# Patient Record
Sex: Female | Born: 1955 | Race: White | Hispanic: No | Marital: Married | Smoking: Current every day smoker
Health system: Southern US, Community
[De-identification: ages and names within clinical notes are randomized; demographics above are authoritative.]

## PROBLEM LIST (undated history)

## (undated) DIAGNOSIS — F319 Bipolar disorder, unspecified: Secondary | ICD-10-CM

## (undated) DIAGNOSIS — I509 Heart failure, unspecified: Secondary | ICD-10-CM

## (undated) DIAGNOSIS — I739 Peripheral vascular disease, unspecified: Secondary | ICD-10-CM

## (undated) DIAGNOSIS — I779 Disorder of arteries and arterioles, unspecified: Secondary | ICD-10-CM

## (undated) DIAGNOSIS — M1990 Unspecified osteoarthritis, unspecified site: Secondary | ICD-10-CM

## (undated) DIAGNOSIS — R001 Bradycardia, unspecified: Secondary | ICD-10-CM

## (undated) DIAGNOSIS — T148XXA Other injury of unspecified body region, initial encounter: Secondary | ICD-10-CM

## (undated) DIAGNOSIS — I10 Essential (primary) hypertension: Secondary | ICD-10-CM

## (undated) DIAGNOSIS — I639 Cerebral infarction, unspecified: Secondary | ICD-10-CM

## (undated) DIAGNOSIS — I517 Cardiomegaly: Secondary | ICD-10-CM

## (undated) DIAGNOSIS — R011 Cardiac murmur, unspecified: Secondary | ICD-10-CM

## (undated) DIAGNOSIS — Z950 Presence of cardiac pacemaker: Secondary | ICD-10-CM

## (undated) DIAGNOSIS — R928 Other abnormal and inconclusive findings on diagnostic imaging of breast: Secondary | ICD-10-CM

## (undated) DIAGNOSIS — Z1231 Encounter for screening mammogram for malignant neoplasm of breast: Secondary | ICD-10-CM

## (undated) DIAGNOSIS — Z780 Asymptomatic menopausal state: Secondary | ICD-10-CM

## (undated) DIAGNOSIS — Z13820 Encounter for screening for osteoporosis: Secondary | ICD-10-CM

## (undated) DIAGNOSIS — M810 Age-related osteoporosis without current pathological fracture: Secondary | ICD-10-CM

## (undated) DIAGNOSIS — F17210 Nicotine dependence, cigarettes, uncomplicated: Secondary | ICD-10-CM

## (undated) DIAGNOSIS — M25512 Pain in left shoulder: Principal | ICD-10-CM

## (undated) HISTORY — DX: Bradycardia, unspecified: R00.1

## (undated) HISTORY — DX: Cardiac murmur, unspecified: R01.1

## (undated) HISTORY — DX: Essential (primary) hypertension: I10

## (undated) HISTORY — DX: Disorder of arteries and arterioles, unspecified: I77.9

## (undated) HISTORY — DX: Bipolar disorder, unspecified: F31.9

## (undated) HISTORY — DX: Peripheral vascular disease, unspecified: I73.9

## (undated) HISTORY — DX: Unspecified osteoarthritis, unspecified site: M19.90

## (undated) HISTORY — DX: Other injury of unspecified body region, initial encounter: T14.8XXA

## (undated) HISTORY — DX: Presence of cardiac pacemaker: Z95.0

## (undated) HISTORY — DX: Cerebral infarction, unspecified: I63.9

## (undated) HISTORY — DX: Cardiomegaly: I51.7

---

## 1978-08-15 HISTORY — PX: CHOLECYSTECTOMY: SHX55

## 1981-08-15 HISTORY — PX: APPENDECTOMY: SHX54

## 1998-03-02 ENCOUNTER — Other Ambulatory Visit: Admission: RE | Admit: 1998-03-02 | Discharge: 1998-03-02 | Payer: Self-pay | Admitting: Family Medicine

## 1998-03-02 ENCOUNTER — Other Ambulatory Visit: Admission: RE | Admit: 1998-03-02 | Discharge: 1998-03-02 | Payer: Self-pay | Admitting: *Deleted

## 1998-05-17 ENCOUNTER — Emergency Department (HOSPITAL_COMMUNITY): Admission: EM | Admit: 1998-05-17 | Discharge: 1998-05-17 | Payer: Self-pay | Admitting: Emergency Medicine

## 1998-11-07 ENCOUNTER — Emergency Department (HOSPITAL_COMMUNITY): Admission: EM | Admit: 1998-11-07 | Discharge: 1998-11-07 | Payer: Self-pay | Admitting: Emergency Medicine

## 1998-11-07 ENCOUNTER — Encounter: Payer: Self-pay | Admitting: Emergency Medicine

## 1999-04-01 ENCOUNTER — Other Ambulatory Visit: Admission: RE | Admit: 1999-04-01 | Discharge: 1999-04-01 | Payer: Self-pay | Admitting: *Deleted

## 2000-03-17 ENCOUNTER — Emergency Department (HOSPITAL_COMMUNITY): Admission: EM | Admit: 2000-03-17 | Discharge: 2000-03-17 | Payer: Self-pay | Admitting: Emergency Medicine

## 2000-04-24 ENCOUNTER — Other Ambulatory Visit: Admission: RE | Admit: 2000-04-24 | Discharge: 2000-04-24 | Payer: Self-pay | Admitting: *Deleted

## 2000-07-10 ENCOUNTER — Ambulatory Visit (HOSPITAL_COMMUNITY): Admission: RE | Admit: 2000-07-10 | Discharge: 2000-07-10 | Payer: Self-pay | Admitting: Orthopedic Surgery

## 2000-07-10 ENCOUNTER — Encounter: Payer: Self-pay | Admitting: Orthopedic Surgery

## 2000-07-20 ENCOUNTER — Ambulatory Visit (HOSPITAL_COMMUNITY): Admission: RE | Admit: 2000-07-20 | Discharge: 2000-07-20 | Payer: Self-pay | Admitting: Orthopedic Surgery

## 2000-07-20 ENCOUNTER — Encounter: Payer: Self-pay | Admitting: Orthopedic Surgery

## 2001-03-07 ENCOUNTER — Ambulatory Visit (HOSPITAL_COMMUNITY): Admission: RE | Admit: 2001-03-07 | Discharge: 2001-03-07 | Payer: Self-pay | Admitting: Gastroenterology

## 2001-03-07 ENCOUNTER — Encounter (INDEPENDENT_AMBULATORY_CARE_PROVIDER_SITE_OTHER): Payer: Self-pay | Admitting: *Deleted

## 2001-06-11 ENCOUNTER — Other Ambulatory Visit: Admission: RE | Admit: 2001-06-11 | Discharge: 2001-06-11 | Payer: Self-pay | Admitting: Obstetrics and Gynecology

## 2001-08-15 HISTORY — PX: TOTAL KNEE ARTHROPLASTY: SHX125

## 2002-02-20 ENCOUNTER — Encounter: Payer: Self-pay | Admitting: Orthopedic Surgery

## 2002-02-25 ENCOUNTER — Inpatient Hospital Stay (HOSPITAL_COMMUNITY): Admission: RE | Admit: 2002-02-25 | Discharge: 2002-02-28 | Payer: Self-pay | Admitting: Orthopedic Surgery

## 2002-03-07 ENCOUNTER — Encounter: Admission: RE | Admit: 2002-03-07 | Discharge: 2002-03-07 | Payer: Self-pay | Admitting: Orthopedic Surgery

## 2002-03-07 ENCOUNTER — Encounter: Payer: Self-pay | Admitting: Orthopedic Surgery

## 2002-04-11 ENCOUNTER — Encounter: Admission: RE | Admit: 2002-04-11 | Discharge: 2002-05-06 | Payer: Self-pay | Admitting: Orthopedic Surgery

## 2003-03-20 ENCOUNTER — Other Ambulatory Visit: Admission: RE | Admit: 2003-03-20 | Discharge: 2003-03-20 | Payer: Self-pay | Admitting: Obstetrics & Gynecology

## 2004-01-03 ENCOUNTER — Ambulatory Visit (HOSPITAL_COMMUNITY): Admission: RE | Admit: 2004-01-03 | Discharge: 2004-01-03 | Payer: Self-pay | Admitting: Orthopedic Surgery

## 2004-03-24 ENCOUNTER — Ambulatory Visit (HOSPITAL_COMMUNITY): Admission: RE | Admit: 2004-03-24 | Discharge: 2004-03-24 | Payer: Self-pay | Admitting: Neurosurgery

## 2004-05-17 ENCOUNTER — Encounter: Admission: RE | Admit: 2004-05-17 | Discharge: 2004-07-05 | Payer: Self-pay | Admitting: Orthopedic Surgery

## 2004-09-10 ENCOUNTER — Encounter (INDEPENDENT_AMBULATORY_CARE_PROVIDER_SITE_OTHER): Payer: Self-pay | Admitting: *Deleted

## 2004-09-10 ENCOUNTER — Ambulatory Visit (HOSPITAL_COMMUNITY): Admission: RE | Admit: 2004-09-10 | Discharge: 2004-09-10 | Payer: Self-pay | Admitting: Gastroenterology

## 2004-10-28 ENCOUNTER — Inpatient Hospital Stay (HOSPITAL_COMMUNITY): Admission: EM | Admit: 2004-10-28 | Discharge: 2004-10-30 | Payer: Self-pay | Admitting: Emergency Medicine

## 2004-10-29 ENCOUNTER — Ambulatory Visit: Payer: Self-pay | Admitting: Internal Medicine

## 2004-10-29 ENCOUNTER — Encounter: Payer: Self-pay | Admitting: Internal Medicine

## 2005-01-01 HISTORY — PX: PERMANENT PACEMAKER INSERTION: SHX6023

## 2005-01-05 ENCOUNTER — Inpatient Hospital Stay (HOSPITAL_COMMUNITY): Admission: EM | Admit: 2005-01-05 | Discharge: 2005-01-08 | Payer: Self-pay | Admitting: Emergency Medicine

## 2005-01-06 HISTORY — PX: TEE WITHOUT CARDIOVERSION: SHX5443

## 2005-01-26 ENCOUNTER — Encounter: Admission: RE | Admit: 2005-01-26 | Discharge: 2005-01-26 | Payer: Self-pay | Admitting: *Deleted

## 2005-02-01 ENCOUNTER — Inpatient Hospital Stay (HOSPITAL_COMMUNITY): Admission: RE | Admit: 2005-02-01 | Discharge: 2005-02-02 | Payer: Self-pay | Admitting: *Deleted

## 2005-11-08 ENCOUNTER — Ambulatory Visit (HOSPITAL_COMMUNITY): Admission: RE | Admit: 2005-11-08 | Discharge: 2005-11-08 | Payer: Self-pay | Admitting: Gastroenterology

## 2007-11-07 ENCOUNTER — Emergency Department (HOSPITAL_COMMUNITY): Admission: EM | Admit: 2007-11-07 | Discharge: 2007-11-07 | Payer: Self-pay | Admitting: Emergency Medicine

## 2007-12-07 ENCOUNTER — Encounter: Admission: RE | Admit: 2007-12-07 | Discharge: 2007-12-07 | Payer: Self-pay | Admitting: Neurology

## 2008-09-15 DEATH — deceased

## 2010-12-31 NOTE — Discharge Summary (Signed)
NAMEMarland Kitchen  TEANNA, ELEM                ACCOUNT NO.:  [id number]   MEDICAL RECORD NO.:  [id number]          PATIENT TYPE:  INP   LOCATION:  3005                         FACILITY:  [facility]   PHYSICIAN:  Casimiro Needle L. Reynolds, M.D.DATE OF BIRTH:  [birth year]   DATE OF ADMISSION:  10/28/2004  DATE OF DISCHARGE:  10/30/2004                                 DISCHARGE SUMMARY   ADMISSION DIAGNOSES:  1.  Left hemi-numbness, dizziness, headache, possible stroke versus      migraine.  2.  Bipolar disorder.   DISCHARGE DIAGNOSES:  1.  Left-sided numbness and headache with negative magnetic resonance      imaging, most likely migraine versus possible tiny lacunar infarct      improved.  2.  Hypertension.  3.  Hyperhomocysteinemia.  4.  Bipolar disorder.   CONDITION ON DISCHARGE:  Improved.   DIET:  Low salt, low fat, low cholesterol.   ACTIVITY:  Ad lib.   MEDICATIONS ON DISCHARGE:  1.  Aspirin 325 mg daily (new).  2.  Norvasc 5 mg p.o. daily (new).  3.  Foltx one tablet a day (new).  4.  Lithium carbonate 300 mg p.o. b.i.d.  5.  Valium 2 mg q.8h. p.r.n.   STUDIES:  1.  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  2.  MRA of the intracranial circulation demonstrating mild to moderate      atherosclerotic changes, but no critical stenoses.  3.  MRA of the extracranial circulation demonstrating no significant lesions      in the carotid and vertebral arteries.  4.  A 2-D echocardiogram unremarkable.  5.  Laboratory review:  CBC remarkable for a mildly low hemoglobin and      hematocrit of 11.8 and 34.7, respectively.  Otherwise normal.  CMET      remarkable for a mildly low total protein and albumin, otherwise normal.      Lithium level 0.32.  Hemoglobin A1C 5.4.  Homocysteine elevated at      50.29.  Lipid panel with low HDL 32, elevated triglycerides, calculated      LDL in the normal range  at 76.  Telemetry demonstrates sinus      bradycardia.  EKG:  Sinus bradycardia, otherwise unremarkable.   HOSPITAL COURSE:  Please see admission H&P for full admission details.  Briefly, this is a 55 year old woman, who presented to the emergency room on  March 16 with hemi-numbness and also with a hemi-sensory deficit and was  admitted by Dr. Anne Hahn for concern of a possible stroke.  She was admitted  for a routine stroke workup which was done with the results as identified  above.  The patient's symptoms were improved while she was in the hospital  and by the morning of October 30, 2004 she had no significant complaints.  She  had a normal neurological examination.  She was placed on aspirin and  tolerated this well.  Blood pressures were elevated throughout her stay in  the  160-180 systolic range and she was initially placed on Altace which was  changed to Norvasc at discharge.  Heart rates were low throughout the  hospital stay, but she was not symptomatic with this.  She had no difficulty  with her bipolar disorder.  Her primary physician is Dr. Tiburcio Pea, but she  wished to change this at the time of discharge, so arrangements will be made  to set her up in the internal medicine residents' clinic.  On the basis of  the results of her stroke workup, she was also started on Foltx and  continued on aspirin and antihypertensive medications.  She was felt stable  for discharge home and was discharged on October 30, 2004.   FOLLOWUP:  She was advised to follow up with the internal medicine  residents' clinic in approximately one month and have asked her to call  [facility] at [phone] for an appointment with Dr. Pearlean Brownie  in two months.      MLR/MEDQ  D:  10/30/2004  T:  10/31/2004  Job:  [id number]

## 2010-12-31 NOTE — Op Note (Signed)
[place]  Patient:    Kellie Velez, Kellie Velez Visit Number: [id number] MRN: [id number]          Service Type: SUR Location: [id number] Attending Physician:  Georgena Spurling Dictated by:   Georgena Spurling, M.D. Proc. Date: 02/25/02 Admit Date:  02/25/2002                             Operative Report  SURGEON:  Georgena Spurling, M.D.  ASSISTANT:  Jamelle Rushing, P.A.  PREOPERATIVE DIAGNOSIS:  Left knee osteoarthritis.  POSTOPERATIVE DIAGNOSIS:  Left knee osteoarthritis.  PROCEDURE:  Left total knee arthroplasty.  ANESTHESIA:  General.  INDICATIONS FOR PROCEDURE: The patient is a 55 year old white female, status post failure of conservative measures and other treatment for osteoarthritis of the knee.  Informed consent was obtained.  DESCRIPTION OF PROCEDURE:  The patient was lain supine and administered general endotracheal anesthesia, and Foley catheter placement in the supine position. The left knee was then prepped and draped in the usual sterile fashion and the extremities exsanguinated and tourniquet elevated to 350 mmHg. A #10 blade was used to make a midline incision, and then a fresh blade was used to make a straight medium parapatellar arthrotomy.  I then dissected off the deep MCL of the proximal medial tibia and inverted the patella.  I performed a synovectomy in the suprapatellar pouch.  I measured the patella to be 22 mm.  I used a 29 mm reamer to ream down to 13.  I then used our 29 template and drilled three lug holes and with prosthetic trial in place, it also measured 22 mm.  I then went into flexion and cut the ACL and PCL so I could deliver the tibia anteriorly, and used an extramedullary alignment guide to pin the cutting block into place.  I then made a cut perpendicular to the anatomic axis of the tibia, removing 2 mm of bone off the medial side.  At this point, I turned attention to the femur where I made an intramedullary hole  and placed our intramedullary alignment guide and tamped it down to the distal aspect of the femur and placed the distal femoral cutting block and pinned it into place and removed our intramedullary alignment rod.  I then used the sagittal saw to make the distal femoral cut.  I then removed the distal femoral cutting guide.  I then measured the posterior columnar angle to be 3 degrees and used the sizing block which sized to a size D and pinned it through the 3-degree holes.  I then removed the sizing block and placed the cutter on and screwed it into place, removed the headless pins and made our anterior and posterior and chamfer cuts.  I then placed a laminar spreader in the lateral compartment and removed the medial meniscus, the posteromedial osteophytes, and ACL and PCL.  I then placed the lamina spreader in the medical compartment and removed the lateral meniscus, posterolateral columnar osteophytes, and further stripped the capsule. At this point, I freshened up the tibial cut posteriorly where there were some osteophytes and removed those osteophytes and used a 10 mm spacer block, which offered excellent flexion and extension gap balance.  I then finished the femur with a size D finishing block.  I finished the tibia with a size 4 tibial tray and then trial with a size 4 tibial tray and sized the femur, 10 mm polyethylene and  29 mm patella. We had excellent flexion and extension gap balance.  There was a slight lift off of the patella, and I did a very small lateral release, which corrected that.  At this point, I removed the trials and irrigated copiously and then submitted the tibia first, femur second, patella third, and then put the real polyethylene in place once we had removed all excess cement and let the cement harden in extension.  I then let the tourniquet down and cauterized all bleeding vessels.  I placed the Hemovac deep to the arthrotomy and closed the arthrotomy with  interrupted #1 Vicryl sutures.  I closed the deep soft tissues with interrupted 0 Vicryls and then a running subcuticular 2-0 Vicryl and skin staples.  The patient tolerated the procedure well.  COMPLICATIONS: None.  DRAINS:  None. Dictated by:   Georgena Spurling, M.D. Attending Physician:  Georgena Spurling DD:  02/25/02 TD:  02/26/02 Job: [id number]

## 2010-12-31 NOTE — H&P (Signed)
NAME:  Kellie Velez, Kellie Velez NO.:  [id number]   MEDICAL RECORD NO.:  [id number]          PATIENT TYPE:  INP   LOCATION:  1824                         FACILITY:  [facility]   PHYSICIAN:  Marlan Palau, M.D.  DATE OF BIRTH:  08/10/1956   DATE OF ADMISSION:  10/28/2004  DATE OF DISCHARGE:                                HISTORY & PHYSICAL   HISTORY OF PRESENT ILLNESS:  Kellie Velez is a 55 year old, right handed,  white female, born 1956-01-29, with a history of obesity, bipolar  disorder, migraine headache.  This patient comes to [facility] at  this point after noting onset of some problems with swelling in the feet  beginning about 4-5 days ago.  Within the last two days, the patient has  developed an occipital headache associated with dizziness that she describes  as a blend between vertigo and lightheaded floating feeling.  The patient  has had nausea but no vomiting with this.  Along with this, the patient has  been staggering, feels at times as if the left leg is going to give way.  The patient today noted onset of severe dizziness and onset of left sided  sensory changes involving the face, arm, and leg.  The patient became  concerned and sought medical attention.  The patient denied any visual  changes such as double vision, loss of vision, denied any changes in speech  or swallowing, although chronically she notes that if she tilts her head  back to swallow something she may choke with this maneuver.  The patient  again has not had any slurred speech or aphasia symptoms.  The patient does  not really believe there has been any true weakness of the extremities.  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  No acute changes are seen.  Neurology is asked to see this patient for  further evaluation.   PAST MEDICAL HISTORY:  1.  New onset  dizziness, gait instability, left hemi sensory deficit as      above.  2.  History of migraine.  3.  Bipolar disorder.  4.  History of obesity.  5.  Irritable bowel syndrome.  6.  Hiatal hernia.  7.  Ulcerative colitis.  8.  Gout.  9.  Gallbladder resection.  10. Appendectomy.  11. Status post left total knee replacement.  12. History of left knee arthroscopic surgery in the past.  13. History of left shoulder surgery.  14. History of right carpal tunnel syndrome surgery.  15. History of cervical spondylosis, followed by Dr. Franky Macho.   CURRENT MEDICATIONS:  1.  Diazepam 10 mg three times a day if needed.  2.  Lithium 300 mg b.i.d.   The patient smokes a half pack of cigarettes daily.  Does not drink alcohol.  The patient reports no history of hypertension but comes into the ER today  with a blood pressure of 204/78.   No known allergies.  SOCIAL HISTORY:  The patient is married, lives in the [place] area, has no children, is on disability.   FAMILY MEDICAL HISTORY:  Notable that mother died with an abdominal aortic  aneurysm.  Father died with pulmonary disease.  The patient has three  brothers that are living and one with heart disease, one with a problem with  hernia, obesity, heart disease.  One brother who is alive and well.  The  patient has one brother that died with pneumonia.   REVIEW OF SYSTEMS:  Noted for occasional choking as above, headache, slight  shortness of breath, denies chest pains, occasional heartburn is noted.  The  patient has some stress incontinence of the bladder, has episodes of near  syncope but no black out episodes.  No history of seizures.   PHYSICAL EXAMINATION:  VITAL SIGNS:  Blood pressure is initially 204/78,  repeat is 193/78, heart rate 53, respiratory rate 16, temperature afebrile.  GENERAL:  This patient is a markedly obese white female who is alert and  cooperative at the time of examination.  HEENT:  Head  is atraumatic.  Eyes, pupils are equal, round, reactive to  light.  Disks are flat bilaterally.  NECK:  Supple.  No carotid bruits noted.  RESPIRATORY:  Clear.  CARDIOVASCULAR:  Reveals a regular rate and rhythm.  No obvious murmurs rubs  noted.  EXTREMITIES:  Notable for 2 to 3+ edema.  ABDOMEN:  Reveals an obese abdomen.  No organomegaly or tenderness noted.  NEUROLOGIC:  Cranial nerves as above.  Facial symmetry is present.  The  patient notes decreased pinprick sensation on the left face compared to the  right.  Does not split the midline with vibratory sensation_  on the face.  Extraocular movements again are full.  Visual fields are full.  Speech is  well enunciated, not aphasic.  Motor testing reveals good strength on all  four extremities.  Good symmetric motor tone is noted throughout.  Sensory  testing reveals a decreased in pinprick sensation on the left arm left leg  as compared to the right.  Vibratory sensation is more symmetric.  The  patient has good finger-to-nose-finger and toe-to-finger bilaterally.  The  patient was not ambulated.  Deep tendon reflexes were relatively symmetric  throughout.  Toes were neutral bilaterally.  No drift is seen.   LABORATORY VALUES:  Notable for a white count of 7.6, hemoglobin of 11.8,  hematocrit of 34.7, MCV of 79.4, platelets of 202.  Sodium 138, potassium  4.1, chloride 109, CO2 of 25, glucose of 93, BUN of 7, creatinine 1.1,  calcium 8.9.  Total protein 5.9, albumin of 3.3, AST 18, ALT 17, alkaline  phosphatase 64, total bili 0.5.  Urinalysis reveals a specific gravity of  1.007, pH of 6.0, otherwise unremarkable.  Lithium level was 0.32.  A CT of  the head is as above.  Chest x-ray, EKG are pending at this time.   IMPRESSION:  1.  New onset of left hemi sensory deficit, dizziness, headache, rule out      cerebrovascular infarction or a complicated migraine.  2.  Bipolar disorder. 3.  Obesity.   This patient does have an  elevated blood pressure at this point but claims  to have no history of hypertension.  We will need to follow blood pressures,  may add Altace at low dose at this time.  The patient will be placed on  aspirin and followed during this hospitalization.  We  will admit for further  evaluation.   PLAN:  1.  MRI scan of the brain.  2.  MRA angiogram.  3.  A 2D echocardiogram.  4.  Aspirin therapy.  5.  Physical therapy for gout.  6.  Altace treatment.      CKW/MEDQ  D:  10/28/2004  T:  10/28/2004  Job:  [id number]   cc:   Juluis Mire, M.D.  [address]  [place]  [place] [address]  Fax: [phone]

## 2010-12-31 NOTE — Discharge Summary (Signed)
NAME:  Kellie Velez, Kellie Velez                            ACCOUNT NO.:  [id number]   MEDICAL RECORD NO.:  [id number]                   PATIENT TYPE:  REC   LOCATION:  [facility]H                                 FACILITY:  [facility]   PHYSICIAN:  Mila Homer. Sherlean Foot, M.D.              DATE OF BIRTH:  1956-02-06   DATE OF ADMISSION:  04/11/2002  DATE OF DISCHARGE:                                 DISCHARGE SUMMARY   DATE OF ADMISSION:  02/25/2002   DATE OF DISCHARGE:  02/28/2002   ADMISSION DIAGNOSES:  1. Left knee osteoarthritis.  2. Bipolar manic depression.  3. Ulcerative colitis.  4. Irritable bowel syndrome.  5. Hiatal hernia.   DISCHARGE DIAGNOSES:  1. Left total knee arthroplasty.  2. Postoperative blood loss anemia, asymptomatic.  3. Bipolar manic depression.  4. Ulcerative colitis.  5. Irritable bowel syndrome.  6. Hiatal hernia.   HISTORY OF PRESENT ILLNESS:  The patient is a 55 year old white female with  about a 15 year history of left knee problems starting with a torn  cartilage.  The patient has had four arthroscopic procedures with good short  term improvement but over the last few years the pain has been increasing  with activity.  The pain is currently severe with any type of range of  motion and weight bearing activity.  She does have night pain.  She  describes the pain as a sharp stabbing pain with radiation up and down the  leg.  She does have swelling, grinding, popping and giving out.  X-rays  revealed severe tricompartmental osteoarthritis.   ALLERGIES:  PENICILLIN AND DEMEROL.   CURRENT MEDICATIONS:  1. Lithium 300 mg p.o. t.i.d.  2. Darvocet p.r.n.  3. Alprazolam 0.25 mg p.o. q. 6 hours p.r.n.  4. Protonix 40 mg p.o. q.a.m.   SURGICAL PROCEDURE:  On 02/25/2002 the patient was taken to the operating  room by Dr. Georgena Spurling, assisted by Arlyn Leak, P.A.C.  Under general  anesthesia the patient underwent a left total knee arthroplasty.  The  patient tolerated the  procedure well.  One Hemovac drain was left in place.  There were no complications.  The patient received a postoperative femoral  nerve block for assistance in pain control.   HOSPITAL COURSE:  On 02/25/2002 the patient was admitted to [facility] under the care of Dr. Georgena Spurling.  The patient was taken to the  operating room where a left total knee arthroplasty was performed.  The  patient tolerated the procedure well, had a postoperative femoral nerve  block given and was placed on Lovenox for routine deep venous thrombosis  prophylaxis.   The patient then incurred a total of three days of postoperative care in  which he did develop some asymptomatic postoperative blood loss anemia and  did not require a transfusion.  The patient worked well with physical  therapy.  Her vital signs  remained stable.  She remained afebrile.  Her leg  remained neuromotor vascularly intact.  Her wound remained benign.  On  postoperative day number three the patient was bending her knee 0-85 degrees  and she was felt to be orthopaedically stable and ready for discharge to  home.  Arrangements were made and she was discharged in good condition.   LABORATORY DATA:  Electrocardiogram on admission was sinus bradycardia at 50  beats per minute.   02/28/2002.  WBC 9.6, hemoglobin 8.3, hematocrit 25.0, platelets 184,000.   02/27/2002.  Routine chemistries.  Sodium 137, potassium 3.8, glucose 145,  BUN 6, creatinine 0.8.   Routine urinalysis on admission was normal.   DISCHARGE MEDICATIONS FROM ORTHOPAEDIC FLOOR:  1. Colace 100 mg p.o. b.i.d.  2. Trinsicon one tablet p.o. t.i.d.  3. Lithium carbonate 300 mg p.o. t.i.d.  4. Protonix 40 mg p.o. q.d.  5. Lovenox 30 mg subcutaneously q. 12 hours.  6. Nicotine patch 21 mg per 24 hours.  7. Laxative or enema of choice p.r.n.  8. Percocet one to two tablets q. 4-6 hours p.r.n.  9. Tylenol 650 mg p.o. q. 4 hours p.r.n.  10.      Robaxin 500 mg p.o.  q. 6 hours p.r.n.  11.      Alprazolam 0.25 mg p.o. q. 6 hours p.r.n.   DISCHARGE INSTRUCTIONS:  MEDICATIONS:  The patient is to resume routine home  medications.  1. Vicodin 5 mg one to two tablets q. 4-6 hours for pain if needed.  2. Trinsicon one tablet with meals until gone.  3. Aspirin 325 mg daily for one month.  4. Vicodin for pain control.  ACTIVITY:  As tolerated with the use of a walker.  DIET:  No restrictions.  WOUND CARE:  Keep wound clean.  Check daily for any signs of infection.  If  there are any questions call Dr. Tobin Chad office.  FOLLOW UP:  The patient is to have a follow up appointment with Dr. Sherlean Foot in  ten days.   CONDITION ON DISCHARGE:  Improved and good.      Jamelle Rushing, P.A.                      Mila Homer. Sherlean Foot, M.D.   RWK/MEDQ  D:  04/15/2002  T:  04/16/2002  Job:  [id number]

## 2010-12-31 NOTE — Discharge Summary (Signed)
NAMEMarland Kitchen  Kellie, Velez                ACCOUNT NO.:  [id number]   MEDICAL RECORD NO.:  [id number]          PATIENT TYPE:  INP   LOCATION:  3003                         FACILITY:  [facility]   PHYSICIAN:  Genene Churn. Love, M.D.    DATE OF BIRTH:  Feb 24, 1956   DATE OF ADMISSION:  01/04/2005  DATE OF DISCHARGE:  01/08/2005                                 DISCHARGE SUMMARY   This is one of several [facility] admissions for this 55 year old,  right-handed, white, married female from [place], [place] admitted  on Jan 04, 2005, by Dr. Orlin Hilding to evaluate headache, blurred vision, right  arm and leg shaking without loss of consciousness.   HISTORY OF PRESENT ILLNESS:  Kellie Velez has a known history of obesity,  bipolar disorder, migraine headaches and hypertension.  She was admitted to  [facility] on October 28, 2004 because of the acute onset of problems  with swelling in her feet, vague dizziness with headache, vertigo and a  lightheaded sensation.  She had some left-sided sensory changes involving  her face, arm and leg.  Her neurologic examination at the time of admission  was unremarkable and laboratory studies in that hospitalization revealed  normal blood studies, chest x-ray and EKG.  She underwent an MRI study of  the brain which showed no acute abnormality and an old infarct in the left  subcortical white matter with an MRA of the intracranial circulation and  mild to moderate atherosclerotic changes, but no critical stenoses.  MRA of  the extracranial circulation demonstrated no significant lesions.  A 2-D  echocardiogram was unremarkable.  Her hemoglobin A1C was 5.4.  Homocysteine  level was high at 50.29.  A lipid panel revealed a low HDL of 32, elevated  triglycerides and LDL in the normal range at 76.  Her telemetry showed sinus  bradycardia, but otherwise was unremarkable.  Her heart rates were low  during that hospitalization.  Her primary care physician was  Dr. Tiburcio Pea who  did not come to the hospital.  She was discharged on Foltx, aspirin and  placed on antihypertensive medications at the time.  She was doing well  until 01/04/05 when she had a 30 minute episode of right-sided headache,  blurred vision, right arm and leg shaking without loss of consciousness.  She came to the emergency room for further evaluation.   PAST MEDICAL HISTORY:  1.  Documented cerebrovascular disease by MRI with previous left brain      subcortical white matter lesions.  2.  Hypertension.  3.  Obesity.  4.  Hyperhomocysteinemia.  5.  Bipolar disorder.   ADMISSION MEDICATIONS:  1.  Aspirin 325 mg daily.  2.  Foltx 1 daily.  3.  Lithium 300 mg b.i.d.  4.  Norvasc, dosage unknown.  5.  Xanax 0.25 mg q.h.s.  6.  Intermittent Valium.   ALLERGIES:  She has a questionable allergy to [facility].   PHYSICAL EXAMINATION:  GENERAL:  Unremarkable.  She had a previous right  knee surgical procedure.   CT scan showed an old cerebellar infarct.  Old  left frontal subcortical  white matter lesion.   LABORATORY DATA:  Lithium level of 0.51 with therapeutic values of 0.88 to  1.40, TSH 2.515.  White blood cell count 8,900, hemoglobin 13.3, hematocrit  39.2, platelet count 235,000.  The differential was 57% polys, 32%  lymphocytes, 4% monocytes, 7% eosinophils, 0% basophils.  Sodium 137,  potassium 4.0, chloride 111.  CO2 content 24, glucose 93, BUN 11, creatinine  1.1, calcium 9.3, total protein 6.2.  Albumin 3.7, AST 28, ALT 27, ALP 58,  total bilirubin 0.5.   Her telemetry in the hospital showed sinus bradycardia with first degree  heart-block.  A transesophageal echocardiogram Jan 06, 2005 showed mild  atherosclerosis of the descending aorta, no evidence of patent foramen  ovale, no evidence of cardiac source present.  Her MRI study of the brain  showed positive lesion in the left centrum semiovale for an acute stroke  next to the old area.  There was no shaking of the  right side of her body in  the hospital.  She had an EEG however, which showed some mild diffuse  slowing.   HOSPITAL COURSE:  The patient was seen in consultation because of sinus  bradycardia by cardiology, Dr. Zeb Comfort who felt that there was no obvious  cause of dizziness present from her heart.  There were no rate slowing  agents on board and felt that the Lithium that she had been on since the  1980s may be causing the bradycardia, but at this time felt that she was  quite functional and that she would not need pacemaker.  She was continued  on aspirin in the hospital.  She complained of some generalized shaking in  the hospital.   ASSESSMENT:  1.  Left brain stroke, 434.01.  2.  Old left brain stroke by previous magnetic resonance imaging and      magnetic resonance imaging on this admission, 434.01.  3.  Hypertension, 796.2.  4.  Obesity, 278.01.  5.  Bipolar disorder, 296.7.  6.  Hyperhomocysteinemia.   DISCHARGE MEDICATIONS:  1.  Norvasc 5 mg daily.  2.  Lithium carbonate 300 mg b.i.d.  3.  Foltx 1 tablet daily.  4.  Aspirin 325 mg daily.  5.  Xanax 0.25 mg daily p.r.n.   ACTIVITY:  She is not to drive a car for 3 weeks.   FOLLOWUP:  She will return to see Dr. Pearlean Brownie in 2 months.  She is to return  to Dr. Aida Puffer[place].  She  will return to the cardiologist p.r.n. difficulties.      JML/MEDQ  D:  01/08/2005  T:  01/08/2005  Job:  [id number]   cc:   Aida Puffer  [address]  [place]  [place] [address]  Fax: [phone]   [facility]  [place], [place]

## 2010-12-31 NOTE — Cardiovascular Report (Signed)
NAMEMarland Kitchen  Kellie Velez, Kellie Velez                ACCOUNT NO.:  [id number]   MEDICAL RECORD NO.:  [id number]          PATIENT TYPE:  INP   LOCATION:  2866                         FACILITY:  [facility]   PHYSICIAN:  Darlin Priestly, MD  DATE OF BIRTH:  09/26/1955   DATE OF PROCEDURE:  02/01/2005  DATE OF DISCHARGE:                              CARDIAC CATHETERIZATION   PROCEDURES:  Insertion of a Medtronic Enrhythm generator, P1501DR, Serial  Number [id number] with passive atrial and ventricular leads.   ATTENDING PHYSICIAN:  Darlin Priestly, MD.   COMPLICATIONS:  None.   INDICATIONS:  Kellie Velez is a 55 year old female patient of Dr. Lavonne Chick  with a history of CVA, hypertension, history of tobacco use, history of  bipolar disorder on Lithium for approximately 19 years.  She has had chronic  bradycardia which has not become symptomatic.  She has had several  presyncopal episodes with documented heart rate into the 30s.  She is now  here for dual-chamber pacer implant.   DESCRIPTION OF PROCEDURE:  After informed consent, the patient was brought  to the cardiac catheterization lab in the fasting state.  Left anterior  chest was then prepped and draped in sterile fashion.  ECG monitor  established; 1% lidocaine was then used to anesthetize just beneath the left  clavicle in the mid clavicular line.  Approximately 2.5 to 3 cm horizontal  incision was then carried out beneath the left clavicle, and hemostasis was  obtained with electrocautery.  Blunt dissection was carried down to the left  pectoral fascia.  Approximately 3 x 4 cm pocket was then created over the  left subpectoral fascia. The left subclavian vein was then easily entered  under fluoroscopic guidance, and guidewire was easily passed into the SVC  and right atrium.  Next, a 9-French dilator and sheath were then tracked  over the retained guidewire, and the guidewire and dilator were then  removed.  Following this, a 58 cm Medtronic  passive lead, Model Number [id number],  Serial Number [id number] was then easily passed into the right atrium.  Guidewire was retained, peelaway sheath removed.  A second 9-French dilator  and sheath were then tracked over the retained guidewire, and the dilator  and guidewire were removed.  A 53 cm passive atrial lead, Model Number 5594,  Serial Number [id number] were then easily passed into the right atrium.  Guidewire was retained and peelaway sheath removed.  The guidewire was then  passed through the sheath with a hemostat.  A J curve was then placed  ventricular lead stylette, and the ventricular lead was then allowed to  prolapse the tricuspid valve.  The ventricular lead was then positioned in  the RV apex. Threshold was determined.  R-wave was measure at 15 mV.  Threshold in the ventricle was 0.4 at 0.5 msec.  Current was 0.6 mA.  Impedance was 846 ohms.  The J curve was then allowed to form on the atrial  lead, and the atrial lead was positioned in the right atrial appendage.  Threshold was determined.  P-wave was measured at 6.1 mV.  Impedance was 548  ohms.  Pressure on the ventricle was 1 volt at 0.5 msec.  Current 1.9 mA; 10  volts were negative in the left atrium and ventricular lead.  Two silk  sutures were then placed on each lead.  The pocket was copiously irrigated  with 1% kanamycin solution.  The leads were then connected in serial fashion  to a Medtronic Enrhythm P1501DR generator, Serial Number [id number].  Head  screws were tightened, and patient was confirmed.  A single silk suture was  then placed at the apex of the pocket.  The generator and leads were  delivered into the pocket, and the generator was then secured via the header  to the silk suture.  The pocket subcutaneous layer was then closed using  running 2-0 Vicryl.  Skin layers were then closed with running 4-0 Vicryl.  Steri-Strips were applied.  The patient was transferred to the recovery room  in stable  condition.   CONCLUSION:  Successful implant of a Medtronic Enrhythm [id number] generator,  Serial Number [id number] with passive atrial and ventricular leads.       RHM/MEDQ  D:  02/01/2005  T:  02/01/2005  Job:  [id number]   cc:   Madaline Savage, M.D.  [address]  [place]  [place] [address]  Fax: [phone]   Aida Puffer, M.D.

## 2010-12-31 NOTE — H&P (Signed)
NAMEMarland Kitchen  Velez, Kellie Velez                ACCOUNT NO.:  [id number]   MEDICAL RECORD NO.:  [id number]          PATIENT TYPE:  INP   LOCATION:  3003                         FACILITY:  [facility]   PHYSICIAN:  Kellie Velez, M.D.DATE OF BIRTH:  29-Jan-1956   DATE OF ADMISSION:  01/04/2005  DATE OF DISCHARGE:                                HISTORY & PHYSICAL   CHIEF COMPLAINT:  Shaking right side.   HISTORY OF PRESENT ILLNESS:  Kellie Velez is a 55 year old, right handed,  white woman, discharged from stroke service two months ago, on October 30, 2004, with a diagnosis of possible stroke versus migraine, after she was  admitted with left sided numbness and headache and vision blurring.  Her MRI  was negative during that hospital stay however.  She is on Lithium, aspirin,  Norvasc.  Today she had a 30-minute episode of right sided headache, blurred  vision, and right arm and leg shaking without loss of consciousness.  She  called Kellie Velez' office whom she considers her neurologist, although she  has also been seen by Dr. Pearlean Velez and Dr. Thad Velez over there and she was told  to come to the emergency room.  She has been here about seven hours before I  was called.  She just saw Dr. Pearlean Velez last week and was essentially released  from his care but told to follow up with Kellie Velez regarding the headaches.   REVIEW OF SYSTEMS:  As above.  No weakness.   PAST MEDICAL HISTORY:  1.  Cerebrovascular disease by MR without any ever clear cut stroke      clinically.  2.  She does have a known left subcortical white matter lesion which is not      acute on October 29, 2004.  3.  She is hypertensive.  4.  She has obesity.  5.  Homocystinemia.  6.  Bipolar disorder.   MEDICATIONS:  Aspirin, Norvasc, Foltx, Lithium, and sometimes Valium.   ALLERGIES:  Questionably to FLOXIN.   SOCIAL HISTORY:  She does smoke.  No alcohol.   FAMILY HISTORY:  Noncontributory.   PHYSICAL EXAMINATION:  VITAL SIGNS:   Temperature is 97.7, blood pressure  180/100 which came down to 149/72.  Pulse has been somewhat bradycardic in  the 40s to 50s.  Respiratory rate is 18.  HEAD:  Normocephalic, atraumatic.  NECK:  Without bruits.  HEART:  Regular rate and rhythm.  ABDOMEN:  Obese.  MENTAL STATUS:  She is awake, alert, with normal language and cognition.  CRANIAL NERVES:  Pupils equal and reactive.  Visual fields are full.  Extraocular movements are intact.  Facial sensation is normal.  Facial motor  activity is normal.  Hearing is intact.  Palate symmetric and tongue is  midline.  MOTOR:  She has no drift in the upper or lower extremities.  She has normal  bulk, tone, and strength throughout.  No fasciculations, atrophy, tremor.  Deep tendon reflexes are 2+ with downgoing toes.  Plantar stimulation,  coordination, finger-to-nose, heel-to-shin are normal.  I did not have  ambulate.  Did not check  tandem gait.  SENSORY:  Negative.   CT of the brain shows an old left cerebellar infarct and an old left frontal  subcortical white matter lesion, nothing acute.   LABS:  Unremarkable.   IMPRESSION:  Right body shaking in a patient with a previous history of left  brain stroke, a recent admission with left body symptoms but negative workup  at that time with headache and it was questioned if she had complex  migraine.  She also has a history of bipolar.  We need to consider a  seizure, such as an anniversary seizure.  It is very atypical for a migraine  at this time.  The unilateral shaking would be unusual for Lithium toxicity.   PLAN:  1.  Admit for observation.  2.  Recheck an MRI scan of the brain.  3.  Check an EEG.  4.  Check a Lithium level.       CAW/MEDQ  D:  01/05/2005  T:  01/05/2005  Job:  [id number]

## 2010-12-31 NOTE — Procedures (Signed)
REQUESTING PHYSICIAN:  Gustavus Messing. Orlin Hilding, M.D.   CLINICAL HISTORY:  A 55 year old woman with history of reported seizure  yesterday. EEG performed for evaluation.   DESCRIPTION:  The dominant rhythm of this tracing is a moderate to high  amplitude theta rhythm of 6 to 7 hertz which appears posteriorly with spread  into temporal areas. Low amplitude fast activity is seen frontally and  centrally and appears without abnormal asymmetry. No focal slowing is noted  and no epileptiform discharges are seen. The patient appeared to remain in  the awake state throughout the recording. Photic stimulation produced weak  driving responses. Hyperventilation was not performed. Single channel  devoted to EKG revealed sinus bradycardia throughout with rate of  approximately 42 to 48 beats per minute.   CONCLUSION:  Abnormal study to the presence of:  1.  Moderate diffuse slowing of the background rhythms. Findings suggestive      of diffuse widespread cerebral function.  2.  Marked sinus bradycardia.      EAV:WUJW  D:  01/06/2005 18:43:52  T:  01/07/2005 07:12:39  Job #:  [id number]

## 2010-12-31 NOTE — Procedures (Signed)
[place]  Patient:    Kellie Velez, Kellie Velez                         MRN: [id number] Proc. Date: 03/07/01 Attending:  Anselmo Rod, M.D. CC:         Juluis Mire, M.D.   Procedure Report  DATE OF BIRTH:  [birth year]  REFERRING PHYSICIAN:  Juluis Mire, M.D.  PROCEDURE PERFORMED:  Esophagogastroduodenoscopy with biopsies.  ENDOSCOPIST:  Anselmo Rod, M.D.  INSTRUMENT USED:  Olympus video panendoscope.  INDICATIONS FOR PROCEDURE:  Severe epigastric pain in a 55 year old white female, rule out peptic ulcer disease, esophagitis, gastritis, etc.  PREPROCEDURE PREPARATION:  Informed consent was procured from the patient. The patient was fasted for eight hours prior to the procedure.  PREPROCEDURE PHYSICAL:  The patient had stable vital signs.  Neck supple. Chest clear to auscultation.  S1, S2 regular.  Abdomen soft with epigastric tenderness on palpation with guarding, no rebound, no rigidity.  DESCRIPTION OF PROCEDURE:  The patient was placed in left lateral decubitus position and sedated with 75 mg of Demerol and 10 mg of Versed intravenously. Once the patient was adequately sedated and maintained on low-flow oxygen and continuous cardiac monitoring, the Olympus video panendoscope was advanced through the mouthpiece, over the tongue, into the esophagus under direct vision.  The entire esophagus appeared normal without evidence of ring, stricture, masses, lesions, esophagitis or Barretts mucosa.  The scope was then advanced to the stomach.  There was severe diffuse gastritis throughout the gastric mucosa.  Biopsies were done to rule out presence of Helicobacter pylori by pathology.  The proximal small bowel appeared normal as well.  There was no outlet obstruction.  IMPRESSION: 1. Severe diffuse gastritis. 2. Normal-appearing esophagus and proximal small bowel. 3. Gastric biopsies done, results pending.  RECOMMENDATION: 1.  The patient has been given samples of Protonix from the office 40 mg    1 p.o. q.d. 2. Treat with antibiotics if Helicobacter pylori present. 3. Proceed with colonoscopy at this time. 4. Avoid all nonsteroidals including aspirin. DD:  03/07/01 TD:  03/08/01 Job: [id number]

## 2010-12-31 NOTE — Discharge Summary (Signed)
NAMEJILLANA, SELPH                ACCOUNT NO.:  [id number]   MEDICAL RECORD NO.:  [id number]          PATIENT TYPE:  INP   LOCATION:  3711                         FACILITY:  [facility]   PHYSICIAN:  Darlin Priestly, MD  DATE OF BIRTH:  Jan 18, 1956   DATE OF ADMISSION:  02/01/2005  DATE OF DISCHARGE:  02/02/2005                                 DISCHARGE SUMMARY   ADMITTING DIAGNOSES:  1.  Bradycardia.  2.  Bipolar disease on Lithium.  3.  Hypertension.  4.  Tobacco use.  5.  History of cerebrovascular accident.  6.  Hypohomocysteinemia now being treated.  7.  Multiple surgeries in the past.  8.  Ulcerative colitis.  9.  Questionable seizure disorder.   DISCHARGE DIAGNOSES:  1.  Bradycardia.  2.  Bipolar disease on Lithium.  3.  Hypertension.  4.  Tobacco use.  5.  History of cerebrovascular accident.  6.  Hypohomocysteinemia now being treated.  7.  Multiple surgeries in the past.  8.  Ulcerative colitis.  9.  Questionable seizure disorder.   PROCEDURES:  Insertion of Medtronic EnRhythm generator [id number] serial  [id number] with passive atrial and ventricular leads.   BRIEF HISTORY:  Patient is a 55 year old white female who was referred to [place]  by Dr. Melbourne Abts with a history of left brain CVA.  We were asked to see her  secondary to bradycardia which drops down into the upper 30s.  She would  walk around and come back up into the 40s.  She has a history of bipolar  disease.  Has been on Lithium for 19 years.  She underwent a 2-D  transesophageal echocardiogram which showed no evidence of a patent foramen  ovale.  EF was 60%.  Otherwise, she had normal valves.  __________.  She is  subsequently scheduled at this time for elective permanent transvenous  pacemaker.   PAST MEDICAL HISTORY:  1.  CVA.  2.  Hypertension.  3.  Ongoing tobacco use.  4.  Bipolar disease.  5.  History of ulcerative colitis and GERD.  6.  Degenerative disk disease in the neck and the spine.  7.   Status post left knee replacement.  8.  History of cholecystectomy, appendectomy.  9.  Obesity.   MEDICATIONS ON ADMISSION:  1.  Lithium 300 mg b.i.d.  2.  Norvasc 5 mg daily.  3.  Aspirin 325 mg daily.  4.  Folic acid one every other day.   ALLERGIES:  FLOXIN.   For further history and physical please see the dictated note.   HOSPITAL COURSE:  Patient was admitted, taken to catheterization laboratory.  Underwent placement of permanent transvenous pacemaker.  She tolerated  procedure well.  She was seen the following day by Dr. Jenne Campus.  She was  pacing and sensing appropriately.  It was his opinion she was doing well and  she was discharged home.   DISCHARGE MEDICATIONS:  1.  Lithium 300 mg b.i.d.  2.  Norvasc 5 mg daily.  3.  Aspirin 325 mg daily.  4.  Folic acid __________.  5.  She  was also given Tylenol 325 mg two or three as needed for pain every      four hours.   DISCHARGE ACTIVITIES:  Light to moderate.  Will have her come back next week  to check her wound and she will go back on routine schedule for pacer follow-  up and routine follow-up with Dr. Elsie Lincoln.   Please note, no laboratories were done during her hospitalization except for  a UA which was normal.       WDJ/MEDQ  D:  02/02/2005  T:  02/02/2005  Job:  [id number]   cc:   Madaline Savage, M.D.  [address]  [place]  [place] [address]  Fax: [phone]   Aida Puffer  [address]  [place]  [place] [address]  Fax: [phone]   Genene Churn. Love, M.D.  [address]  [place]  [place] [address]  Fax: [phone]

## 2010-12-31 NOTE — Procedures (Signed)
[place]  Patient:    Kellie Velez, Kellie Velez                         MRN: [id number] Proc. Date: 03/07/01 Attending:  Anselmo Rod, M.D. CC:         Juluis Mire, M.D.   Procedure Report  DATE OF BIRTH:  28-Jan-1956  REFERRING PHYSICIAN:  Juluis Mire, M.D.  PROCEDURE PERFORMED:  Colonoscopy with biopsies.  ENDOSCOPIST:  Anselmo Rod, M.D.  INSTRUMENT USED:  Olympus pediatric video colonoscope.  INDICATIONS FOR PROCEDURE:  Rectal bleeding with history of severe diarrhea and a previous diagnosis of ulcerative colitis in a 55 year old white female who has been having diffuse abdominal pain with diarrhea.  Rule out ulcerative colitis flare.  PREPROCEDURE PREPARATION:  Informed consent was procured from the patient. The patient was fasted for eight hours prior to the procedure and prepped with a bottle of magnesium citrate and a gallon of NuLytely the night prior to the procedure.  PREPROCEDURE PHYSICAL:  The patient had stable vital signs.  Neck supple. Chest clear to auscultation.  S1, S2 regular.  Abdomen soft with normal abdominal bowel sounds.  DESCRIPTION OF PROCEDURE:  The patient was placed in the left lateral decubitus position and sedated with an additional 25 mg of Demerol.  Once the patient was adequately sedated and maintained on low-flow oxygen and continuous cardiac monitoring, the Olympus video colonoscope was advanced from the rectum to the cecum with considerable difficulty secondary to a large amount of residual stool in the colon.  There was patchy erythema over the ileocecal valve and at 20 cm this was biopsied for pathology.  The findings on visual inspection of the colonic mucosa did not seem consistent with ulcerative colitis.  However, biopsies are pending.  No masses, polyps, diverticulosis or erosions were seen.  The patient had otherwise healthy-appearing colon except for the two areas mentioned  above.  IMPRESSION: 1. Patchy erythema on ileocecal valve and at 20 cm biopsied for pathology.    Results pending. 2. Large amount of residual stool in the colon.  Small lesions could have been    missed.  RECOMMENDATIONS: 1. Await pathology results. 2. Avoid all nonsteroidals. 3. Outpatient follow-up in the next two weeks.DD:  03/07/01 TD:  03/08/01 Job: [id number]

## 2010-12-31 NOTE — Cardiovascular Report (Signed)
NAME:  Kellie Velez, Kellie Velez                ACCOUNT NO.:  [id number]   MEDICAL RECORD NO.:  [id number]          PATIENT TYPE:  INP   LOCATION:  3003                         FACILITY:  [facility]   PHYSICIAN:  Darlin Priestly, MD  DATE OF BIRTH:  04-26-1956   DATE OF PROCEDURE:  01/06/2005  DATE OF DISCHARGE:                              CARDIAC CATHETERIZATION   PROCEDURE:  Transesophageal echocardiogram.   ATTENDING PHYSICIAN:  Darlin Priestly, MD.   COMPLICATIONS:  None.   INDICATION:  Ms.  Niesen is a 55 year old female patient of Dr. Aida Puffer with a history of bipolar disorder, history of recent right brain  cerebrovascular accident, migraine headaches who was readmitted on Jan 05, 2005 with bradycardia and left body weakness.  She was subsequently found to  have a new subacute left CSO infarct.  The patient now presents for  transesophageal echocardiogram to rule out possible cardiac source of  embolus.   DESCRIPTION OF OPERATION:  After getting informed written consent, the  patient was brought to the endoscopy suite in a fasting state.  She  underwent successful uncomplicated transesophageal echocardiogram.   Left ventricle was normal size and normal function estimated at 60%.   Structurally normal aortic valve with mild aortic regurgitation.   Structurally normal mitral valve with mild mitral regurgitation.   Structurally normal tricuspid valve with trivial tricuspid regurgitation.   Structurally normal pulmonic valve with trivial pulmonic regurgitation.   No intracardiac mass or thrombus noted.   No evidence of patent foramen ovale by color and contrast echo.   Mild atherosclerosis of the descending thoracic aorta.   CONCLUSION:  Successful transesophageal echocardiogram with findings as  noted above.  There is no intracardiac source of embolus noted.      RHM/MEDQ  D:  01/06/2005  T:  01/06/2005  Job:  [id number]   cc:   Aida Puffer  [address]  [place]  [place] [address]  Fax: [phone]

## 2010-12-31 NOTE — Discharge Summary (Signed)
NAMEMarland Kitchen  Kellie Velez, Kellie Velez                ACCOUNT NO.:  [id number]   MEDICAL RECORD NO.:  [id number]          PATIENT TYPE:  INP   LOCATION:  3003                         FACILITY:  [facility]   PHYSICIAN:  Genene Churn. Love, M.D.    DATE OF BIRTH:  20-Dec-1955   DATE OF ADMISSION:  01/04/2005  DATE OF DISCHARGE:  01/08/2005                                 DISCHARGE SUMMARY   ADDENDUM:  After talking with the physician's associate for [facility], it  has been arranged for the patient to be seen in follow up by Dr. Caprice Kluver.  The patient will be called at home this week regarding follow up to  reevaluate bradycardia.      JML/MEDQ  D:  01/08/2005  T:  01/08/2005  Job:  [id number]

## 2011-05-09 LAB — BASIC METABOLIC PANEL
BUN: 17
Calcium: 9.5
GFR calc non Af Amer: 50 — ABNORMAL LOW
Glucose, Bld: 101 — ABNORMAL HIGH
Sodium: 138

## 2011-05-20 ENCOUNTER — Encounter (INDEPENDENT_AMBULATORY_CARE_PROVIDER_SITE_OTHER): Payer: Self-pay | Admitting: General Surgery

## 2011-05-20 ENCOUNTER — Ambulatory Visit (INDEPENDENT_AMBULATORY_CARE_PROVIDER_SITE_OTHER): Payer: Medicare Other | Admitting: General Surgery

## 2011-05-20 VITALS — BP 128/76 | HR 88 | Temp 97.4°F | Ht 66.0 in | Wt 238.8 lb

## 2011-05-20 DIAGNOSIS — R29898 Other symptoms and signs involving the musculoskeletal system: Secondary | ICD-10-CM

## 2011-05-20 DIAGNOSIS — R2240 Localized swelling, mass and lump, unspecified lower limb: Secondary | ICD-10-CM

## 2011-05-20 MED ORDER — HYDROCODONE-ACETAMINOPHEN 5-500 MG PO TABS: 1.0000 | ORAL_TABLET | Freq: Four times a day (QID) | ORAL | Status: AC | PRN

## 2011-05-20 NOTE — Progress Notes (Signed)
Chief Complaint  Patient presents with  . Other    eval cyst on Rt hip    HPI Kellie Velez is a 55 y.o. female.  This patient is referred by Dr. Clarene Duke for evaluation of a right buttock mass which the patient states has been present for over 7 years. She states this has been fluctuating in size over this time period and is actually recently gotten a little smaller however over last while she states that she has had some pain in the area and has some pain shooting down her leg which she attributes to the mass. She denies any drainage from the area or redness or fevers or chills. She has had prior incision and drainage in the buttock and perirectal region in the past. She denies any prior procedures in this particular spot. HPI  Past Medical History  Diagnosis Date  . Arthritis   . Hypertension   . Nerve damage   . Osteoporosis   . Stroke   . Hearing loss   . Abdominal pain     Past Surgical History  Procedure Date  . Cholecystectomy 1980  . Appendectomy 1983  . Total knee arthroplasty 2003    left  . Pace maker 2006    History reviewed. No pertinent family history.  Social History History  Substance Use Topics  . Smoking status: Current Everyday Smoker -- 0.5 packs/day    Types: Cigarettes  . Smokeless tobacco: Not on file  . Alcohol Use: No    No Known Allergies  Current Outpatient Prescriptions  Medication Sig Dispense Refill  . amLODipine (NORVASC) 5 MG tablet       . aspirin 325 MG tablet Take 325 mg by mouth daily.        . diazepam (VALIUM) 5 MG tablet Take 5 mg by mouth Nightly.        . ergocalciferol (VITAMIN D2) 50000 UNITS capsule Take 50,000 Units by mouth once a week.        . folic acid (FOLVITE) 800 MCG tablet Take 400 mcg by mouth daily.        Marland Kitchen lithium carbonate 300 MG capsule       . methylPREDNISolone (MEDROL) 4 MG tablet       . oxaprozin (DAYPRO) 600 MG tablet       . vitamin B-12 (CYANOCOBALAMIN) 100 MCG tablet Take 50 mcg by mouth daily.         Marland Kitchen HYDROcodone-acetaminophen (NORCO) 5-325 MG per tablet       . HYDROcodone-acetaminophen (VICODIN) 5-500 MG per tablet Take 1 tablet by mouth every 6 (six) hours as needed for pain.  30 tablet  0    Review of Systems Review of Systems  HENT: Positive for hearing loss.   Gastrointestinal: Positive for abdominal pain.  Musculoskeletal: Positive for arthralgias.    Blood pressure 128/76, pulse 88, temperature 97.4 F (36.3 C), height 5\' 6"  (1.676 m), weight 238 lb 12.8 oz (108.319 kg).  Physical Exam Physical Exam  Constitutional: She is oriented to person, place, and time. She appears well-developed and well-nourished. No distress.  HENT:  Head: Normocephalic and atraumatic.  Eyes: Conjunctivae and EOM are normal. Pupils are equal, round, and reactive to light. Right eye exhibits no discharge. Left eye exhibits no discharge. No scleral icterus.  Neck: Normal range of motion. Neck supple. No tracheal deviation present.  Cardiovascular: Normal rate, regular rhythm and normal heart sounds.   Pulmonary/Chest: Effort normal and breath sounds normal. No  stridor. No respiratory distress. She has no wheezes.  Abdominal: Soft. Bowel sounds are normal. She exhibits no distension and no mass. There is no tenderness. There is no rebound and no guarding.  Musculoskeletal: Normal range of motion. She exhibits no edema.  Neurological: She is alert and oriented to person, place, and time. She has normal reflexes.  Skin: Skin is warm and dry. No rash noted. She is not diaphoretic. No erythema. No pallor.       She has a 4 cm x 2 cm ovoid mass in the subcutaneous tissue of her right buttock region this does not appear to be fixed the skin and is mobile within the subcutaneous tissue. This does not appear to be deep into the muscle and did not appear to be causing any neurovascular compromise.  Psychiatric: She has a normal mood and affect. Her behavior is normal. Judgment and thought content normal.      Data Reviewed   Assessment    Right buttock mass. Disappears independent of the skin and has no evidence of infection. I'm not certain as to what this is. I cannot completely rule out malignancy although from her history this does not sound like a malignancy. I doubt that this is causing her symptoms and I did explain this to the patient.    Plan    I recommend MRI of the lesion however she has a pacemaker in place and she states that she cannot obtain MRI evaluation. I think a CT would not be that helpful and this is a small lesion in appears mobile in the subcutaneous tissue and the superficial the skin. We'll plan for excisional biopsy. I did explain that her biggest risk is that this does not improve her symptoms but this does not appear to be deep enough to be causing nerve symptoms. She also at understands that if this is malignancy that she will require repeat excision for definitive treatment. I also recommended that she visit with her cardiologist for preoperative clearance and she is very scheduled to visit with him next week. We will get anesthesia consult and plan for surgical excision as soon as available.       Lodema Pilot DAVID 05/20/2011, 10:35 AM

## 2011-05-23 ENCOUNTER — Encounter (INDEPENDENT_AMBULATORY_CARE_PROVIDER_SITE_OTHER): Payer: Self-pay | Admitting: General Surgery

## 2011-05-23 ENCOUNTER — Telehealth (INDEPENDENT_AMBULATORY_CARE_PROVIDER_SITE_OTHER): Payer: Self-pay

## 2011-05-23 NOTE — Telephone Encounter (Signed)
Patient called to correct medication list given on office visit.  Also to make aware she is allergic to  Epinephrine. RMP10/03/2011

## 2011-05-24 ENCOUNTER — Encounter (HOSPITAL_BASED_OUTPATIENT_CLINIC_OR_DEPARTMENT_OTHER)
Admission: RE | Admit: 2011-05-24 | Discharge: 2011-05-24 | Disposition: A | Discharge status: Home / Self Care | Payer: Medicare Other | Source: Ambulatory Visit | Attending: General Surgery | Admitting: General Surgery

## 2011-05-24 LAB — BASIC METABOLIC PANEL
CO2: 25 mEq/L (ref 19–32)
Calcium: 10.3 mg/dL (ref 8.4–10.5)
Chloride: 105 mEq/L (ref 96–112)
Creatinine, Ser: 0.93 mg/dL (ref 0.50–1.10)
Glucose, Bld: 96 mg/dL (ref 70–99)

## 2011-05-25 ENCOUNTER — Ambulatory Visit (HOSPITAL_BASED_OUTPATIENT_CLINIC_OR_DEPARTMENT_OTHER)
Admission: RE | Admit: 2011-05-25 | Discharge: 2011-05-25 | Disposition: A | Discharge status: Home / Self Care | Payer: Medicare Other | Source: Ambulatory Visit | Attending: General Surgery | Admitting: General Surgery

## 2011-05-25 ENCOUNTER — Other Ambulatory Visit (INDEPENDENT_AMBULATORY_CARE_PROVIDER_SITE_OTHER): Payer: Self-pay | Admitting: General Surgery

## 2011-05-25 DIAGNOSIS — L723 Sebaceous cyst: Secondary | ICD-10-CM | POA: Insufficient documentation

## 2011-05-25 DIAGNOSIS — R5381 Other malaise: Secondary | ICD-10-CM | POA: Insufficient documentation

## 2011-05-25 DIAGNOSIS — R5383 Other fatigue: Secondary | ICD-10-CM | POA: Insufficient documentation

## 2011-05-25 DIAGNOSIS — I69998 Other sequelae following unspecified cerebrovascular disease: Secondary | ICD-10-CM | POA: Insufficient documentation

## 2011-05-25 DIAGNOSIS — F17200 Nicotine dependence, unspecified, uncomplicated: Secondary | ICD-10-CM | POA: Insufficient documentation

## 2011-05-25 DIAGNOSIS — Z01812 Encounter for preprocedural laboratory examination: Secondary | ICD-10-CM | POA: Insufficient documentation

## 2011-05-25 LAB — POCT HEMOGLOBIN-HEMACUE: Hemoglobin: 12.1 g/dL (ref 12.0–15.0)

## 2011-05-27 NOTE — Op Note (Signed)
NAME:  Kellie Velez, Kellie Velez                ACCOUNT NO.:  [id number]  MEDICAL RECORD NO.:  [id number]  LOCATION:                                 FACILITY:  PHYSICIAN:  Lodema Pilot, MD       DATE OF BIRTH:  07-24-1956  DATE OF PROCEDURE: DATE OF DISCHARGE:                              OPERATIVE REPORT   PROCEDURE:  Excision of right buttock mass.  PREOPERATIVE DIAGNOSIS:  Right buttock mass.  POSTOPERATIVE DIAGNOSIS:  Right buttock mass.  SURGEON:  Lodema Pilot, MD.  ASSISTANT:  None.  ANESTHESIA:  10 mL of 1% lidocaine with monitored anesthesia care.  FLUIDS:  700 mL of crystalloid.  ESTIMATED BLOOD LOSS:  Minimal.  DRAINS:  None.  SPECIMENS:  3 cm x 2 cm right buttock mass sent to pathology for permanent sectioning.  COMPLICATIONS:  None apparent.  FINDINGS:  3 cm x 2 cm lobulated buttock mass, lesion was bisected on the back table and contained a cheesy material likely a skin cyst.  It was in the fatty tissues and there was no evidence of neurovascular involvement.  INDICATION OF PROCEDURE:  [name] is a 55 year old female, who complains of pain in the area of her right buttock and she is found to have a palpable mass in the area, which she says is fluctuated in size. After the risks of the procedure were discussed with her in lay terms including the risk of persistent symptoms, after procedure and recurrence, and injury to nerves, and infection, and she expressed understanding and desired to proceed with excision.  OPERATIVE DETAILS:  [name] was seen and evaluated in the preoperative area and risks and benefits of procedure were again discussed in lay terms.  Informed consent was obtained and prophylactic antibiotics were given.  The site was marked prior to anesthetic administration with the patient, and she was then taken to the operating room, placed on table in a lateral position, and her right buttock area was prepped and draped in a standard surgical  fashion.  An elliptical incision was made over the lesion and dissection carried down to the subcutaneous tissue using Bovie electrocautery.  The lesion was identified and dissected circumferentially with the Bovie and the lesion was undermined and measuring approximately 3 cm x 2 cm and was bisected on the back table and contained a cheesy material likely a skin cyst, although, there was no evidence of skin involvement and appeared to move independently on the skin on exam.  The specimen was passed off the table and sent to pathology.  The wound was injected with a total of 10 mL of 1% lidocaine and hemostasis obtained with Bovie electrocautery and the dermis was approximated with interrupted 2-0 Vicryl sutures, and the skin edges were approximated with a 4-0 Monocryl subcuticular suture. The skin was washed and dried and Dermabond was applied.  All sponge, needle, and instrument counts correct at end of the case, and the patient tolerated the procedure well without apparent complication.          ______________________________ Lodema Pilot, MD     BL/MEDQ  D:  05/25/2011  T:  05/25/2011  Job:  [id number]  Electronically Signed by Lodema Pilot DO on 05/27/2011 12:16:57 AM

## 2011-05-30 ENCOUNTER — Telehealth (INDEPENDENT_AMBULATORY_CARE_PROVIDER_SITE_OTHER): Payer: Self-pay | Admitting: General Surgery

## 2011-05-31 ENCOUNTER — Telehealth (INDEPENDENT_AMBULATORY_CARE_PROVIDER_SITE_OTHER): Payer: Self-pay

## 2011-05-31 NOTE — Telephone Encounter (Signed)
Returned patient call with her pathology results.

## 2011-06-03 ENCOUNTER — Other Ambulatory Visit: Payer: Self-pay | Admitting: Cardiovascular Disease

## 2011-06-03 ENCOUNTER — Ambulatory Visit
Admission: RE | Admit: 2011-06-03 | Discharge: 2011-06-03 | Disposition: A | Discharge status: Home / Self Care | Payer: Medicare Other | Source: Ambulatory Visit | Attending: Cardiovascular Disease | Admitting: Cardiovascular Disease

## 2011-06-03 DIAGNOSIS — Z01811 Encounter for preprocedural respiratory examination: Secondary | ICD-10-CM

## 2011-06-08 ENCOUNTER — Ambulatory Visit (HOSPITAL_COMMUNITY)
Admission: RE | Admit: 2011-06-08 | Discharge: 2011-06-08 | Disposition: A | Discharge status: Home / Self Care | Payer: Medicare Other | Source: Ambulatory Visit | Attending: Cardiovascular Disease | Admitting: Cardiovascular Disease

## 2011-06-08 DIAGNOSIS — Z950 Presence of cardiac pacemaker: Secondary | ICD-10-CM

## 2011-06-08 DIAGNOSIS — Z45018 Encounter for adjustment and management of other part of cardiac pacemaker: Secondary | ICD-10-CM | POA: Insufficient documentation

## 2011-06-08 HISTORY — PX: PERMANENT PACEMAKER GENERATOR CHANGE: SHX6022

## 2011-06-08 HISTORY — DX: Presence of cardiac pacemaker: Z95.0

## 2011-06-08 LAB — CBC
HCT: 38.7 % (ref 36.0–46.0)
Hemoglobin: 12.7 g/dL (ref 12.0–15.0)
MCHC: 32.8 g/dL (ref 30.0–36.0)
MCV: 87 fL (ref 78.0–100.0)
RDW: 15.4 % (ref 11.5–15.5)

## 2011-06-08 LAB — SURGICAL PCR SCREEN: Staphylococcus aureus: NEGATIVE

## 2011-06-09 ENCOUNTER — Encounter (INDEPENDENT_AMBULATORY_CARE_PROVIDER_SITE_OTHER): Payer: Medicare Other | Admitting: General Surgery

## 2011-06-16 NOTE — Op Note (Signed)
  NAME:  Kellie Velez, Kellie Velez                ACCOUNT NO.:  [id number]  MEDICAL RECORD NO.:  [id number]  LOCATION:  [facility]                         FACILITY:  [facility]  PHYSICIAN:  Thurmon Fair, MD     DATE OF BIRTH:  [birth year]  DATE OF PROCEDURE:  06/08/2011 DATE OF DISCHARGE:                              OPERATIVE REPORT   PROCEDURE PERFORMED:  Replacement of dual-chamber permanent pacemaker generator.  REASON FOR PROCEDURE:  Pacemaker generator at elective replacement interval; sinus node dysfunction.  PROCEDURE PERFORMED:  Thurmon Fair, MD.  ESTIMATED BLOOD LOSS:  Less than 5 mL.  COMPLICATIONS:  None.  MEDICATIONS ADMINISTERED: 1. Ancef 1 g intravenously. 2. Lidocaine 1% 30 mL locally. 3. Versed 4 mg intravenously. 4. Fentanyl 75 mcg intravenously.  DEVICE DETAILS:  The explanted generator is a Medtronic Remington, model [id number], serial [id number], implanted in February 01, 2005.  The existing leads were left in place and they are right atrial Medtronic 5594 - 53 cm, serial #[id number] implanted in 2006.  The right ventricular lead is a Medtronic 4092 - 58 cm, serial #[id number], implanted in 2006.  The new pacemaker generator is a Medtronic Adapta L, model #ADDRL1, serial [id number].  After the risks and benefits of the procedure described, the patient provided informed consent, was brought to the cardiac cath lab in a fasting state.  She was prepped and draped in usual sterile fashion. Local anesthesia 1% lidocaine was administered at the pacemaker site.  A 6-cm horizontal incision was made parallel to the lower border of the left clavicle at the level of the previous scar.  Limited electrocautery and combination of blunt and sharp dissection were used to open the pocket and expose the generator.  Great care was taken not to injure the chronic leads and also to ensure excellent hemostasis.  The pocket was carefully inspected and found to have good hemostasis. The  chronic leads were detached from the old generator and attached to a new generator with appropriate pacemaker function noted.  The leads were tested via the programmer and found to have excellent electronic parameters (see below).  The pocket was flushed with copious amounts of antibiotic solution and reinspected for hemostasis.  The generator was placed in the pocket with care being taken that the leads be located deep to the generator.  The pocket was then closed in layers using 2 layers of 2-0 Vicryl and one layer of cutaneous staples after which a sterile dressing was applied.  No complications occurred.  ELECTRONIC PARAMETERS:  Atrial lead sensed P-waves 6.8 mV, impedance 446 ohms, threshold 0.3 V at 0.5 msec pulse width.  Right ventricular lead sensed R-waves 16.8 mV, impedance 550 ohms, threshold 0.6 V at 0.5 msec pulse width.     Thurmon Fair, MD     MC/MEDQ  D:  06/08/2011  T:  06/08/2011  Job:  [id number]  Electronically Signed by Thurmon Fair M.D. on 06/16/2011 11:07:17 AM

## 2011-10-27 ENCOUNTER — Telehealth (INDEPENDENT_AMBULATORY_CARE_PROVIDER_SITE_OTHER): Payer: Self-pay | Admitting: General Surgery

## 2011-11-01 ENCOUNTER — Telehealth (INDEPENDENT_AMBULATORY_CARE_PROVIDER_SITE_OTHER): Payer: Self-pay

## 2011-11-01 NOTE — Telephone Encounter (Signed)
Pathology results given to patient from cyst removal on 05/25/11, patient wasn't able to come to post op due to illness after surgery.

## 2012-07-20 ENCOUNTER — Encounter (HOSPITAL_COMMUNITY): Payer: Self-pay | Admitting: *Deleted

## 2012-07-20 ENCOUNTER — Emergency Department (HOSPITAL_COMMUNITY): Payer: Medicare Other

## 2012-07-20 ENCOUNTER — Emergency Department (HOSPITAL_COMMUNITY)
Admission: EM | Admit: 2012-07-20 | Discharge: 2012-07-20 | Disposition: A | Discharge status: Home / Self Care | Payer: Medicare Other | Attending: Emergency Medicine | Admitting: Emergency Medicine

## 2012-07-20 DIAGNOSIS — Z7982 Long term (current) use of aspirin: Secondary | ICD-10-CM | POA: Insufficient documentation

## 2012-07-20 DIAGNOSIS — F17200 Nicotine dependence, unspecified, uncomplicated: Secondary | ICD-10-CM | POA: Insufficient documentation

## 2012-07-20 DIAGNOSIS — I10 Essential (primary) hypertension: Secondary | ICD-10-CM | POA: Insufficient documentation

## 2012-07-20 DIAGNOSIS — Z950 Presence of cardiac pacemaker: Secondary | ICD-10-CM | POA: Insufficient documentation

## 2012-07-20 DIAGNOSIS — H8110 Benign paroxysmal vertigo, unspecified ear: Secondary | ICD-10-CM

## 2012-07-20 DIAGNOSIS — Z8673 Personal history of transient ischemic attack (TIA), and cerebral infarction without residual deficits: Secondary | ICD-10-CM | POA: Insufficient documentation

## 2012-07-20 DIAGNOSIS — Z8739 Personal history of other diseases of the musculoskeletal system and connective tissue: Secondary | ICD-10-CM | POA: Insufficient documentation

## 2012-07-20 DIAGNOSIS — Z79899 Other long term (current) drug therapy: Secondary | ICD-10-CM | POA: Insufficient documentation

## 2012-07-20 LAB — CBC WITH DIFFERENTIAL/PLATELET
HCT: 38.4 % (ref 36.0–46.0)
Hemoglobin: 12.6 g/dL (ref 12.0–15.0)
Lymphs Abs: 1.9 10*3/uL (ref 0.7–4.0)
Monocytes Absolute: 0.4 10*3/uL (ref 0.1–1.0)
Monocytes Relative: 6 % (ref 3–12)
Neutro Abs: 4.7 10*3/uL (ref 1.7–7.7)
Neutrophils Relative %: 62 % (ref 43–77)
RBC: 4.47 MIL/uL (ref 3.87–5.11)

## 2012-07-20 LAB — LITHIUM LEVEL: Lithium Lvl: 0.75 mEq/L — ABNORMAL LOW (ref 0.80–1.40)

## 2012-07-20 LAB — BASIC METABOLIC PANEL
BUN: 16 mg/dL (ref 6–23)
CO2: 24 mEq/L (ref 19–32)
Chloride: 107 mEq/L (ref 96–112)
Creatinine, Ser: 1.11 mg/dL — ABNORMAL HIGH (ref 0.50–1.10)
Glucose, Bld: 99 mg/dL (ref 70–99)
Potassium: 4.1 mEq/L (ref 3.5–5.1)

## 2012-07-20 MED ORDER — MECLIZINE HCL 25 MG PO TABS
25.0000 mg | ORAL_TABLET | Freq: Once | ORAL | Status: AC
  Administered 2012-07-20: 25 mg via ORAL
  Filled 2012-07-20: qty 1

## 2012-07-20 MED ORDER — MECLIZINE HCL 50 MG PO TABS: 25.0000 mg | ORAL_TABLET | Freq: Three times a day (TID) | ORAL | Status: DC | PRN

## 2012-07-20 NOTE — ED Provider Notes (Addendum)
History     CSN: [id number]  Arrival date & time 07/20/12  0202   First MD Initiated Contact with Patient 07/20/12 0230      Chief Complaint  Patient presents with  . Dizziness    (Consider location/radiation/quality/duration/timing/severity/associated sxs/prior treatment) HPI 56 yo female presents to the ER via EMS with complaint of dizziness.  Pt reports she woke around 1230 to go to the bathroom and when she rolled over she had acute onset of dizziness described as vertigo.  Nausea, no vomiting.  Dizziness resolves when she stays still.  Pt has felt off balance due to vertigo/spinning feeling.  Pt has h/o TIA/stroke in 2006.  Pt has had recent URI sxs with sinus congestion, ear pressure.  She denies any HA, no focal weakness, facial droop, difficulties speaking/swallowing.  Pt reports with her last stroke she had severe headache and right sided weakness.  Past Medical History  Diagnosis Date  . Arthritis   . Hypertension   . Nerve damage   . Osteoporosis   . Hearing loss   . Abdominal pain   . Stroke     TIA's    Past Surgical History  Procedure Date  . Cholecystectomy 1980  . Appendectomy 1983  . Total knee arthroplasty 2003    left  . Pace maker 2006    History reviewed. No pertinent family history.  History  Substance Use Topics  . Smoking status: Current Every Day Smoker -- 0.5 packs/day    Types: Cigarettes  . Smokeless tobacco: Not on file  . Alcohol Use: No    OB History    Grav Para Term Preterm Abortions TAB SAB Ect Mult Living                  Review of Systems  All other systems reviewed and are negative.    Allergies  Epinephrine  Home Medications   Current Outpatient Rx  Name  Route  Sig  Dispense  Refill  . AMLODIPINE BESYLATE 5 MG PO TABS   Oral   Take 5 mg by mouth daily.          . ASPIRIN 81 MG PO CHEW   Oral   Chew 81 mg by mouth daily.         Marland Kitchen DIAZEPAM 5 MG PO TABS   Oral   Take 5 mg by mouth at bedtime as  needed. For anxiety         . ERGOCALCIFEROL 50000 UNITS PO CAPS   Oral   Take 50,000 Units by mouth once a week. On fridays         . FOLIC ACID 800 MCG PO TABS   Oral   Take 800 mcg by mouth daily.          Marland Kitchen HYDROCODONE-ACETAMINOPHEN 5-325 MG PO TABS   Oral   Take 1 tablet by mouth every 6 (six) hours as needed. For pain         . LITHIUM CARBONATE 300 MG PO CAPS   Oral   Take 300 mg by mouth 2 (two) times daily with a meal.          . NAPROXEN 500 MG PO TABS   Oral   Take 500 mg by mouth 2 (two) times daily with a meal.         . VITAMIN B-12 500 MCG PO TABS   Oral   Take 500 mcg by mouth daily.         Marland Kitchen  MECLIZINE HCL 50 MG PO TABS   Oral   Take 0.5 tablets (25 mg total) by mouth 3 (three) times daily as needed for dizziness.   30 tablet   0     BP 143/76  Pulse 81  Temp 98.4 F (36.9 C) (Oral)  Resp 16  SpO2 99%  Physical Exam  Nursing note and vitals reviewed. Constitutional: She is oriented to person, place, and time. She appears well-developed and well-nourished.  HENT:  Head: Normocephalic and atraumatic.  Right Ear: External ear normal.  Left Ear: External ear normal.  Nose: Nose normal.  Mouth/Throat: Oropharynx is clear and moist.  Eyes: Conjunctivae normal and EOM are normal. Pupils are equal, round, and reactive to light.  Neck: Normal range of motion. Neck supple. No JVD present. No tracheal deviation present. No thyromegaly present.  Cardiovascular: Normal rate, regular rhythm, normal heart sounds and intact distal pulses.  Exam reveals no gallop and no friction rub.   No murmur heard. Pulmonary/Chest: Effort normal and breath sounds normal. No stridor. No respiratory distress. She has no wheezes. She has no rales. She exhibits no tenderness.  Abdominal: Soft. Bowel sounds are normal. She exhibits no distension and no mass. There is no tenderness. There is no rebound and no guarding.  Musculoskeletal: Normal range of motion. She  exhibits no edema and no tenderness.  Lymphadenopathy:    She has no cervical adenopathy.  Neurological: She is alert and oriented to person, place, and time. She has normal reflexes. No cranial nerve deficit. She exhibits normal muscle tone. Coordination normal.       No nystagmus with rotation of the head, but pt reports worsening vertigo with rotation to left  Skin: Skin is dry. No rash noted. No erythema. No pallor.  Psychiatric: She has a normal mood and affect. Her behavior is normal. Judgment and thought content normal.    ED Course  Procedures (including critical care time)  Labs Reviewed  LITHIUM LEVEL - Abnormal; Notable for the following:    Lithium Lvl 0.75 (*)     All other components within normal limits  CBC WITH DIFFERENTIAL - Abnormal; Notable for the following:    Eosinophils Relative 6 (*)     All other components within normal limits  BASIC METABOLIC PANEL - Abnormal; Notable for the following:    Creatinine, Ser 1.11 (*)     GFR calc non Af Amer 54 (*)     GFR calc Af Amer 62 (*)     All other components within normal limits  PRO B NATRIURETIC PEPTIDE  TROPONIN I   Dg Chest 2 View  07/20/2012  *RADIOLOGY REPORT*  Clinical Data: Cough, shortness of breath and dizziness.  CHEST - 2 VIEW  Comparison: Chest radiograph performed 06/03/2011  Findings: The lungs are well-aerated and clear.  There is no evidence of focal opacification, pleural effusion or pneumothorax.  The cardiomediastinal silhouette is normal in size; a pacemaker is noted at the left chest wall, with leads ending at the right atrium and right ventricle.  No acute osseous abnormalities are seen.  IMPRESSION: No acute cardiopulmonary process seen.   Original Report Authenticated By: Tonia Ghent, M.D.    Ct Head Wo Contrast  07/20/2012  *RADIOLOGY REPORT*  Clinical Data: Dizziness and frontal headache.  Vertigo.  CT HEAD WITHOUT CONTRAST  Technique:  Contiguous axial images were obtained from the base of  the skull through the vertex without contrast.  Comparison: CT of the head performed 11/07/2007  Findings: There is no evidence of acute infarction, mass lesion, or intra- or extra-axial hemorrhage on CT.  Mild chronic infarcts are noted at the left frontoparietal region and left cerebellar hemisphere.  The brainstem and fourth ventricle are within normal limits.  The third and lateral ventricles, and basal ganglia are unremarkable in appearance.  No mass effect or midline shift is seen.  There is no evidence of fracture; visualized osseous structures are unremarkable in appearance.  Mild bilateral proptosis is noted.  A small mucus retention cyst or polyp is noted within the left maxillary sinus.  The remaining paranasal sinuses and mastoid air cells are well-aerated.  No significant soft tissue abnormalities are seen.  IMPRESSION:  1.  No acute intracranial pathology seen on CT. 2.  Mild chronic infarcts at the left frontoparietal region and left cerebellar hemisphere. 3.  Mild bilateral proptosis noted. 4.  Small mucus retention cyst or polyp within the left maxillary sinus.   Original Report Authenticated By: Tonia Ghent, M.D.     Date: 07/20/2012  Rate: 86  Rhythm: paced rhythm with pvcs  QRS Axis: normal  Intervals: paced  ST/T Wave abnormalities: normal  Conduction Disutrbances:paced  Narrative Interpretation:   Old EKG Reviewed: none available    1. Benign positional vertigo       MDM  56 yo female with vertigo.  Sxs seem c/w peripheral vertigo, but pt with h/o tia/stroke.  Will get labs, ekg, ct scan of head, try meclizine.  Pt with complete resolution of symptoms with meclizine, feeling better, no further dizziness.  Workup unremarkable.  She will f/u with her pcm.     Of note, pt with reported bp by first responders of sbp 200s while in paced rhythm.  No signs of severe htn for EMS or ED, suspect spurious value.   Olivia Mackie, MD 07/20/12 1950  Olivia Mackie,  MD 07/20/12 [phone]

## 2012-07-20 NOTE — ED Notes (Signed)
Pt calling family for ride home. Pt able to find sister in law to get her from E.D.

## 2012-07-20 NOTE — ED Notes (Addendum)
Per EMS: pt woke up this morning at 02:30 with dizziness. Pt states that the dizziness started with movement of her head off the pillow. Pt was very anxious about the dizziness due to previous TIA's in 2006. Pt denies N/V. Pt denies dizziness currently. Pt able to move from EMS stretcher to our bed by standing. Pt alert and oriented. Pt does have pacemaker that when it if working increases pt BP. Pt regular HR BP is 140's systolic and with pacemaker. Pt BP is 200's systolic.

## 2012-07-20 NOTE — ED Notes (Signed)
Pt states feels better after meclizine

## 2012-07-26 LAB — REMOTE PACEMAKER DEVICE

## 2012-07-27 ENCOUNTER — Other Ambulatory Visit (HOSPITAL_COMMUNITY): Payer: Self-pay | Admitting: Cardiovascular Disease

## 2012-07-27 DIAGNOSIS — R0602 Shortness of breath: Secondary | ICD-10-CM

## 2012-07-27 DIAGNOSIS — G459 Transient cerebral ischemic attack, unspecified: Secondary | ICD-10-CM

## 2012-07-27 DIAGNOSIS — R0989 Other specified symptoms and signs involving the circulatory and respiratory systems: Secondary | ICD-10-CM

## 2012-07-27 DIAGNOSIS — I10 Essential (primary) hypertension: Secondary | ICD-10-CM

## 2012-08-13 ENCOUNTER — Ambulatory Visit (HOSPITAL_COMMUNITY)
Admission: RE | Admit: 2012-08-13 | Discharge: 2012-08-13 | Disposition: A | Discharge status: Home / Self Care | Payer: Medicare Other | Source: Ambulatory Visit | Attending: Cardiovascular Disease | Admitting: Cardiovascular Disease

## 2012-08-13 DIAGNOSIS — I10 Essential (primary) hypertension: Secondary | ICD-10-CM | POA: Insufficient documentation

## 2012-08-13 DIAGNOSIS — R0602 Shortness of breath: Secondary | ICD-10-CM | POA: Insufficient documentation

## 2012-08-13 DIAGNOSIS — E669 Obesity, unspecified: Secondary | ICD-10-CM | POA: Insufficient documentation

## 2012-08-13 DIAGNOSIS — R0989 Other specified symptoms and signs involving the circulatory and respiratory systems: Secondary | ICD-10-CM | POA: Insufficient documentation

## 2012-08-13 DIAGNOSIS — G459 Transient cerebral ischemic attack, unspecified: Secondary | ICD-10-CM | POA: Insufficient documentation

## 2012-08-13 DIAGNOSIS — R011 Cardiac murmur, unspecified: Secondary | ICD-10-CM

## 2012-08-13 DIAGNOSIS — I079 Rheumatic tricuspid valve disease, unspecified: Secondary | ICD-10-CM | POA: Insufficient documentation

## 2012-08-13 DIAGNOSIS — I080 Rheumatic disorders of both mitral and aortic valves: Secondary | ICD-10-CM | POA: Insufficient documentation

## 2012-08-13 DIAGNOSIS — F17200 Nicotine dependence, unspecified, uncomplicated: Secondary | ICD-10-CM | POA: Insufficient documentation

## 2012-08-13 HISTORY — DX: Cardiac murmur, unspecified: R01.1

## 2012-08-13 NOTE — Progress Notes (Signed)
[facility]   2D echo completed 08/13/2012.   Cindy Loyd Salvador, RDCS   

## 2012-08-14 NOTE — Progress Notes (Signed)
Carotid duplex completed. Matt-

## 2012-09-28 ENCOUNTER — Encounter: Payer: Self-pay | Admitting: *Deleted

## 2012-09-28 DIAGNOSIS — I779 Disorder of arteries and arterioles, unspecified: Secondary | ICD-10-CM

## 2012-09-28 DIAGNOSIS — R001 Bradycardia, unspecified: Secondary | ICD-10-CM

## 2012-09-28 DIAGNOSIS — G459 Transient cerebral ischemic attack, unspecified: Secondary | ICD-10-CM

## 2012-09-28 DIAGNOSIS — M1990 Unspecified osteoarthritis, unspecified site: Secondary | ICD-10-CM

## 2012-09-28 DIAGNOSIS — H9190 Unspecified hearing loss, unspecified ear: Secondary | ICD-10-CM | POA: Insufficient documentation

## 2012-09-28 DIAGNOSIS — R011 Cardiac murmur, unspecified: Secondary | ICD-10-CM

## 2012-09-28 DIAGNOSIS — F319 Bipolar disorder, unspecified: Secondary | ICD-10-CM

## 2012-09-28 DIAGNOSIS — M810 Age-related osteoporosis without current pathological fracture: Secondary | ICD-10-CM

## 2012-09-28 DIAGNOSIS — I10 Essential (primary) hypertension: Secondary | ICD-10-CM

## 2012-09-28 DIAGNOSIS — I517 Cardiomegaly: Secondary | ICD-10-CM

## 2012-12-03 ENCOUNTER — Encounter: Payer: Self-pay | Admitting: Cardiovascular Disease

## 2012-12-06 LAB — PACEMAKER DEVICE OBSERVATION

## 2012-12-15 ENCOUNTER — Emergency Department (HOSPITAL_COMMUNITY)
Admission: EM | Admit: 2012-12-15 | Discharge: 2012-12-15 | Disposition: A | Discharge status: Home / Self Care | Payer: Medicare Other | Attending: Emergency Medicine | Admitting: Emergency Medicine

## 2012-12-15 ENCOUNTER — Encounter (HOSPITAL_COMMUNITY): Payer: Self-pay | Admitting: *Deleted

## 2012-12-15 DIAGNOSIS — M1990 Unspecified osteoarthritis, unspecified site: Secondary | ICD-10-CM | POA: Insufficient documentation

## 2012-12-15 DIAGNOSIS — Z8679 Personal history of other diseases of the circulatory system: Secondary | ICD-10-CM | POA: Insufficient documentation

## 2012-12-15 DIAGNOSIS — Z7982 Long term (current) use of aspirin: Secondary | ICD-10-CM | POA: Insufficient documentation

## 2012-12-15 DIAGNOSIS — Z8673 Personal history of transient ischemic attack (TIA), and cerebral infarction without residual deficits: Secondary | ICD-10-CM | POA: Insufficient documentation

## 2012-12-15 DIAGNOSIS — Z950 Presence of cardiac pacemaker: Secondary | ICD-10-CM | POA: Insufficient documentation

## 2012-12-15 DIAGNOSIS — L03115 Cellulitis of right lower limb: Secondary | ICD-10-CM

## 2012-12-15 DIAGNOSIS — H9190 Unspecified hearing loss, unspecified ear: Secondary | ICD-10-CM | POA: Insufficient documentation

## 2012-12-15 DIAGNOSIS — I2510 Atherosclerotic heart disease of native coronary artery without angina pectoris: Secondary | ICD-10-CM | POA: Insufficient documentation

## 2012-12-15 DIAGNOSIS — I10 Essential (primary) hypertension: Secondary | ICD-10-CM | POA: Insufficient documentation

## 2012-12-15 DIAGNOSIS — M810 Age-related osteoporosis without current pathological fracture: Secondary | ICD-10-CM | POA: Insufficient documentation

## 2012-12-15 DIAGNOSIS — Z8719 Personal history of other diseases of the digestive system: Secondary | ICD-10-CM | POA: Insufficient documentation

## 2012-12-15 DIAGNOSIS — Z8659 Personal history of other mental and behavioral disorders: Secondary | ICD-10-CM | POA: Insufficient documentation

## 2012-12-15 DIAGNOSIS — L02419 Cutaneous abscess of limb, unspecified: Secondary | ICD-10-CM | POA: Insufficient documentation

## 2012-12-15 DIAGNOSIS — Z8669 Personal history of other diseases of the nervous system and sense organs: Secondary | ICD-10-CM | POA: Insufficient documentation

## 2012-12-15 DIAGNOSIS — Z79899 Other long term (current) drug therapy: Secondary | ICD-10-CM | POA: Insufficient documentation

## 2012-12-15 DIAGNOSIS — F17200 Nicotine dependence, unspecified, uncomplicated: Secondary | ICD-10-CM | POA: Insufficient documentation

## 2012-12-15 DIAGNOSIS — M79609 Pain in unspecified limb: Secondary | ICD-10-CM

## 2012-12-15 DIAGNOSIS — M7989 Other specified soft tissue disorders: Secondary | ICD-10-CM

## 2012-12-15 LAB — CBC WITH DIFFERENTIAL/PLATELET
Basophils Absolute: 0 10*3/uL (ref 0.0–0.1)
Lymphocytes Relative: 21 % (ref 12–46)
Lymphs Abs: 1.8 10*3/uL (ref 0.7–4.0)
Neutro Abs: 5.9 10*3/uL (ref 1.7–7.7)
Platelets: 219 10*3/uL (ref 150–400)
RBC: 4.42 MIL/uL (ref 3.87–5.11)
RDW: 14.2 % (ref 11.5–15.5)
WBC: 8.9 10*3/uL (ref 4.0–10.5)

## 2012-12-15 LAB — COMPREHENSIVE METABOLIC PANEL
ALT: 26 U/L (ref 0–35)
AST: 27 U/L (ref 0–37)
Alkaline Phosphatase: 68 U/L (ref 39–117)
CO2: 27 mEq/L (ref 19–32)
Chloride: 103 mEq/L (ref 96–112)
GFR calc non Af Amer: 50 mL/min — ABNORMAL LOW (ref >90)
Potassium: 4 mEq/L (ref 3.5–5.1)
Sodium: 138 mEq/L (ref 135–145)
Total Bilirubin: 0.2 mg/dL — ABNORMAL LOW (ref 0.3–1.2)

## 2012-12-15 LAB — APTT: aPTT: 33 seconds (ref 24–37)

## 2012-12-15 MED ORDER — ONDANSETRON HCL 4 MG/2ML IJ SOLN
4.0000 mg | Freq: Once | INTRAMUSCULAR | Status: AC
  Administered 2012-12-15: 4 mg via INTRAVENOUS
  Filled 2012-12-15: qty 2

## 2012-12-15 MED ORDER — TRAMADOL HCL 50 MG PO TABS: 50.0000 mg | ORAL_TABLET | Freq: Four times a day (QID) | ORAL | Status: DC | PRN

## 2012-12-15 MED ORDER — CEPHALEXIN 500 MG PO CAPS: 500.0000 mg | ORAL_CAPSULE | Freq: Four times a day (QID) | ORAL | Status: DC

## 2012-12-15 MED ORDER — CLINDAMYCIN HCL 300 MG PO CAPS: 300.0000 mg | ORAL_CAPSULE | Freq: Once | ORAL | Status: DC

## 2012-12-15 MED ORDER — MORPHINE SULFATE 4 MG/ML IJ SOLN
4.0000 mg | Freq: Once | INTRAMUSCULAR | Status: AC
  Administered 2012-12-15: 4 mg via INTRAVENOUS
  Filled 2012-12-15: qty 1

## 2012-12-15 MED ORDER — CEPHALEXIN 250 MG PO CAPS
500.0000 mg | ORAL_CAPSULE | Freq: Once | ORAL | Status: AC
  Administered 2012-12-15: 500 mg via ORAL
  Filled 2012-12-15: qty 2

## 2012-12-15 NOTE — ED Notes (Signed)
Pt reports swelling to bilateral legs for extended amount of time, now right foot/lower leg has become red and warm to touch.

## 2012-12-15 NOTE — ED Provider Notes (Signed)
History     CSN: [id number]  Arrival date & time 12/15/12  1004   First MD Initiated Contact with Patient 12/15/12 1008      Chief Complaint  Patient presents with  . Leg Swelling    (Consider location/radiation/quality/duration/timing/severity/associated sxs/prior treatment) HPI  Kellie Velez is a 57 y.o. female complaining of bilateral leg swelling worsening over the course of the last few weeks with erythema, redness, pain on the right side onset in the last 24 hours. Patient has no prior history of DVT. Patient denies fever, chest pain, shortness of breath, hemoptysis. No prior history of PE. She has not been immobilized recently, in fact she has been more active than normal because she is caring for her husband who has terminal pancreatic Ca. Not taking any anticoagulants except daily ASA.   Past Medical History  Diagnosis Date  . Arthritis   . Hypertension   . Nerve damage   . Osteoporosis   . Hearing loss   . Abdominal pain   . Stroke     TIA's  . Bradycardia, sinus, persistent, severe   . 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 Bipolar 1 disorder   . Heart murmur 08/13/2012    EF 60-65%,severe LVH,Mild AOV regurg, MR,trivial TR  . Mild carotid artery disease 05/05/2005,08/13/2012    left ICA 0-49% diameter reduction 2006; bilateral fibrous plaque mild 2013  . LVH (left ventricular hypertrophy)     Past Surgical History  Procedure Laterality Date  . Cholecystectomy  1980  . Appendectomy  1983  . Total knee arthroplasty  2003    left  . Permanent pacemaker insertion  01/01/2005    Medtronic EnRhythm  . Permanent pacemaker generator change  06/08/2011    Medtronic Adapta  . Tee without cardioversion  01/06/2005    no cardiac source of embolus    No family history on file.  History  Substance Use Topics  . Smoking status: Current Every Day Smoker -- 0.50 packs/day    Types: Cigarettes  .  Smokeless tobacco: Not on file  . Alcohol Use: No    OB History   Grav Para Term Preterm Abortions TAB SAB Ect Mult Living                  Review of Systems  Constitutional: Negative for fever.  Respiratory: Negative for cough and shortness of breath.   Cardiovascular: Positive for leg swelling. Negative for chest pain.  Gastrointestinal: Negative for nausea, vomiting, abdominal pain and diarrhea.  All other systems reviewed and are negative.    Allergies  Floxin; Plavix; and Epinephrine  Home Medications   Current Outpatient Rx  Name  Route  Sig  Dispense  Refill  . amLODipine (NORVASC) 5 MG tablet   Oral   Take 5 mg by mouth daily.          Marland Kitchen aspirin 81 MG chewable tablet   Oral   Chew 81 mg by mouth daily.         . diazepam (VALIUM) 5 MG tablet   Oral   Take 5 mg by mouth at bedtime as needed. For anxiety         . ergocalciferol (VITAMIN D2) 50000 UNITS capsule   Oral   Take 50,000 Units by mouth once a week. On fridays         . folic acid (FOLVITE) 800 MCG tablet   Oral   Take  800 mcg by mouth daily.          Marland Kitchen HYDROcodone-acetaminophen (NORCO) 5-325 MG per tablet   Oral   Take 1 tablet by mouth every 6 (six) hours as needed. For pain         . ipratropium (ATROVENT HFA) 17 MCG/ACT inhaler   Inhalation   Inhale 2 puffs into the lungs every 6 (six) hours. prn         . lithium carbonate 300 MG capsule   Oral   Take 300 mg by mouth 2 (two) times daily with a meal.          . meclizine (ANTIVERT) 50 MG tablet   Oral   Take 0.5 tablets (25 mg total) by mouth 3 (three) times daily as needed for dizziness.   30 tablet   0   . naproxen (NAPROSYN) 500 MG tablet   Oral   Take 500 mg by mouth 2 (two) times daily with a meal.         . vitamin B-12 (CYANOCOBALAMIN) 500 MCG tablet   Oral   Take 500 mcg by mouth daily.           There were no vitals taken for this visit.  Physical Exam  Nursing note and vitals  reviewed. Constitutional: She is oriented to person, place, and time. She appears well-developed and well-nourished. No distress.  Obese   HENT:  Head: Normocephalic.  Eyes: Conjunctivae and EOM are normal.  Cardiovascular: Normal rate, regular rhythm and normal heart sounds.   Pulmonary/Chest: Effort normal and breath sounds normal. No stridor. No respiratory distress. She has no wheezes. She has no rales. She exhibits no tenderness.  Abdominal: Soft. Bowel sounds are normal. She exhibits no distension and no mass. There is no tenderness. There is no rebound and no guarding.  Musculoskeletal: Normal range of motion. She exhibits edema and tenderness.       Legs: Right lower extremity shows an 2+ pitting edema to upper shin, there is erythema, warmth, tenderness to palpation, Homans sign is positive. No superficial collaterals  or palpable cords. Left lower extremity shows some nonpitting edema.   2 x 6 cm ecchymoses to posterior calf  Neurological: She is alert and oriented to person, place, and time.  Psychiatric: She has a normal mood and affect.       ED Course  Procedures (including critical care time)  Labs Reviewed  COMPREHENSIVE METABOLIC PANEL - Abnormal; Notable for the following:    Glucose, Bld 107 (*)    Creatinine, Ser 1.16 (*)    Total Bilirubin 0.2 (*)    GFR calc non Af Amer 50 (*)    GFR calc Af Amer 59 (*)    All other components within normal limits  CBC WITH DIFFERENTIAL  PROTIME-INR  APTT   No results found.   1. Cellulitis of right leg       MDM   [name] is a 57 y.o. female with pitting edema, erythema and warmth to right lower extremity onset within the last 24 hours. No shortness of breath or chest pain, doubt PE.  Plan is lower extremity  duplex, basic blood work and pain medication.   Verbal report that duplex is negative for clot. It is an extensive cellulitis but patient has no systemic complaints and no leukocytosis. And she is  appropriate for outpatient by mouth medications at this time. I outlined the area of cellulitis with a marker and will give  her her first dose of Keflex in the ED. Return cautions were discussed extensively.  Discussed case with attending who agrees with plan and stability to d/c to home.    Filed Vitals:   12/15/12 1014  BP: 142/84  Pulse: 87  Temp: 98.7 F (37.1 C)  Resp: 18  SpO2: 99%     Pt verbalized understanding and agrees with care plan. Outpatient follow-up and return precautions given.    New Prescriptions   CEPHALEXIN (KEFLEX) 500 MG CAPSULE    Take 1 capsule (500 mg total) by mouth 4 (four) times daily.   TRAMADOL (ULTRAM) 50 MG TABLET    Take 1 tablet (50 mg total) by mouth every 6 (six) hours as needed for pain.      Wynetta Emery, PA-C 12/15/12 1232

## 2012-12-15 NOTE — ED Provider Notes (Signed)
Medical screening examination/treatment/procedure(s) were performed by non-physician practitioner and as supervising physician I was immediately available for consultation/collaboration.    Celene Kras, MD 12/15/12 [phone]

## 2012-12-15 NOTE — Progress Notes (Signed)
VASCULAR LAB PRELIMINARY  PRELIMINARY  PRELIMINARY  PRELIMINARY  Right lower extremity venous Doppler completed.    Preliminary report:  There is no DVT or SVT noted in the right lower extremity.  Keylon Labelle, RVT 12/15/2012, 11:45 AM

## 2013-01-04 ENCOUNTER — Encounter (HOSPITAL_COMMUNITY): Payer: Self-pay | Admitting: *Deleted

## 2013-01-04 ENCOUNTER — Emergency Department (HOSPITAL_COMMUNITY): Payer: Medicare Other

## 2013-01-04 ENCOUNTER — Emergency Department (HOSPITAL_COMMUNITY)
Admission: EM | Admit: 2013-01-04 | Discharge: 2013-01-04 | Disposition: A | Discharge status: Home / Self Care | Payer: Medicare Other | Attending: Emergency Medicine | Admitting: Emergency Medicine

## 2013-01-04 DIAGNOSIS — Z8739 Personal history of other diseases of the musculoskeletal system and connective tissue: Secondary | ICD-10-CM | POA: Insufficient documentation

## 2013-01-04 DIAGNOSIS — Z8719 Personal history of other diseases of the digestive system: Secondary | ICD-10-CM | POA: Insufficient documentation

## 2013-01-04 DIAGNOSIS — F319 Bipolar disorder, unspecified: Secondary | ICD-10-CM | POA: Insufficient documentation

## 2013-01-04 DIAGNOSIS — Z950 Presence of cardiac pacemaker: Secondary | ICD-10-CM | POA: Insufficient documentation

## 2013-01-04 DIAGNOSIS — Z7982 Long term (current) use of aspirin: Secondary | ICD-10-CM | POA: Insufficient documentation

## 2013-01-04 DIAGNOSIS — Z8669 Personal history of other diseases of the nervous system and sense organs: Secondary | ICD-10-CM | POA: Insufficient documentation

## 2013-01-04 DIAGNOSIS — L309 Dermatitis, unspecified: Secondary | ICD-10-CM

## 2013-01-04 DIAGNOSIS — Z79899 Other long term (current) drug therapy: Secondary | ICD-10-CM | POA: Insufficient documentation

## 2013-01-04 DIAGNOSIS — H9190 Unspecified hearing loss, unspecified ear: Secondary | ICD-10-CM | POA: Insufficient documentation

## 2013-01-04 DIAGNOSIS — Z791 Long term (current) use of non-steroidal anti-inflammatories (NSAID): Secondary | ICD-10-CM | POA: Insufficient documentation

## 2013-01-04 DIAGNOSIS — F17200 Nicotine dependence, unspecified, uncomplicated: Secondary | ICD-10-CM | POA: Insufficient documentation

## 2013-01-04 DIAGNOSIS — IMO0001 Reserved for inherently not codable concepts without codable children: Secondary | ICD-10-CM | POA: Insufficient documentation

## 2013-01-04 DIAGNOSIS — L259 Unspecified contact dermatitis, unspecified cause: Secondary | ICD-10-CM | POA: Insufficient documentation

## 2013-01-04 DIAGNOSIS — R011 Cardiac murmur, unspecified: Secondary | ICD-10-CM | POA: Insufficient documentation

## 2013-01-04 DIAGNOSIS — Z8679 Personal history of other diseases of the circulatory system: Secondary | ICD-10-CM | POA: Insufficient documentation

## 2013-01-04 DIAGNOSIS — I10 Essential (primary) hypertension: Secondary | ICD-10-CM | POA: Insufficient documentation

## 2013-01-04 DIAGNOSIS — Z8673 Personal history of transient ischemic attack (TIA), and cerebral infarction without residual deficits: Secondary | ICD-10-CM | POA: Insufficient documentation

## 2013-01-04 DIAGNOSIS — M129 Arthropathy, unspecified: Secondary | ICD-10-CM | POA: Insufficient documentation

## 2013-01-04 LAB — COMPREHENSIVE METABOLIC PANEL
Albumin: 3.2 g/dL — ABNORMAL LOW (ref 3.5–5.2)
BUN: 22 mg/dL (ref 6–23)
CO2: 24 mEq/L (ref 19–32)
Chloride: 102 mEq/L (ref 96–112)
Creatinine, Ser: 1.1 mg/dL (ref 0.50–1.10)
GFR calc non Af Amer: 54 mL/min — ABNORMAL LOW (ref >90)
Total Bilirubin: 0.4 mg/dL (ref 0.3–1.2)

## 2013-01-04 LAB — CBC WITH DIFFERENTIAL/PLATELET
Basophils Relative: 0 % (ref 0–1)
HCT: 32.4 % — ABNORMAL LOW (ref 36.0–46.0)
Hemoglobin: 10.4 g/dL — ABNORMAL LOW (ref 12.0–15.0)
MCHC: 32.1 g/dL (ref 30.0–36.0)
MCV: 85.7 fL (ref 78.0–100.0)
Monocytes Absolute: 0.5 10*3/uL (ref 0.1–1.0)
Monocytes Relative: 6 % (ref 3–12)
Neutro Abs: 5.6 10*3/uL (ref 1.7–7.7)

## 2013-01-04 LAB — PRO B NATRIURETIC PEPTIDE: Pro B Natriuretic peptide (BNP): 268.5 pg/mL — ABNORMAL HIGH (ref 0–125)

## 2013-01-04 MED ORDER — SULFAMETHOXAZOLE-TRIMETHOPRIM 800-160 MG PO TABS: 1.0000 | ORAL_TABLET | Freq: Two times a day (BID) | ORAL | Status: DC

## 2013-01-04 NOTE — ED Notes (Signed)
Pt presents with bilateral leg swelling. Pt has 3+ pitting edema. Pt's bilateral extremities are pink and hot to touch. Pt has good peripheral pulses and perfusion. Pt lungs sounds clear bilaterally. Pt is no obvious distress.

## 2013-01-04 NOTE — ED Notes (Signed)
Bilateral leg red, warmth; pcp prescribed keflex and lasix with some help.  The legs are more red [place]. Also c/o of neck pain - thought pulled muscle.

## 2013-01-04 NOTE — ED Provider Notes (Signed)
History     CSN: [id number]  Arrival date & time 01/04/13  0018   First MD Initiated Contact with Patient 01/04/13 0128      Chief Complaint  Patient presents with  . Muscle Pain  . Leg Swelling    (Consider location/radiation/quality/duration/timing/severity/associated sxs/prior treatment) HPI Comments: Patient presents to the ER for evaluation of swelling and redness of her legs. Patient reports that she was seen in the ER initially with redness and swelling of the right leg recently. She was started on Keflex. She then followed up with her doctor in the office because both legs were swollen. Her Dr. told her that he did not think it was cellulitis, started her on Lasix. Patient reports that the legs are less swollen now because she had to prop up all day, but they're more red than they had been. Patient has not had any fever. There is no chest pain or shortness of breath.  Patient is a 57 y.o. female presenting with musculoskeletal pain.  Muscle Pain Pertinent negatives include no chest pain and no shortness of breath.    Past Medical History  Diagnosis Date  . Arthritis   . Hypertension   . Nerve damage   . Osteoporosis   . Hearing loss   . Abdominal pain   . Stroke     TIA's  . Bradycardia, sinus, persistent, severe   . 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 Bipolar 1 disorder   . Heart murmur 08/13/2012    EF 60-65%,severe LVH,Mild AOV regurg, MR,trivial TR  . Mild carotid artery disease 05/05/2005,08/13/2012    left ICA 0-49% diameter reduction 2006; bilateral fibrous plaque mild 2013  . LVH (left ventricular hypertrophy)     Past Surgical History  Procedure Laterality Date  . Cholecystectomy  1980  . Appendectomy  1983  . Total knee arthroplasty  2003    left  . Permanent pacemaker insertion  01/01/2005    Medtronic EnRhythm  . Permanent pacemaker generator change  06/08/2011    Medtronic Adapta   . Tee without cardioversion  01/06/2005    no cardiac source of embolus    No family history on file.  History  Substance Use Topics  . Smoking status: Current Every Day Smoker -- 0.50 packs/day    Types: Cigarettes  . Smokeless tobacco: Not on file  . Alcohol Use: No    OB History   Grav Para Term Preterm Abortions TAB SAB Ect Mult Living                  Review of Systems  Respiratory: Negative for shortness of breath.   Cardiovascular: Positive for leg swelling. Negative for chest pain.  Skin: Positive for color change.  All other systems reviewed and are negative.    Allergies  Floxin; Plavix; and Epinephrine  Home Medications   Current Outpatient Rx  Name  Route  Sig  Dispense  Refill  . amLODipine (NORVASC) 5 MG tablet   Oral   Take 5 mg by mouth daily.          Marland Kitchen aspirin 81 MG chewable tablet   Oral   Chew 81 mg by mouth daily.         . diazepam (VALIUM) 5 MG tablet   Oral   Take 5 mg by mouth at bedtime as needed. For anxiety         . ergocalciferol (VITAMIN D2) 50000 UNITS  capsule   Oral   Take 50,000 Units by mouth once a week. On fridays         . folic acid (FOLVITE) 800 MCG tablet   Oral   Take 800 mcg by mouth daily.          . furosemide (LASIX) 40 MG tablet   Oral   Take 40 mg by mouth 2 (two) times daily.         Marland Kitchen HYDROcodone-acetaminophen (NORCO) 5-325 MG per tablet   Oral   Take 1 tablet by mouth every 6 (six) hours as needed. For pain         . lithium carbonate 300 MG capsule   Oral   Take 300 mg by mouth 2 (two) times daily with a meal.          . meclizine (ANTIVERT) 50 MG tablet   Oral   Take 0.5 tablets (25 mg total) by mouth 3 (three) times daily as needed for dizziness.   30 tablet   0   . naproxen (NAPROSYN) 500 MG tablet   Oral   Take 500 mg by mouth 2 (two) times daily with a meal.         . traMADol (ULTRAM) 50 MG tablet   Oral   Take 1 tablet (50 mg total) by mouth every 6 (six) hours  as needed for pain.   15 tablet   0   . vitamin B-12 (CYANOCOBALAMIN) 500 MCG tablet   Oral   Take 500 mcg by mouth every other day.          . cephALEXin (KEFLEX) 500 MG capsule   Oral   Take 1 capsule (500 mg total) by mouth 4 (four) times daily.   20 capsule   0     BP 102/62  Pulse 84  Temp(Src) 97.5 F (36.4 C) (Oral)  Resp 16  SpO2 97%  Physical Exam  Constitutional: She is oriented to person, place, and time. She appears well-developed and well-nourished. No distress.  HENT:  Head: Normocephalic and atraumatic.  Right Ear: Hearing normal.  Left Ear: Hearing normal.  Nose: Nose normal.  Mouth/Throat: Oropharynx is clear and moist and mucous membranes are normal.  Eyes: Conjunctivae and EOM are normal. Pupils are equal, round, and reactive to light.  Neck: Normal range of motion. Neck supple.  Cardiovascular: Regular rhythm, S1 normal and S2 normal.  Exam reveals no gallop and no friction rub.   No murmur heard. Pulses:      Dorsalis pedis pulses are 2+ on the right side, and 2+ on the left side.  Pulmonary/Chest: Effort normal and breath sounds normal. No respiratory distress. She exhibits no tenderness.  Abdominal: Soft. Normal appearance and bowel sounds are normal. There is no hepatosplenomegaly. There is no tenderness. There is no rebound, no guarding, no tenderness at McBurney's point and negative Murphy's sign. No hernia.  Musculoskeletal: Normal range of motion. She exhibits edema.  Bilateral lower extremity nonpitting edema  Neurological: She is alert and oriented to person, place, and time. She has normal strength. No cranial nerve deficit or sensory deficit. Coordination normal. GCS eye subscore is 4. GCS verbal subscore is 5. GCS motor subscore is 6.  Skin: Skin is warm, dry and intact. There is erythema. No cyanosis.  Bilateral faint and diffuse erythema with warmth lower legs from mid shin to toes. Symptoms symmetric bilaterally.  Psychiatric: She has  a normal mood and affect. Her speech is normal and  behavior is normal. Thought content normal.    ED Course  Procedures (including critical care time)  Labs Reviewed  CBC WITH DIFFERENTIAL - Abnormal; Notable for the following:    RBC 3.78 (*)    Hemoglobin 10.4 (*)    HCT 32.4 (*)    Eosinophils Relative 6 (*)    All other components within normal limits  COMPREHENSIVE METABOLIC PANEL  PRO B NATRIURETIC PEPTIDE   Dg Chest 2 View  01/04/2013   *RADIOLOGY REPORT*  Clinical Data: Peripheral edema.  CHEST - 2 VIEW  Comparison: 07/20/2012.  Findings: The cardiac silhouette, mediastinal and hilar contours are within normal limits and stable.  The pacer wires are stable. The lungs are clear.  No pleural effusion.  The bony thorax is intact.  IMPRESSION: No acute cardiopulmonary findings.   Original Report Authenticated By: Rudie Meyer, M.D.     Diagnoses: Lower extremity edema with dermatitis    MDM  Patient comes to the ER for evaluation of swelling of her legs. She was previously diagnosed with cellulitis. She was started on Keflex, symptoms did not improve. She reports that both legs became swollen and red and she saw her primary Dr. She was told that the redness was secondary to the swelling he was started on Lasix. Patient does report that after reviewing her legs today the swelling has gone down. She is concerned that she has worsening cellulitis. Symptoms are very symmetrical. It does not look suspicious for cellulitis. There is no well demarcated area of erythema, symptoms are more consistent with a dermatitis from the swelling.  X-ray does not show any evidence of edema or cardiomegaly. BNP was slightly elevated, but not enough to be concerned for acute congestive heart failure at this time. Patient already on Lasix, will continue. As I cannot 100% rule out cellulitis, will add Bactrim to the current Keflex for better skin coverage. Continue to elevate the legs and followup with  primary Dr. in the office. Return to the ER for a fever or worsening symptoms.        Gilda Crease, MD 01/04/13 [phone]

## 2013-01-04 NOTE — ED Notes (Signed)
Pt transported to XR.  

## 2013-01-23 ENCOUNTER — Telehealth: Payer: Self-pay | Admitting: Cardiovascular Disease

## 2013-01-23 NOTE — Telephone Encounter (Signed)
Wants to know if she can still send in transmission for her pacemaker-does not feel like she need to come in for anything else!

## 2013-02-25 ENCOUNTER — Ambulatory Visit: Payer: Medicare Other

## 2013-03-22 ENCOUNTER — Ambulatory Visit: Payer: Medicare Other

## 2013-03-25 ENCOUNTER — Telehealth: Payer: Self-pay | Admitting: Cardiovascular Disease

## 2013-03-25 LAB — PACEMAKER DEVICE OBSERVATION

## 2013-03-25 NOTE — Telephone Encounter (Signed)
Pt is calling about forgetting some information to be sent in regarding her pacer. Can you please call her back.

## 2013-03-25 NOTE — Telephone Encounter (Signed)
Pt just wanted to let us know that she did her pacer transmission this am.

## 2013-04-02 ENCOUNTER — Encounter: Payer: Self-pay | Admitting: *Deleted

## 2013-04-08 ENCOUNTER — Ambulatory Visit: Payer: Medicare Other

## 2013-04-10 ENCOUNTER — Ambulatory Visit: Payer: Medicare Other | Admitting: Physical Therapy

## 2013-06-17 ENCOUNTER — Telehealth: Payer: Self-pay | Admitting: Internal Medicine

## 2013-06-17 NOTE — Telephone Encounter (Signed)
Pt has remote 06-26-13/mt

## 2013-06-26 ENCOUNTER — Ambulatory Visit (INDEPENDENT_AMBULATORY_CARE_PROVIDER_SITE_OTHER): Payer: Medicare Other

## 2013-06-26 DIAGNOSIS — I498 Other specified cardiac arrhythmias: Secondary | ICD-10-CM

## 2013-06-26 DIAGNOSIS — R001 Bradycardia, unspecified: Secondary | ICD-10-CM

## 2013-06-26 LAB — MDC_IDC_ENUM_SESS_TYPE_INCLINIC
Battery Remaining Longevity: 12.5
Battery Voltage: 2.8 V
Brady Statistic AP VP Percent: 1 %
Brady Statistic AS VP Percent: 0.1 % — CL
Lead Channel Impedance Value: 500 Ohm
Lead Channel Setting Pacing Amplitude: 1.5 V
Lead Channel Setting Pacing Amplitude: 2 V
Lead Channel Setting Pacing Pulse Width: 0.4 ms
Lead Channel Setting Sensing Sensitivity: 5.6 mV

## 2013-06-26 LAB — PACEMAKER DEVICE OBSERVATION

## 2013-07-05 ENCOUNTER — Encounter: Payer: Self-pay | Admitting: *Deleted

## 2013-08-15 ENCOUNTER — Telehealth: Payer: Self-pay | Admitting: Internal Medicine

## 2013-08-15 NOTE — Telephone Encounter (Signed)
Pt called with persistent abd pain Wants to be seen by Dr. Wandalee FerdinandHung Saw PCP recently and given bentyl and cipro.  She was unsure of dx She reports hx of diverticulosis, but not diverticulitis No fever or chills No vomiting No blood in stool or melena I advised she call Dr. Haywood PaoHung's office in the am for an appt, and to the ER tonight if worsening She voiced understanding

## 2013-09-30 ENCOUNTER — Ambulatory Visit (INDEPENDENT_AMBULATORY_CARE_PROVIDER_SITE_OTHER): Payer: Medicare Other | Admitting: *Deleted

## 2013-09-30 DIAGNOSIS — R001 Bradycardia, unspecified: Secondary | ICD-10-CM

## 2013-09-30 DIAGNOSIS — I498 Other specified cardiac arrhythmias: Secondary | ICD-10-CM

## 2013-10-04 ENCOUNTER — Other Ambulatory Visit: Payer: Self-pay | Admitting: Cardiovascular Disease

## 2013-10-04 LAB — MDC_IDC_ENUM_SESS_TYPE_REMOTE
Battery Impedance: 113 Ohm
Battery Remaining Longevity: 151 mo
Battery Voltage: 2.8 V
Brady Statistic AP VS Percent: 98 %
Lead Channel Pacing Threshold Amplitude: 0.5 V
Lead Channel Pacing Threshold Pulse Width: 0.4 ms
Lead Channel Pacing Threshold Pulse Width: 0.4 ms
Lead Channel Setting Pacing Amplitude: 1.5 V
Lead Channel Setting Pacing Amplitude: 2 V
Lead Channel Setting Pacing Pulse Width: 0.4 ms
MDC IDC MSMT LEADCHNL RA IMPEDANCE VALUE: 486 Ohm
MDC IDC MSMT LEADCHNL RV IMPEDANCE VALUE: 624 Ohm
MDC IDC MSMT LEADCHNL RV PACING THRESHOLD AMPLITUDE: 0.75 V
MDC IDC MSMT LEADCHNL RV SENSING INTR AMPL: 16 mV
MDC IDC SESS DTM: 20150220004414
MDC IDC SET LEADCHNL RV SENSING SENSITIVITY: 5.6 mV
MDC IDC STAT BRADY AP VP PERCENT: 1 %
MDC IDC STAT BRADY AS VP PERCENT: 0 %
MDC IDC STAT BRADY AS VS PERCENT: 1 %

## 2013-10-14 ENCOUNTER — Encounter: Payer: Self-pay | Admitting: *Deleted

## 2013-10-17 ENCOUNTER — Encounter: Payer: Self-pay | Admitting: Cardiovascular Disease

## 2013-10-19 ENCOUNTER — Telehealth: Payer: Self-pay | Admitting: Internal Medicine

## 2013-10-19 NOTE — Telephone Encounter (Signed)
Patient of Dr. Elnoria Howardhung. Had severe constipation associated with abdominal pain. Moves bowels with improvement in pain. Had colonoscopy within the past year showing diverticulosis and hemorrhoids. At some minor bleeding. Concerned about alternating bowel habits. No fever. Told to take Metamucil supplement. If problems with pain were to recur worsen, she should go to the ER. Otherwise touch base with Dr. hung first of the week.

## 2013-12-30 ENCOUNTER — Encounter: Payer: Medicare Other | Admitting: Cardiovascular Disease

## 2013-12-31 ENCOUNTER — Ambulatory Visit (INDEPENDENT_AMBULATORY_CARE_PROVIDER_SITE_OTHER): Payer: Medicare Other | Admitting: Cardiovascular Disease

## 2013-12-31 ENCOUNTER — Encounter: Payer: Self-pay | Admitting: Cardiovascular Disease

## 2013-12-31 VITALS — BP 144/78 | HR 87 | Resp 16 | Ht 66.0 in | Wt 250.0 lb

## 2013-12-31 DIAGNOSIS — I498 Other specified cardiac arrhythmias: Secondary | ICD-10-CM

## 2013-12-31 DIAGNOSIS — I10 Essential (primary) hypertension: Secondary | ICD-10-CM

## 2013-12-31 DIAGNOSIS — I517 Cardiomegaly: Secondary | ICD-10-CM

## 2013-12-31 DIAGNOSIS — R001 Bradycardia, unspecified: Secondary | ICD-10-CM

## 2013-12-31 DIAGNOSIS — G459 Transient cerebral ischemic attack, unspecified: Secondary | ICD-10-CM

## 2013-12-31 DIAGNOSIS — Z950 Presence of cardiac pacemaker: Secondary | ICD-10-CM

## 2013-12-31 LAB — MDC_IDC_ENUM_SESS_TYPE_INCLINIC
Brady Statistic AP VS Percent: 98 %
Brady Statistic AS VP Percent: 0 %
Brady Statistic AS VS Percent: 1 %
Date Time Interrogation Session: 20150519105718
Lead Channel Impedance Value: 588 Ohm
Lead Channel Pacing Threshold Amplitude: 0.5 V
Lead Channel Pacing Threshold Amplitude: 0.5 V
Lead Channel Pacing Threshold Pulse Width: 0.4 ms
Lead Channel Pacing Threshold Pulse Width: 0.4 ms
Lead Channel Sensing Intrinsic Amplitude: 15.67 mV
Lead Channel Setting Pacing Amplitude: 2 V
Lead Channel Setting Sensing Sensitivity: 5.6 mV
MDC IDC MSMT BATTERY IMPEDANCE: 137 Ohm
MDC IDC MSMT BATTERY REMAINING LONGEVITY: 144 mo
MDC IDC MSMT BATTERY VOLTAGE: 2.8 V
MDC IDC MSMT LEADCHNL RA IMPEDANCE VALUE: 500 Ohm
MDC IDC MSMT LEADCHNL RA SENSING INTR AMPL: 4 mV
MDC IDC SET LEADCHNL RA PACING AMPLITUDE: 1.5 V
MDC IDC SET LEADCHNL RV PACING PULSEWIDTH: 0.4 ms
MDC IDC STAT BRADY AP VP PERCENT: 1 %

## 2013-12-31 NOTE — Patient Instructions (Signed)
Remote monitoring is used to monitor your pacemaker from home. This monitoring reduces the number of office visits required to check your device to one time per year. It allows us to keep an eye on the functioning of your device to ensure it is working properly. You are scheduled for a device check from home on 04-03-2014. You may send your transmission at any time that day. If you have a wireless device, the transmission will be sent automatically. After your physician reviews your transmission, you will receive a postcard with your next transmission date.   Your physician recommends that you schedule a follow-up appointment in: 12 months with Dr.Croitoru  

## 2013-12-31 NOTE — Assessment & Plan Note (Signed)
No neurological events since 2006, on chronic treatment with aspirin.

## 2013-12-31 NOTE — Assessment & Plan Note (Signed)
Unclear if this is secondary to hypertension or a primary cardiomyopathy. Her blood pressure is really not that high.

## 2013-12-31 NOTE — Assessment & Plan Note (Signed)
Borderline elevated blood pressure today, usually much better, no changes made to her medications. Consider switching back to an ACE inhibitor to avoid problems with ankle edema.

## 2013-12-31 NOTE — Assessment & Plan Note (Signed)
Normal device function. No permanent reprogramming changes were necessary. She'll continue every 3 month [employer] monitor downloads. We'll see her back in the office in one year

## 2013-12-31 NOTE — Progress Notes (Signed)
Patient ID: LY WASS, female   DOB: Sep 28, 1955, 58 y.o.   MRN: [id number]      Reason for office visit Pacemaker followup, hypertension  Kellie Velez is here to establish new cardiology followup after Dr. Lowella Fairy retirement. I first met her in October 2012 and she underwent an elective pacemaker generator change out (dual chamber Medtronic Adapta).  She initially received a pacemaker for symptomatic bradycardia (in the 30s) in 2006 when she was only 58 years old. She had a TIA around that time as well but no subsequent neurological events. She has treated hypertension. She had a normal nuclear stress test in 2008. She has normal left ventricular systolic function but severe left ventricular hypertrophy by echo in December 2013. Doppler parameters suggest mild diastolic dysfunction. The septum is substantially thicker than the posterior wall according to the measurements on that echo, but there are no other features to suggest hypertrophic obstructive cardiomyopathy. The left atrium was nondilated.  She has long-standing bipolar disorder on chronic treatment with lithium.  Pacemaker interrogation today shows normal device function. The underlying rhythm is sinus bradycardia in the low 40s. Atrial pacing occurs 99% of the time. Ventricular pacing only occurs about 1% of the time. There have been no meaningful recorded arrhythmia events. Her only complaint is mild ankle edema, probably related to treatment with amlodipine. She was switched to lisinopril at one point and the swelling was much better, but she is now back on amlodipine. Her blood pressure slightly elevated today (144/78), but usually her blood pressure is in the 118-120/70 range.     Allergies  Allergen Reactions  . Floxin [Ofloxacin] Nausea And Vomiting and Rash  . Plavix [Clopidogrel Bisulfate]     "stomach pain"  . Epinephrine Other (See Comments)    "makes her feel excited"    Current Outpatient Prescriptions    Medication Sig Dispense Refill  . amLODipine (NORVASC) 5 MG tablet Take 5 mg by mouth daily.       Marland Kitchen aspirin 81 MG chewable tablet Chew 81 mg by mouth daily.      . diazepam (VALIUM) 5 MG tablet Take 5 mg by mouth at bedtime as needed. For anxiety      . ergocalciferol (VITAMIN D2) 50000 UNITS capsule Take 50,000 Units by mouth once a week. On fridays      . folic acid (FOLVITE) 324 MCG tablet Take 800 mcg by mouth daily.       . furosemide (LASIX) 40 MG tablet Take 40 mg by mouth 2 (two) times daily.      Marland Kitchen HYDROcodone-acetaminophen (NORCO) 5-325 MG per tablet Take 1 tablet by mouth every 6 (six) hours as needed. For pain      . lithium carbonate 300 MG capsule Take 300 mg by mouth 2 (two) times daily with a meal.       . meclizine (ANTIVERT) 50 MG tablet Take 0.5 tablets (25 mg total) by mouth 3 (three) times daily as needed for dizziness.  30 tablet  0  . vitamin B-12 (CYANOCOBALAMIN) 500 MCG tablet Take 500 mcg by mouth every other day.        No current facility-administered medications for this visit.    Past Medical History  Diagnosis Date  . Arthritis   . Hypertension   . Nerve damage   . Osteoporosis   . Hearing loss   . Abdominal pain   . Stroke     TIA's  . Bradycardia, sinus, persistent, severe   .  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 Bipolar 1 disorder   . Heart murmur 08/13/2012    EF 60-65%,severe LVH,Mild AOV regurg, MR,trivial TR  . Mild carotid artery disease 05/05/2005,08/13/2012    left ICA 0-49% diameter reduction 2006; bilateral fibrous plaque mild 2013  . LVH (left ventricular hypertrophy)     Past Surgical History  Procedure Laterality Date  . Cholecystectomy  1980  . Appendectomy  1983  . Total knee arthroplasty  2003    left  . Permanent pacemaker insertion  01/01/2005    Medtronic EnRhythm  . Permanent pacemaker generator change  06/08/2011    Medtronic Adapta  . Tee without  cardioversion  01/06/2005    no cardiac source of embolus    No family history on file.  History   Social History  . Marital Status: Married    Spouse Name: N/A    Number of Children: N/A  . Years of Education: N/A   Occupational History  . Not on file.   Social History Main Topics  . Smoking status: Current Every Day Smoker -- 0.50 packs/day    Types: Cigarettes  . Smokeless tobacco: Not on file  . Alcohol Use: No  . Drug Use: No  . Sexual Activity:    Other Topics Concern  . Not on file   Social History Narrative  . No narrative on file    Review of systems: The patient specifically denies any chest pain at rest or with exertion, dyspnea at rest or with exertion, orthopnea, paroxysmal nocturnal dyspnea, syncope, palpitations, focal neurological deficits, intermittent claudication, unexplained weight gain, cough, hemoptysis or wheezing.  The patient also denies abdominal pain, nausea, vomiting, dysphagia, diarrhea, constipation, polyuria, polydipsia, dysuria, hematuria, frequency, urgency, abnormal bleeding or bruising, fever, chills, unexpected weight changes, mood swings, change in skin or hair texture, change in voice quality, auditory or visual problems, allergic reactions or rashes, new musculoskeletal complaints other than usual "aches and pains".   PHYSICAL EXAM BP 144/78  Pulse 87  Ht 5' 6"  (1.676 m)  Wt 250 lb (113.399 kg)  BMI 40.37 kg/m2  General: Alert, oriented x3, no distress Head: no evidence of trauma, PERRL, EOMI, no exophtalmos or lid lag, no myxedema, no xanthelasma; normal ears, nose and oropharynx Neck: normal jugular venous pulsations and no hepatojugular reflux; brisk carotid pulses without delay and no carotid bruits Chest: clear to auscultation, no signs of consolidation by percussion or palpation, normal fremitus, symmetrical and full respiratory excursions Cardiovascular: normal position and quality of the apical impulse, regular rhythm,  normal first and second heart sounds, no murmurs, rubs or gallops Abdomen: no tenderness or distention, no masses by palpation, no abnormal pulsatility or arterial bruits, normal bowel sounds, no hepatosplenomegaly Extremities: no clubbing, cyanosis ; 2+ symmetrical ankle edema; 2+ radial, ulnar and brachial pulses bilaterally; 2+ right femoral, posterior tibial and dorsalis pedis pulses; 2+ left femoral, posterior tibial and dorsalis pedis pulses; no subclavian or femoral bruits Neurological: grossly nonfocal   EKG: Atrial paced, ventricular sensed, otherwise normal  BMET    Component Value Date/Time   NA 137 01/04/2013 0045   K 3.2* 01/04/2013 0045   CL 102 01/04/2013 0045   CO2 24 01/04/2013 0045   GLUCOSE 137* 01/04/2013 0045   BUN 22 01/04/2013 0045   CREATININE 1.10 01/04/2013 0045   CALCIUM 9.7 01/04/2013 0045   GFRNONAA 54* 01/04/2013 0045   GFRAA 62* 01/04/2013 0045  ASSESSMENT AND PLAN Pacemaker Normal device function. No permanent reprogramming changes were necessary. She'll continue every 3 month [employer] monitor downloads. We'll see her back in the office in one year  Unspecified essential hypertension Borderline elevated blood pressure today, usually much better, no changes made to her medications. Consider switching back to an ACE inhibitor to avoid problems with ankle edema.  TIA (transient ischemic attack) No neurological events since 2006, on chronic treatment with aspirin.  LVH (left ventricular hypertrophy) Unclear if this is secondary to hypertension or a primary cardiomyopathy. Her blood pressure is really not that high.     Kellie Velez  Sanda Klein, MD, [facility] [phone] office [phone] pager

## 2014-04-03 ENCOUNTER — Ambulatory Visit (INDEPENDENT_AMBULATORY_CARE_PROVIDER_SITE_OTHER): Payer: Medicare Other | Admitting: *Deleted

## 2014-04-03 DIAGNOSIS — I498 Other specified cardiac arrhythmias: Secondary | ICD-10-CM

## 2014-04-03 DIAGNOSIS — R001 Bradycardia, unspecified: Secondary | ICD-10-CM

## 2014-04-03 NOTE — Progress Notes (Signed)
Remote pacemaker transmission.   

## 2014-04-04 ENCOUNTER — Telehealth: Payer: Self-pay | Admitting: Cardiovascular Disease

## 2014-04-04 NOTE — Telephone Encounter (Signed)
Please call,need to talk to you about her transitting of her pacemaker.Her neck,shoulder and back is hurting real bad.

## 2014-04-04 NOTE — Telephone Encounter (Signed)
Spoke with pt, for the last 2 months she has been helping in a nrsg home moving wheel chairs. She has noticed a discomfort in her shoulders and neck that will get worse with certain movements. She wanted to make sure she had not hurt her pacemaker. Reassurance given to pt. She is going to try her naprosyn for the discomfort.

## 2014-04-06 LAB — MDC_IDC_ENUM_SESS_TYPE_REMOTE
Battery Impedance: 137 Ohm
Battery Voltage: 2.8 V
Brady Statistic AP VP Percent: 1 %
Brady Statistic AP VS Percent: 98 %
Brady Statistic AS VS Percent: 0 %
Date Time Interrogation Session: 20150820033836
Lead Channel Impedance Value: 524 Ohm
Lead Channel Impedance Value: 607 Ohm
Lead Channel Pacing Threshold Pulse Width: 0.4 ms
Lead Channel Pacing Threshold Pulse Width: 0.4 ms
Lead Channel Setting Pacing Amplitude: 2 V
Lead Channel Setting Pacing Pulse Width: 0.4 ms
MDC IDC MSMT BATTERY REMAINING LONGEVITY: 145 mo
MDC IDC MSMT LEADCHNL RA PACING THRESHOLD AMPLITUDE: 0.625 V
MDC IDC MSMT LEADCHNL RV PACING THRESHOLD AMPLITUDE: 0.875 V
MDC IDC MSMT LEADCHNL RV SENSING INTR AMPL: 22.4 mV
MDC IDC SET LEADCHNL RA PACING AMPLITUDE: 1.5 V
MDC IDC SET LEADCHNL RV SENSING SENSITIVITY: 5.6 mV
MDC IDC STAT BRADY AS VP PERCENT: 0 %

## 2014-04-15 ENCOUNTER — Encounter: Payer: Self-pay | Admitting: Cardiology

## 2014-04-18 ENCOUNTER — Encounter: Payer: Self-pay | Admitting: Cardiovascular Disease

## 2014-04-28 ENCOUNTER — Encounter: Payer: Self-pay | Admitting: Cardiovascular Disease

## 2014-07-07 ENCOUNTER — Ambulatory Visit (INDEPENDENT_AMBULATORY_CARE_PROVIDER_SITE_OTHER): Payer: Medicare Other | Admitting: *Deleted

## 2014-07-07 DIAGNOSIS — R001 Bradycardia, unspecified: Secondary | ICD-10-CM

## 2014-07-07 NOTE — Progress Notes (Signed)
Remote pacemaker transmission.   

## 2014-07-08 LAB — MDC_IDC_ENUM_SESS_TYPE_REMOTE
Battery Remaining Longevity: 147 mo
Brady Statistic AP VP Percent: 2 %
Brady Statistic AS VS Percent: 0 %
Date Time Interrogation Session: 20151123042520
Lead Channel Impedance Value: 585 Ohm
Lead Channel Impedance Value: 635 Ohm
Lead Channel Pacing Threshold Pulse Width: 0.4 ms
Lead Channel Sensing Intrinsic Amplitude: 16 mV
Lead Channel Setting Pacing Amplitude: 1.5 V
Lead Channel Setting Pacing Amplitude: 2 V
Lead Channel Setting Sensing Sensitivity: 5.6 mV
MDC IDC MSMT BATTERY IMPEDANCE: 137 Ohm
MDC IDC MSMT BATTERY VOLTAGE: 2.8 V
MDC IDC MSMT LEADCHNL RA PACING THRESHOLD AMPLITUDE: 0.625 V
MDC IDC MSMT LEADCHNL RA PACING THRESHOLD PULSEWIDTH: 0.4 ms
MDC IDC MSMT LEADCHNL RV PACING THRESHOLD AMPLITUDE: 1 V
MDC IDC SET LEADCHNL RV PACING PULSEWIDTH: 0.4 ms
MDC IDC STAT BRADY AP VS PERCENT: 97 %
MDC IDC STAT BRADY AS VP PERCENT: 0 %

## 2014-07-11 ENCOUNTER — Encounter (HOSPITAL_COMMUNITY): Payer: Self-pay | Admitting: *Deleted

## 2014-07-11 ENCOUNTER — Emergency Department (HOSPITAL_COMMUNITY)
Admission: EM | Admit: 2014-07-11 | Discharge: 2014-07-11 | Disposition: A | Discharge status: Home / Self Care | Payer: Medicare Other | Attending: Emergency Medicine | Admitting: Emergency Medicine

## 2014-07-11 DIAGNOSIS — R011 Cardiac murmur, unspecified: Secondary | ICD-10-CM | POA: Insufficient documentation

## 2014-07-11 DIAGNOSIS — K5900 Constipation, unspecified: Secondary | ICD-10-CM | POA: Insufficient documentation

## 2014-07-11 DIAGNOSIS — I2510 Atherosclerotic heart disease of native coronary artery without angina pectoris: Secondary | ICD-10-CM | POA: Insufficient documentation

## 2014-07-11 DIAGNOSIS — Z8673 Personal history of transient ischemic attack (TIA), and cerebral infarction without residual deficits: Secondary | ICD-10-CM | POA: Diagnosis not present

## 2014-07-11 DIAGNOSIS — F3160 Bipolar disorder, current episode mixed, unspecified: Secondary | ICD-10-CM

## 2014-07-11 DIAGNOSIS — Z7982 Long term (current) use of aspirin: Secondary | ICD-10-CM | POA: Insufficient documentation

## 2014-07-11 DIAGNOSIS — F3163 Bipolar disorder, current episode mixed, severe, without psychotic features: Secondary | ICD-10-CM | POA: Diagnosis not present

## 2014-07-11 DIAGNOSIS — R109 Unspecified abdominal pain: Secondary | ICD-10-CM | POA: Diagnosis present

## 2014-07-11 DIAGNOSIS — M1990 Unspecified osteoarthritis, unspecified site: Secondary | ICD-10-CM | POA: Insufficient documentation

## 2014-07-11 DIAGNOSIS — Z79899 Other long term (current) drug therapy: Secondary | ICD-10-CM | POA: Diagnosis not present

## 2014-07-11 DIAGNOSIS — H9190 Unspecified hearing loss, unspecified ear: Secondary | ICD-10-CM | POA: Insufficient documentation

## 2014-07-11 DIAGNOSIS — I10 Essential (primary) hypertension: Secondary | ICD-10-CM | POA: Diagnosis not present

## 2014-07-11 DIAGNOSIS — R002 Palpitations: Secondary | ICD-10-CM | POA: Insufficient documentation

## 2014-07-11 DIAGNOSIS — Z720 Tobacco use: Secondary | ICD-10-CM | POA: Insufficient documentation

## 2014-07-11 LAB — COMPREHENSIVE METABOLIC PANEL
ALT: 11 U/L (ref 0–35)
ANION GAP: 13 (ref 5–15)
AST: 11 U/L (ref 0–37)
Albumin: 3.8 g/dL (ref 3.5–5.2)
Alkaline Phosphatase: 63 U/L (ref 39–117)
BUN: 26 mg/dL — AB (ref 6–23)
CALCIUM: 10.2 mg/dL (ref 8.4–10.5)
CO2: 23 mEq/L (ref 19–32)
CREATININE: 1.19 mg/dL — AB (ref 0.50–1.10)
Chloride: 105 mEq/L (ref 96–112)
GFR calc non Af Amer: 49 mL/min — ABNORMAL LOW (ref >90)
GFR, EST AFRICAN AMERICAN: 56 mL/min — AB (ref >90)
GLUCOSE: 95 mg/dL (ref 70–99)
Potassium: 4.6 mEq/L (ref 3.7–5.3)
Sodium: 141 mEq/L (ref 137–147)
TOTAL PROTEIN: 6.6 g/dL (ref 6.0–8.3)
Total Bilirubin: 0.2 mg/dL — ABNORMAL LOW (ref 0.3–1.2)

## 2014-07-11 LAB — URINALYSIS, ROUTINE W REFLEX MICROSCOPIC
BILIRUBIN URINE: NEGATIVE
Glucose, UA: NEGATIVE mg/dL
Hgb urine dipstick: NEGATIVE
Ketones, ur: NEGATIVE mg/dL
Leukocytes, UA: NEGATIVE
Nitrite: NEGATIVE
Protein, ur: NEGATIVE mg/dL
SPECIFIC GRAVITY, URINE: 1.008 (ref 1.005–1.030)
Urobilinogen, UA: 0.2 mg/dL (ref 0.0–1.0)
pH: 5.5 (ref 5.0–8.0)

## 2014-07-11 LAB — CBC WITH DIFFERENTIAL/PLATELET
BASOS ABS: 0 10*3/uL (ref 0.0–0.1)
Basophils Relative: 0 % (ref 0–1)
EOS ABS: 0.4 10*3/uL (ref 0.0–0.7)
EOS PCT: 3 % (ref 0–5)
HCT: 42.3 % (ref 36.0–46.0)
HEMOGLOBIN: 13.8 g/dL (ref 12.0–15.0)
LYMPHS PCT: 21 % (ref 12–46)
Lymphs Abs: 2.8 10*3/uL (ref 0.7–4.0)
MCH: 29.1 pg (ref 26.0–34.0)
MCHC: 32.6 g/dL (ref 30.0–36.0)
MCV: 89.2 fL (ref 78.0–100.0)
MONO ABS: 0.9 10*3/uL (ref 0.1–1.0)
Monocytes Relative: 7 % (ref 3–12)
Neutro Abs: 9.3 10*3/uL — ABNORMAL HIGH (ref 1.7–7.7)
Neutrophils Relative %: 69 % (ref 43–77)
PLATELETS: 196 10*3/uL (ref 150–400)
RBC: 4.74 MIL/uL (ref 3.87–5.11)
RDW: 14.7 % (ref 11.5–15.5)
WBC: 13.4 10*3/uL — ABNORMAL HIGH (ref 4.0–10.5)

## 2014-07-11 LAB — LITHIUM LEVEL: Lithium Lvl: 0.53 mEq/L — ABNORMAL LOW (ref 0.80–1.40)

## 2014-07-11 NOTE — Discharge Instructions (Signed)
Bipolar Disorder Your lithium level was low today at 0.53. The therapeutic range is 0.8-1.4. Call Dr. Jeannetta NapElkins on Monday, 07/14/2014. He may need to adjust her medications. Tell him that you missed today's dose and that the extended release type of medication does not agree with you as well as the typelithium that you had taken in the past. Return if you feel worse for any reason. If you feel as if you might harm herself or someone else call 911 immediately Bipolar disorder is a mental illness. The term bipolar disorder actually is used to describe a group of disorders that all share varying degrees of emotional highs and lows that can interfere with daily functioning, such as work, school, or relationships. Bipolar disorder also can lead to drug abuse, hospitalization, and suicide. The emotional highs of bipolar disorder are periods of elation or irritability and high energy. These highs can range from a mild form (hypomania) to a severe form (mania). People experiencing episodes of hypomania may appear energetic, excitable, and highly productive. People experiencing mania may behave impulsively or erratically. They often make poor decisions. They may have difficulty sleeping. The most severe episodes of mania can involve having very distorted beliefs or perceptions about the world and seeing or hearing things that are not real (psychotic delusions and hallucinations).  The emotional lows of bipolar disorder (depression) also can range from mild to severe. Severe episodes of bipolar depression can involve psychotic delusions and hallucinations. Sometimes people with bipolar disorder experience a state of mixed mood. Symptoms of hypomania or mania and depression are both present during this mixed-mood episode. SIGNS AND SYMPTOMS There are signs and symptoms of the episodes of hypomania and mania as well as the episodes of depression. The signs and symptoms of hypomania and mania are similar but vary in  severity. They include:  Inflated self-esteem or feeling of increased self-confidence.  Decreased need for sleep.  Unusual talkativeness (rapid or pressured speech) or the feeling of a need to keep talking.  Sensation of racing thoughts or constant talking, with quick shifts between topics that may or may not be related (flight of ideas).  Decreased ability to focus or concentrate.  Increased purposeful activity, such as work, studies, or social activity, or nonproductive activity, such as pacing, squirming and fidgeting, or finger and toe tapping.  Impulsive behavior and use of poor judgment, resulting in high-risk activities, such as having unprotected sex or spending excessive amounts of money. Signs and symptoms of depression include the following:   Feelings of sadness, hopelessness, or helplessness.  Frequent or uncontrollable episodes of crying.  Lack of feeling anything or caring about anything.  Difficulty sleeping or sleeping too much.  Inability to enjoy the things you used to enjoy.   Desire to be alone all the time.   Feelings of guilt or worthlessness.  Lack of energy or motivation.   Difficulty concentrating, remembering, or making decisions.  Change in appetite or weight beyond normal fluctuations.  Thoughts of death or the desire to harm yourself. DIAGNOSIS  Bipolar disorder is diagnosed through an assessment by your caregiver. Your caregiver will ask questions about your emotional episodes. There are two main types of bipolar disorder. People with type I bipolar disorder have manic episodes with or without depressive episodes. People with type II bipolar disorder have hypomanic episodes and major depressive episodes, which are more serious than mild depression. The type of bipolar disorder you have can make an important difference in how your illness is monitored  and treated. Your caregiver may ask questions about your medical history and use of alcohol  or drugs, including prescription medication. Certain medical conditions and substances also can cause emotional highs and lows that resemble bipolar disorder (secondary bipolar disorder).  TREATMENT  Bipolar disorder is a long-term illness. It is best controlled with continuous treatment rather than treatment only when symptoms occur. The following treatments can be prescribed for bipolar disorders:  Medication--Medication can be prescribed by a doctor that is an expert in treating mental disorders (psychiatrists). Medications called mood stabilizers are usually prescribed to help control the illness. Other medications are sometimes added if symptoms of mania, depression, or psychotic delusions and hallucinations occur despite the use of a mood stabilizer.  Talk therapy--Some forms of talk therapy are helpful in providing support, education, and guidance. A combination of medication and talk therapy is best for managing the disorder over time. A procedure in which electricity is applied to your brain through your scalp (electroconvulsive therapy) is used in cases of severe mania when medication and talk therapy do not work or work too slowly. Document Released: 11/07/2000 Document Revised: 11/26/2012 Document Reviewed: 08/27/2012 [facility] Patient Information 2015 [employer] [place]. This information is not intended to replace advice given to you by your health care provider. Make sure you discuss any questions you have with your health care provider.

## 2014-07-11 NOTE — ED Notes (Signed)
Pt denies abdominal pain, denies chest pain, states she wants to get her lithium level checked.

## 2014-07-11 NOTE — ED Notes (Signed)
MD Jacubowitz at the bedside   

## 2014-07-11 NOTE — ED Provider Notes (Signed)
CSN: [id number]     Arrival date & time 07/11/14  1117 History   First MD Initiated Contact with Patient 07/11/14 1141     Chief Complaint  Patient presents with  . Abdominal Pain     (Consider location/radiation/quality/duration/timing/severity/associated sxs/prior Treatment) HPI Patient reports that she came to get her lithium level checked. Her symptoms include dry mouth and feeling "shaky" she had a fluttering in her heart this morning which lasted for 1 second. She denies abdominal pain denies chest pain no other associated symptoms she is presently hungry no treatment prior to coming here. Nothing makes symptoms better or worse. No fever Past Medical History  Diagnosis Date  . Arthritis   . Hypertension   . Nerve damage   . Osteoporosis   . Hearing loss   . Abdominal pain   . Stroke     TIA's  . Bradycardia, sinus, persistent, severe   . 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 Bipolar 1 disorder   . Heart murmur 08/13/2012    EF 60-65%,severe LVH,Mild AOV regurg, MR,trivial TR  . Mild carotid artery disease 05/05/2005,08/13/2012    left ICA 0-49% diameter reduction 2006; bilateral fibrous plaque mild 2013  . LVH (left ventricular hypertrophy)    Past Surgical History  Procedure Laterality Date  . Cholecystectomy  1980  . Appendectomy  1983  . Total knee arthroplasty  2003    left  . Permanent pacemaker insertion  01/01/2005    Medtronic EnRhythm  . Permanent pacemaker generator change  06/08/2011    Medtronic Adapta  . Tee without cardioversion  01/06/2005    no cardiac source of embolus   No family history on file. History  Substance Use Topics  . Smoking status: Current Every Day Smoker -- 0.50 packs/day    Types: Cigarettes  . Smokeless tobacco: Not on file  . Alcohol Use: No   OB History    No data available     Review of Systems  Constitutional: Negative.   HENT: Negative.   Respiratory:  Negative.   Cardiovascular: Positive for palpitations.       "Fluttering" of heart lasting 1 second  Gastrointestinal: Positive for constipation.       Reported constipation yesterday, treated herself with an enema, no longer constipated  Endocrine:       Feels thirsty  Musculoskeletal: Negative.   Skin: Negative.   Neurological: Negative.   Psychiatric/Behavioral: Negative.   All other systems reviewed and are negative.     Allergies  Floxin; Plavix; and Epinephrine  Home Medications   Prior to Admission medications   Medication Sig Start Date End Date Taking? Authorizing Provider  aspirin 81 MG chewable tablet Chew 81 mg by mouth daily.   Yes Historical Provider, MD  diazepam (VALIUM) 5 MG tablet Take 5 mg by mouth at bedtime as needed for anxiety.    Yes Historical Provider, MD  ergocalciferol (VITAMIN D2) 50000 UNITS capsule Take 50,000 Units by mouth once a week. On fridays   Yes Historical Provider, MD  folic acid (FOLVITE) 800 MCG tablet Take 800 mcg by mouth daily.    Yes Historical Provider, MD  HYDROcodone-acetaminophen (NORCO) 5-325 MG per tablet Take 1 tablet by mouth every 6 (six) hours as needed for severe pain.  04/08/11  Yes Historical Provider, MD  lisinopril (PRINIVIL,ZESTRIL) 10 MG tablet Take 10 mg by mouth daily. 06/19/14  Yes Historical Provider, MD  lithium carbonate (ESKALITH) 450  MG CR tablet Take 450 mg by mouth daily. 06/10/14  Yes Historical Provider, MD  lithium carbonate 300 MG capsule Take 300 mg by mouth 2 (two) times daily with a meal.  05/17/11   Historical Provider, MD  meclizine (ANTIVERT) 50 MG tablet Take 0.5 tablets (25 mg total) by mouth 3 (three) times daily as needed for dizziness. 07/20/12   Olivia Mackie, MD   BP 151/82 mmHg  Pulse 72  Temp(Src) 98.6 F (37 C) (Oral)  Resp 18  Ht 5\' 7"  (1.702 m)  Wt 230 lb (104.327 kg)  BMI 36.01 kg/m2  SpO2 100% Physical Exam  Constitutional: She appears well-developed and well-nourished.  HENT:   Head: Normocephalic and atraumatic.  Eyes: Conjunctivae are normal. Pupils are equal, round, and reactive to light.  Neck: Neck supple. No tracheal deviation present. No thyromegaly present.  Cardiovascular: Normal rate and regular rhythm.   No murmur heard. Pulmonary/Chest: Effort normal and breath sounds normal.  Abdominal: Soft. Bowel sounds are normal. She exhibits no distension. There is no tenderness.  Musculoskeletal: Normal range of motion. She exhibits no edema or tenderness.  Neurological: She is alert. Coordination normal.  Skin: Skin is warm and dry. No rash noted.  Psychiatric: She has a normal mood and affect.  Nursing note and vitals reviewed.   ED Course  Procedures (including critical care time) Labs Review Labs Reviewed - No data to display  Imaging Review No results found.   EKG Interpretation   Date/Time:  Friday July 11 2014 12:10:00 EST Ventricular Rate:  70 PR Interval:  196 QRS Duration: 93 QT Interval:  408 QTC Calculation: 440 R Axis:   74 Text Interpretation:  Sinus rhythm Low voltage, precordial leads PREVIOUS  TRACING PACED Confirmed by Ethelda Chick  MD, Peightyn Roberson [phone]) on 07/11/2014  12:21:16 PM      Results for orders placed or performed during the hospital encounter of 07/11/14  Comprehensive metabolic panel  Result Value Ref Range   Sodium 141 137 - 147 mEq/L   Potassium 4.6 3.7 - 5.3 mEq/L   Chloride 105 96 - 112 mEq/L   CO2 23 19 - 32 mEq/L   Glucose, Bld 95 70 - 99 mg/dL   BUN 26 (H) 6 - 23 mg/dL   Creatinine, Ser 6.04 (H) 0.50 - 1.10 mg/dL   Calcium 54.0 8.4 - 98.1 mg/dL   Total Protein 6.6 6.0 - 8.3 g/dL   Albumin 3.8 3.5 - 5.2 g/dL   AST 11 0 - 37 U/L   ALT 11 0 - 35 U/L   Alkaline Phosphatase 63 39 - 117 U/L   Total Bilirubin 0.2 (L) 0.3 - 1.2 mg/dL   GFR calc non Af Amer 49 (L) >90 mL/min   GFR calc Af Amer 56 (L) >90 mL/min   Anion gap 13 5 - 15  CBC with Differential  Result Value Ref Range   WBC 13.4 (H) 4.0 -  10.5 K/uL   RBC 4.74 3.87 - 5.11 MIL/uL   Hemoglobin 13.8 12.0 - 15.0 g/dL   HCT 19.1 47.8 - 29.5 %   MCV 89.2 78.0 - 100.0 fL   MCH 29.1 26.0 - 34.0 pg   MCHC 32.6 30.0 - 36.0 g/dL   RDW 62.1 30.8 - 65.7 %   Platelets 196 150 - 400 K/uL   Neutrophils Relative % 69 43 - 77 %   Neutro Abs 9.3 (H) 1.7 - 7.7 K/uL   Lymphocytes Relative 21 12 - 46 %  Lymphs Abs 2.8 0.7 - 4.0 K/uL   Monocytes Relative 7 3 - 12 %   Monocytes Absolute 0.9 0.1 - 1.0 K/uL   Eosinophils Relative 3 0 - 5 %   Eosinophils Absolute 0.4 0.0 - 0.7 K/uL   Basophils Relative 0 0 - 1 %   Basophils Absolute 0.0 0.0 - 0.1 K/uL  Lithium level  Result Value Ref Range   Lithium Lvl 0.53 (L) 0.80 - 1.40 mEq/L  Urinalysis, Routine w reflex microscopic  Result Value Ref Range   Color, Urine YELLOW YELLOW   APPearance CLEAR CLEAR   Specific Gravity, Urine 1.008 1.005 - 1.030   pH 5.5 5.0 - 8.0   Glucose, UA NEGATIVE NEGATIVE mg/dL   Hgb urine dipstick NEGATIVE NEGATIVE   Bilirubin Urine NEGATIVE NEGATIVE   Ketones, ur NEGATIVE NEGATIVE mg/dL   Protein, ur NEGATIVE NEGATIVE mg/dL   Urobilinogen, UA 0.2 0.0 - 1.0 mg/dL   Nitrite NEGATIVE NEGATIVE   Leukocytes, UA NEGATIVE NEGATIVE   No results found.  2:10 PM patient resting comfortably, asymptomatic MDM Final diagnoses:  None   Patient reports that she did not take her lithium dose today. She states "I was scared to because I was so hyper yesterday" she admits to feeling mildly depressed however is not suicidal she states she lost her husband one year ago and it is a tough time of year. She feels that the extended dose of lithium that she is currently taking has not done as well for her as lithium she had taken in the past. I've requested that she call Dr. Jeannetta NapElkins to adjust her medications  Diagnosis #1 bipolar disorder #2 subtherapeutic lithium level #3 mild renal insufficiency    Doug SouSam Rocio Roam, MD 07/11/14 1419

## 2014-07-11 NOTE — ED Notes (Signed)
Family at the bedside.

## 2014-07-11 NOTE — ED Notes (Signed)
Pt states she has been decreased from lithium and feels like she is in withdrawls.  Pt states she is staggering, feeling cold, manic mood, depressed and wants to get checked out.

## 2014-07-19 ENCOUNTER — Emergency Department (HOSPITAL_COMMUNITY)
Admission: EM | Admit: 2014-07-19 | Discharge: 2014-07-21 | Disposition: A | Discharge status: Other Acute Inpatient Hospital | Payer: Medicare Other | Attending: Emergency Medicine | Admitting: Emergency Medicine

## 2014-07-19 ENCOUNTER — Encounter (HOSPITAL_COMMUNITY): Payer: Self-pay | Admitting: Oncology

## 2014-07-19 DIAGNOSIS — Z8673 Personal history of transient ischemic attack (TIA), and cerebral infarction without residual deficits: Secondary | ICD-10-CM | POA: Diagnosis not present

## 2014-07-19 DIAGNOSIS — I10 Essential (primary) hypertension: Secondary | ICD-10-CM | POA: Insufficient documentation

## 2014-07-19 DIAGNOSIS — R443 Hallucinations, unspecified: Secondary | ICD-10-CM | POA: Diagnosis present

## 2014-07-19 DIAGNOSIS — Z720 Tobacco use: Secondary | ICD-10-CM | POA: Diagnosis not present

## 2014-07-19 DIAGNOSIS — Z7982 Long term (current) use of aspirin: Secondary | ICD-10-CM | POA: Insufficient documentation

## 2014-07-19 DIAGNOSIS — F29 Unspecified psychosis not due to a substance or known physiological condition: Secondary | ICD-10-CM | POA: Diagnosis not present

## 2014-07-19 DIAGNOSIS — F319 Bipolar disorder, unspecified: Secondary | ICD-10-CM | POA: Insufficient documentation

## 2014-07-19 DIAGNOSIS — R011 Cardiac murmur, unspecified: Secondary | ICD-10-CM | POA: Insufficient documentation

## 2014-07-19 DIAGNOSIS — Z950 Presence of cardiac pacemaker: Secondary | ICD-10-CM | POA: Diagnosis not present

## 2014-07-19 DIAGNOSIS — M810 Age-related osteoporosis without current pathological fracture: Secondary | ICD-10-CM | POA: Insufficient documentation

## 2014-07-19 DIAGNOSIS — M1990 Unspecified osteoarthritis, unspecified site: Secondary | ICD-10-CM | POA: Diagnosis not present

## 2014-07-19 DIAGNOSIS — H9190 Unspecified hearing loss, unspecified ear: Secondary | ICD-10-CM | POA: Insufficient documentation

## 2014-07-19 DIAGNOSIS — F419 Anxiety disorder, unspecified: Secondary | ICD-10-CM | POA: Diagnosis not present

## 2014-07-19 DIAGNOSIS — Z79899 Other long term (current) drug therapy: Secondary | ICD-10-CM | POA: Insufficient documentation

## 2014-07-19 LAB — COMPREHENSIVE METABOLIC PANEL
ALT: 16 U/L (ref 0–35)
ANION GAP: 17 — AB (ref 5–15)
AST: 31 U/L (ref 0–37)
Albumin: 3.9 g/dL (ref 3.5–5.2)
Alkaline Phosphatase: 61 U/L (ref 39–117)
BILIRUBIN TOTAL: 0.5 mg/dL (ref 0.3–1.2)
BUN: 20 mg/dL (ref 6–23)
CHLORIDE: 102 meq/L (ref 96–112)
CO2: 19 meq/L (ref 19–32)
CREATININE: 1.42 mg/dL — AB (ref 0.50–1.10)
Calcium: 10.3 mg/dL (ref 8.4–10.5)
GFR calc Af Amer: 45 mL/min — ABNORMAL LOW (ref >90)
GFR, EST NON AFRICAN AMERICAN: 39 mL/min — AB (ref >90)
Glucose, Bld: 97 mg/dL (ref 70–99)
Potassium: 4.4 mEq/L (ref 3.7–5.3)
Sodium: 138 mEq/L (ref 137–147)
Total Protein: 6.6 g/dL (ref 6.0–8.3)

## 2014-07-19 LAB — RAPID URINE DRUG SCREEN, HOSP PERFORMED
Amphetamines: NOT DETECTED
BARBITURATES: NOT DETECTED
Benzodiazepines: NOT DETECTED
Cocaine: NOT DETECTED
Opiates: NOT DETECTED
Tetrahydrocannabinol: NOT DETECTED

## 2014-07-19 LAB — CBC
HCT: 37.6 % (ref 36.0–46.0)
Hemoglobin: 11.9 g/dL — ABNORMAL LOW (ref 12.0–15.0)
MCH: 28.5 pg (ref 26.0–34.0)
MCHC: 31.6 g/dL (ref 30.0–36.0)
MCV: 90 fL (ref 78.0–100.0)
Platelets: 203 10*3/uL (ref 150–400)
RBC: 4.18 MIL/uL (ref 3.87–5.11)
RDW: 15.1 % (ref 11.5–15.5)
WBC: 11.3 10*3/uL — AB (ref 4.0–10.5)

## 2014-07-19 LAB — LITHIUM LEVEL: LITHIUM LVL: 0.43 meq/L — AB (ref 0.80–1.40)

## 2014-07-19 LAB — SALICYLATE LEVEL: Salicylate Lvl: <2 mg/dL — ABNORMAL LOW (ref 2.8–20.0)

## 2014-07-19 LAB — ACETAMINOPHEN LEVEL: Acetaminophen (Tylenol), Serum: <15 ug/mL (ref 10–30)

## 2014-07-19 LAB — ETHANOL

## 2014-07-19 MED ORDER — ONDANSETRON HCL 4 MG PO TABS: 4.0000 mg | ORAL_TABLET | Freq: Three times a day (TID) | ORAL | Status: DC | PRN

## 2014-07-19 MED ORDER — ERGOCALCIFEROL 1.25 MG (50000 UT) PO CAPS: 50000.0000 [IU] | ORAL_CAPSULE | ORAL | Status: DC

## 2014-07-19 MED ORDER — QUETIAPINE FUMARATE 50 MG PO TABS
50.0000 mg | ORAL_TABLET | Freq: Three times a day (TID) | ORAL | Status: DC
  Administered 2014-07-19: 50 mg via ORAL
  Filled 2014-07-19 (×4): qty 1

## 2014-07-19 MED ORDER — DIAZEPAM 5 MG PO TABS
5.0000 mg | ORAL_TABLET | Freq: Every evening | ORAL | Status: DC | PRN
  Filled 2014-07-19: qty 1

## 2014-07-19 MED ORDER — ZIPRASIDONE MESYLATE 20 MG IM SOLR: 20.0000 mg | Freq: Once | INTRAMUSCULAR | Status: DC

## 2014-07-19 MED ORDER — LORAZEPAM 2 MG/ML IJ SOLN
1.0000 mg | Freq: Once | INTRAMUSCULAR | Status: AC
  Administered 2014-07-19: 1 mg via INTRAMUSCULAR
  Filled 2014-07-19: qty 1

## 2014-07-19 MED ORDER — ASPIRIN 81 MG PO CHEW
81.0000 mg | CHEWABLE_TABLET | Freq: Every day | ORAL | Status: DC
  Administered 2014-07-19 – 2014-07-21 (×2): 81 mg via ORAL
  Filled 2014-07-19 (×2): qty 1

## 2014-07-19 MED ORDER — NICOTINE 21 MG/24HR TD PT24
21.0000 mg | MEDICATED_PATCH | Freq: Every day | TRANSDERMAL | Status: DC
  Administered 2014-07-19: 21 mg via TRANSDERMAL
  Filled 2014-07-19: qty 1

## 2014-07-19 MED ORDER — WHITE PETROLATUM GEL
Status: DC | PRN
  Administered 2014-07-19: 12:00:00 via TOPICAL
  Filled 2014-07-19: qty 5
  Filled 2014-07-19: qty 28.35

## 2014-07-19 MED ORDER — IBUPROFEN 200 MG PO TABS
600.0000 mg | ORAL_TABLET | Freq: Three times a day (TID) | ORAL | Status: DC | PRN
  Filled 2014-07-19: qty 3

## 2014-07-19 MED ORDER — LITHIUM CARBONATE 300 MG PO CAPS
300.0000 mg | ORAL_CAPSULE | Freq: Two times a day (BID) | ORAL | Status: DC
  Administered 2014-07-19 – 2014-07-20 (×4): 300 mg via ORAL
  Filled 2014-07-19 (×4): qty 1

## 2014-07-19 MED ORDER — LORAZEPAM 1 MG PO TABS
1.0000 mg | ORAL_TABLET | Freq: Three times a day (TID) | ORAL | Status: DC | PRN
  Administered 2014-07-19 (×2): 1 mg via ORAL
  Filled 2014-07-19: qty 1

## 2014-07-19 MED ORDER — FOLIC ACID 800 MCG PO TABS
800.0000 ug | ORAL_TABLET | Freq: Every day | ORAL | Status: DC
  Administered 2014-07-19: 800 ug via ORAL

## 2014-07-19 MED ORDER — ALUM & MAG HYDROXIDE-SIMETH 200-200-20 MG/5ML PO SUSP: 30.0000 mL | ORAL | Status: DC | PRN

## 2014-07-19 MED ORDER — ZIPRASIDONE MESYLATE 20 MG IM SOLR
INTRAMUSCULAR | Status: AC
Start: 2014-07-19 — End: 2014-07-19
  Administered 2014-07-19: 20 mg
  Filled 2014-07-19: qty 20

## 2014-07-19 MED ORDER — ZOLPIDEM TARTRATE 10 MG PO TABS
10.0000 mg | ORAL_TABLET | Freq: Every evening | ORAL | Status: DC | PRN
  Administered 2014-07-19: 10 mg via ORAL

## 2014-07-19 MED ORDER — ZIPRASIDONE MESYLATE 20 MG IM SOLR
20.0000 mg | Freq: Once | INTRAMUSCULAR | Status: AC
  Administered 2014-07-19: 20 mg via INTRAMUSCULAR

## 2014-07-19 MED ORDER — LISINOPRIL 10 MG PO TABS
10.0000 mg | ORAL_TABLET | Freq: Every day | ORAL | Status: DC
  Filled 2014-07-19 (×2): qty 1

## 2014-07-19 MED ORDER — ACETAMINOPHEN 325 MG PO TABS: 650.0000 mg | ORAL_TABLET | ORAL | Status: DC | PRN

## 2014-07-19 MED ORDER — QUETIAPINE FUMARATE 50 MG PO TABS
ORAL_TABLET | ORAL | Status: AC
  Administered 2014-07-19: 50 mg via ORAL
  Filled 2014-07-19: qty 1

## 2014-07-19 MED ORDER — WHITE PETROLATUM GEL
Status: AC
  Filled 2014-07-19: qty 5

## 2014-07-19 MED ORDER — LITHIUM CARBONATE ER 450 MG PO TBCR: 450.0000 mg | EXTENDED_RELEASE_TABLET | Freq: Every day | ORAL | Status: DC
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
## 2014-07-19 NOTE — ED Notes (Signed)
Pt has shirt, pants, and underwear.   Pt has been seen and wanded by security.

## 2014-07-19 NOTE — ED Notes (Signed)
Bed: WA20 Expected date:  Expected time:  Means of arrival:  Comments: 58 yo F  Medical clearance

## 2014-07-19 NOTE — ED Notes (Addendum)
Pt constants needs to be redirected back to the room. Writer is constantly walking and getting up asking for people who are not there.  Pt is fighting with staff, and she is not aware of her environment. Pt is urinating on herself, pt is not even able to get up from the toilet without multiple staff members helping her get up.  Pt is wearing a diaper for incontinence.
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
## 2014-07-19 NOTE — ED Notes (Addendum)
Pt. Transferred to TCU after report to Ryder Systemravia RN

## 2014-07-19 NOTE — Progress Notes (Signed)
Pt's sister-in-law visited. She said pt recently stopped taking her medicine, that pt's Lithium dosage had recently been changed/possibly lowered. [facility] and that [facility]

## 2014-07-19 NOTE — BH Assessment (Signed)
Kellie Velez, [facility], confirms adult unit is at capacity. Contacted the following facilities for placement:  BED AVAILABLE, FAXED CLINICAL INFORMATION: [facility], per Dot LanesKrista  AT CAPACITY: [facility], per Huel [facility], per [facility], per Reatha Harpsebra  NO RESPONSE: [facility]   [address][name] [name] Patsy BaltimoreWarrick Jr, WisconsinLPC, [facility] Triage Specialist [phone]

## 2014-07-19 NOTE — Progress Notes (Signed)
Kellie Velez was disoriented and confused this a.m. She has been able to tell me her name once this morning, but otherwise will say she is "Kellie Velez" or "Kellie Velez" or something inscrutable. She said Kellie Velez is her father. She required redirection to eat and has tried to wander into other patients' rooms. She has been redirectable. Will continue to monitor for needs and safety.
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
## 2014-07-19 NOTE — ED Provider Notes (Signed)
CSN: [id number]     Arrival date & time 07/19/14  0002 History   First MD Initiated Contact with Patient 07/19/14 0009     Chief Complaint  Patient presents with  . Hallucinations    has not had lithium in 3 days.   Level V caveat for psychiatric disorder  (Consider location/radiation/quality/duration/timing/severity/associated sxs/prior Treatment) HPI  [facility]. Her family called because they felt patient has not been taking her lithium. Patient has a history of bipolar disorder. History is unobtainable from the patient. She keeps talking about Elvis and things not related to our conversation. She is hard to keep directed and keeps trying to get out of bed. She talks to herself as if she is another person stating "Corrie DandyMary wouldn't remember" when I ask her certain questions. She also one point states "I'm just switching". She also states that Elvis wants to see. She also states Elvis doesn't want the bill. She states she knows her children are fine.  PCP Dr Jeannetta NapElkins  Past Medical History  Diagnosis Date  . Arthritis   . Hypertension   . Nerve damage   . Osteoporosis   . Hearing loss   . Abdominal pain   . Stroke     TIA's  . Bradycardia, sinus, persistent, severe   . 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 Bipolar 1 disorder   . Heart murmur 08/13/2012    EF 60-65%,severe LVH,Mild AOV regurg, MR,trivial TR  . Mild carotid artery disease 05/05/2005,08/13/2012    left ICA 0-49% diameter reduction 2006; bilateral fibrous plaque mild 2013  . LVH (left ventricular hypertrophy)    Past Surgical History  Procedure Laterality Date  . Cholecystectomy  1980  . Appendectomy  1983  . Total knee arthroplasty  2003    left  . Permanent pacemaker insertion  01/01/2005    Medtronic EnRhythm  . Permanent pacemaker generator change  06/08/2011   Medtronic Adapta  . Tee without cardioversion  01/06/2005    no cardiac source of embolus   History reviewed. No pertinent family history. History  Substance Use Topics  . Smoking status: Current Every Day Smoker -- 0.50 packs/day    Types: Cigarettes  . Smokeless tobacco: Not on file  . Alcohol Use: No   Lives at home Lives alone  OB History    No data available     Review of Systems  All other systems reviewed and are negative.     Allergies  Floxin; Plavix; and Epinephrine  Home Medications   Prior to Admission medications   Medication Sig Start Date End Date Taking? Authorizing Provider  aspirin 81 MG chewable tablet Chew 81 mg by mouth daily.    Historical Provider, MD  diazepam (VALIUM) 5 MG tablet Take 5 mg by mouth at bedtime as needed for anxiety.     Historical Provider, MD  ergocalciferol (VITAMIN D2) 50000 UNITS capsule Take 50,000 Units by mouth once a week. On fridays    Historical Provider, MD  folic acid (FOLVITE) 800 MCG tablet Take 800 mcg by mouth daily.     Historical Provider, MD  HYDROcodone-acetaminophen (NORCO) 5-325 MG per tablet Take 1 tablet by mouth every 6 (six) hours as needed for severe pain.  04/08/11   Historical Provider, MD  lisinopril (PRINIVIL,ZESTRIL) 10 MG tablet Take 10 mg by mouth daily. 06/19/14   Historical Provider, MD  lithium carbonate (ESKALITH) 450 MG CR tablet Take 450 mg by mouth daily. 06/10/14   Historical Provider, MD  lithium carbonate 300 MG capsule Take 300 mg by mouth 2 (two) times daily with a meal.  05/17/11   Historical Provider, MD  meclizine (ANTIVERT) 50 MG tablet Take 0.5 tablets (25 mg total) by mouth 3 (three) times daily as needed for dizziness. 07/20/12   Olivia Mackielga M Otter, MD   BP 137/66 mmHg  Pulse 82  Temp(Src) 97.9 F (36.6 C) (Oral)  Resp 16  SpO2 100%  Vital signs normal   Physical Exam  Constitutional: She appears well-developed and well-nourished.  Non-toxic appearance. She does not appear ill. No  distress.  HENT:  Head: Normocephalic and atraumatic.  Right Ear: External ear normal.  Left Ear: External ear normal.  Nose: Nose normal. No mucosal edema or rhinorrhea.  Mouth/Throat: Oropharynx is clear and moist and mucous membranes are normal. No dental abscesses or uvula swelling.  Eyes: Conjunctivae and EOM are normal. Pupils are equal, round, and reactive to light.  Neck: Normal range of motion and full passive range of motion without pain. Neck supple.  Cardiovascular: Normal rate, regular rhythm and normal heart sounds.  Exam reveals no gallop and no friction rub.   No murmur heard. Pulmonary/Chest: Effort normal and breath sounds normal. No respiratory distress. She has no wheezes. She has no rhonchi. She has no rales. She exhibits no tenderness and no crepitus.  Abdominal: Soft. Normal appearance and bowel sounds are normal. She exhibits no distension. There is no tenderness. There is no rebound and no guarding.  Musculoskeletal: Normal range of motion. She exhibits no edema or tenderness.  Moves all extremities well.   Neurological: She is alert. She has normal strength. No cranial nerve deficit.  Skin: Skin is warm, dry and intact. No rash noted. No erythema. No pallor.  Psychiatric: Her mood appears anxious. Her speech is rapid and/or pressured. She is agitated.  Patient keeps trying to get up out of her stretcher  Nursing note and vitals reviewed.   ED Course  Procedures (including critical care time)  Medications  LORazepam (ATIVAN) tablet 1 mg (1 mg Oral Given 07/19/14 0420)  acetaminophen (TYLENOL) tablet 650 mg (not administered)  ibuprofen (ADVIL,MOTRIN) tablet 600 mg (not administered)  zolpidem (AMBIEN) tablet 10 mg (10 mg Oral Given 07/19/14 0420)  nicotine (NICODERM CQ - dosed in mg/24 hours) patch 21 mg (not administered)  ondansetron (ZOFRAN) tablet 4 mg (not administered)  alum & mag hydroxide-simeth (MAALOX/MYLANTA) 200-200-20 MG/5ML suspension 30 mL (not  administered)  aspirin chewable tablet 81 mg (not administered)  diazepam (VALIUM) tablet 5 mg (not administered)  lisinopril (PRINIVIL,ZESTRIL) tablet 10 mg (not administered)  lithium carbonate (ESKALITH) CR tablet 450 mg (not administered)  lithium carbonate capsule 300 mg (not administered)  ergocalciferol (VITAMIN D2) capsule 50,000 Units (50,000 Units Oral Not Given 07/19/14 0434)  folic acid (FOLVITE) tablet 800 mcg (not administered)      03:07 [place], TSS has seen patient and states she needs to see psychiatrist in am  Labs Review Results for orders placed or performed during the hospital encounter of 07/19/14  Acetaminophen level  Result Value Ref Range   Acetaminophen (Tylenol), Serum <15.0 10 - 30 ug/mL  CBC  Result Value Ref Range   WBC 11.3 (H) 4.0 - 10.5 K/uL   RBC 4.18 3.87 - 5.11 MIL/uL   Hemoglobin 11.9 (L) 12.0 - 15.0 g/dL   HCT 16.137.6 09.636.0 - 04.546.0 %  MCV 90.0 78.0 - 100.0 fL   MCH 28.5 26.0 - 34.0 pg   MCHC 31.6 30.0 - 36.0 g/dL   RDW 16.1 09.6 - 04.5 %   Platelets 203 150 - 400 K/uL  Comprehensive metabolic panel  Result Value Ref Range   Sodium 138 137 - 147 mEq/L   Potassium 4.4 3.7 - 5.3 mEq/L   Chloride 102 96 - 112 mEq/L   CO2 19 19 - 32 mEq/L   Glucose, Bld 97 70 - 99 mg/dL   BUN 20 6 - 23 mg/dL   Creatinine, Ser 4.09 (H) 0.50 - 1.10 mg/dL   Calcium 81.1 8.4 - 91.4 mg/dL   Total Protein 6.6 6.0 - 8.3 g/dL   Albumin 3.9 3.5 - 5.2 g/dL   AST 31 0 - 37 U/L   ALT 16 0 - 35 U/L   Alkaline Phosphatase 61 39 - 117 U/L   Total Bilirubin 0.5 0.3 - 1.2 mg/dL   GFR calc non Af Amer 39 (L) >90 mL/min   GFR calc Af Amer 45 (L) >90 mL/min   Anion gap 17 (H) 5 - 15  Ethanol (ETOH)  Result Value Ref Range   Alcohol, Ethyl (B) <11 0 - 11 mg/dL  Salicylate level  Result Value Ref Range   Salicylate Lvl <2.0 (L) 2.8 - 20.0 mg/dL  Lithium level  Result Value Ref Range   Lithium Lvl 0.43 (L) 0.80 - 1.40 mEq/L   Laboratory interpretation all normal  except subtherapeutic lithium, renal insufficiency, leukocytosis     Imaging Review No results found.   EKG Interpretation None      MDM   Final diagnoses:  Psychosis, unspecified psychosis type    Disposition pending   Devoria Albe, MD, Armando Gang     Ward Givens, MD 07/19/14 [phone]

## 2014-07-19 NOTE — Progress Notes (Signed)
Ms. Kellie Velez's sister in law signed a release of information form. It's in the chart.

## 2014-07-19 NOTE — BH Assessment (Signed)
Assessment completed. Consulted Verne SpurrNeil Mashburn, PA-C who recommended that pt be evaluated in the morning. Dr. Lynelle DoctorKnapp has been informed of the recommendation.
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
## 2014-07-19 NOTE — ED Notes (Signed)
Pt. Up wandering around unit speaking loudly. Pt. Anxious, requesting "something to help me rest".

## 2014-07-19 NOTE — ED Notes (Signed)
Pt. To SAPPU from ED ambulatory without difficulty, to room  . [facility]. Pt. Is alert, warm and dry in no distress. Pt. Denies SI, HI, and AVH. Pt. Calm and cooperative. Pt. Encouraged to let Nursing staff know of any concerns or needs.
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
## 2014-07-19 NOTE — BH Assessment (Signed)
Tele Assessment Note   Kellie SellsMary E Velez is an 58 y.o. female [facility] after being petitioned by her sister in law. Pt is a poor historian. The petition stated that pt is diagnosed with schizophrenia and [facility]. It has also been reported that pt stopped taking her lithium, talking to herself when no one is around and yelling at the people at the door. It has also been documented that pt has not been taking care of herself.  Pt is alert and oriented to her name. Pt maintained fair eye contact and her speech was soft and incoherent at time. Pt thought process is tangential and irrelevant at times. When pt was asked about previous suicide attempts pt held one finger up and when this writer asked pt for clarification pt stated "I knew it had to be something". When pt was asked about her appetite pt stated "I have been funny smell". Pt appears to be responding to internal stimuli and will need to be re-evaluated in the morning.    Axis I: See current hospital problem list  Past Medical History:  Past Medical History  Diagnosis Date  . Arthritis   . Hypertension   . Nerve damage   . Osteoporosis   . Hearing loss   . Abdominal pain   . Stroke     TIA's  . Bradycardia, sinus, persistent, severe   . 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 Bipolar 1 disorder   . Heart murmur 08/13/2012    EF 60-65%,severe LVH,Mild AOV regurg, MR,trivial TR  . Mild carotid artery disease 05/05/2005,08/13/2012    left ICA 0-49% diameter reduction 2006; bilateral fibrous plaque mild 2013  . LVH (left ventricular hypertrophy)     Past Surgical History  Procedure Laterality Date  . Cholecystectomy  1980  . Appendectomy  1983  . Total knee arthroplasty  2003    left  . Permanent pacemaker insertion  01/01/2005    Medtronic EnRhythm  . Permanent pacemaker generator change  06/08/2011    Medtronic Adapta  .  Tee without cardioversion  01/06/2005    no cardiac source of embolus    Family History: History reviewed. No pertinent family history.  Social History:  reports that she has been smoking Cigarettes.  She has been smoking about 0.50 packs per day. She does not have any smokeless tobacco history on file. She reports that she does not drink alcohol or use illicit drugs.  Additional Social History:  Alcohol / Drug Use History of alcohol / drug use?:  (Unable to assess)  CIWA: CIWA-Ar BP: 137/66 mmHg Pulse Rate: 82 COWS:    PATIENT STRENGTHS: (choose at least two) Average or above average intelligence Capable of independent living  Allergies:  Allergies  Allergen Reactions  . Floxin [Ofloxacin] Nausea And Vomiting and Rash  . Plavix [Clopidogrel Bisulfate]     "stomach pain"  . Epinephrine Other (See Comments)    "makes her feel excited"    Home Medications:  (Not in a hospital admission)  OB/GYN Status:  No LMP recorded. Patient is postmenopausal.  General Assessment Data Location of Assessment: [facility] ED Is this a Tele or Face-to-Face Assessment?: Face-to-Face Is this an Initial Assessment or a Re-assessment for this encounter?: Initial Assessment Living Arrangements: Alone Can pt return to current living arrangement?: Yes Admission Status: Involuntary Is patient capable of signing voluntary admission?: Yes Transfer from: Home Referral  Source: Self/Family/Friend     [facility] Living Arrangements: Alone Name of Psychiatrist: Unable to assess Name of Therapist: Unable to assess   Education Status Is patient currently in school?: No  Risk to self with the past 6 months Suicidal Ideation:  (unable to assess ) Suicidal Intent:  (unable to assess) Is patient at risk for suicide?:  (unable to assess) Suicidal Plan?:  (unable to assess) Access to Means:  (unable to assess) What has been your use of drugs/alcohol within the last 12 months?:  (unable to  assess) Previous Attempts/Gestures:  (unable to assess) How many times?:  (unable to assess ) Other Self Harm Risks:  (unable) Triggers for Past Attempts:  (unable to assess) Intentional Self Injurious Behavior:  (unable to assess) Family Suicide History: Unable to assess Recent stressful life event(s):  (unable to assess) Persecutory voices/beliefs?: Yes Depression:  (unable to assess) Substance abuse history and/or treatment for substance abuse?:  (unable to assess) Suicide prevention information given to non-admitted patients:  (unable to assess)  Risk to Others within the past 6 months Homicidal Ideation:  (unable to assess) Thoughts of Harm to Others:  (unable to assess) Current Homicidal Intent:  (unable to assesss) Current Homicidal Plan:  (unable to assess ) Access to Homicidal Means:  (unable to assess) Identified Victim:  (unable to assess) History of harm to others?:  (unable to assess) Assessment of Violence:  (unable to assess) Violent Behavior Description:  (unable to assess) Does patient have access to weapons?:  (unable to assess) Criminal Charges Pending?:  (unable to assess) Does patient have a court date:  (unable to assess)  Psychosis Hallucinations: Auditory Delusions: None noted  Mental Status Report Appear/Hygiene: Unremarkable Eye Contact: Fair Motor Activity: Freedom of movement Speech: Soft Level of Consciousness: Quiet/awake Mood: Pleasant Affect: Appropriate to circumstance Anxiety Level: None Thought Processes: Circumstantial Judgement: Impaired Orientation: Person Obsessive Compulsive Thoughts/Behaviors: None  Cognitive Functioning Concentration: Unable to Assess Memory: Unable to Assess IQ: Average Insight: Unable to Assess Impulse Control: Unable to Assess Appetite: Good Weight Loss: 0 Weight Gain: 0 Sleep: Unable to Assess Vegetative Symptoms: Unable to Assess  ADLScreening [facility]) Patient's cognitive ability  adequate to safely complete daily activities?: Yes Patient able to express need for assistance with ADLs?: Yes Independently performs ADLs?: Yes (appropriate for developmental age)  Prior Inpatient Therapy Prior Inpatient Therapy:  (unable to assess)  Prior Outpatient Therapy Prior Outpatient Therapy:  (unable to assess)  ADL Screening (condition at time of admission) Patient's cognitive ability adequate to safely complete daily activities?: Yes Patient able to express need for assistance with ADLs?: Yes Independently performs ADLs?: Yes (appropriate for developmental age)       Abuse/Neglect Assessment (Assessment to be complete while patient is alone) Physical Abuse:  (unable to assess) Verbal Abuse:  (unable to assess ) Sexual Abuse:  (unable to assess ) Exploitation of patient/patient's resources:  (unable to assess) Self-Neglect:  (unable to assess)     Advance Directives (For Healthcare) Does patient have an advance directive?: No Would patient like information on creating an advanced directive?: No - patient declined information    Additional Information 1:1 In Past 12 Months?: No CIRT Risk: No Elopement Risk: Yes     Disposition:  Disposition Initial Assessment Completed for this Encounter: Yes Disposition of Patient: Other dispositions Other disposition(s): Other (Comment) (Psychiatric evaluation in the am )  Fermin Yan S 07/19/2014 3:56 AM
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
## 2014-07-19 NOTE — Progress Notes (Signed)
Staff reports Pt is not independent with ADLs -- she had to be instructed/aided through every step of showering and changing.

## 2014-07-20 ENCOUNTER — Other Ambulatory Visit: Payer: Self-pay

## 2014-07-20 DIAGNOSIS — F3130 Bipolar disorder, current episode depressed, mild or moderate severity, unspecified: Secondary | ICD-10-CM

## 2014-07-20 DIAGNOSIS — F29 Unspecified psychosis not due to a substance or known physiological condition: Secondary | ICD-10-CM

## 2014-07-20 MED ORDER — LORAZEPAM 2 MG/ML IJ SOLN
1.0000 mg | Freq: Four times a day (QID) | INTRAMUSCULAR | Status: DC | PRN
  Administered 2014-07-20 – 2014-07-21 (×2): 1 mg via INTRAMUSCULAR
  Filled 2014-07-20 (×2): qty 1

## 2014-07-20 MED ORDER — QUETIAPINE FUMARATE 100 MG PO TABS
100.0000 mg | ORAL_TABLET | Freq: Three times a day (TID) | ORAL | Status: DC
  Administered 2014-07-20 – 2014-07-21 (×3): 100 mg via ORAL
  Filled 2014-07-20 (×3): qty 1

## 2014-07-20 MED ORDER — ZIPRASIDONE MESYLATE 20 MG IM SOLR: 20.0000 mg | Freq: Once | INTRAMUSCULAR | Status: DC

## 2014-07-20 MED ORDER — ZIPRASIDONE MESYLATE 20 MG IM SOLR
20.0000 mg | Freq: Once | INTRAMUSCULAR | Status: AC
  Administered 2014-07-20: 20 mg via INTRAMUSCULAR
  Filled 2014-07-20: qty 20

## 2014-07-20 MED ORDER — LORAZEPAM 1 MG PO TABS: 1.0000 mg | ORAL_TABLET | Freq: Once | ORAL | Status: DC | PRN

## 2014-07-20 MED ORDER — LORAZEPAM 1 MG PO TABS
1.0000 mg | ORAL_TABLET | Freq: Four times a day (QID) | ORAL | Status: DC | PRN
  Filled 2014-07-20 (×2): qty 1

## 2014-07-20 NOTE — ED Notes (Signed)
Pt became mildly agitated, getting out of bed and walking out of her room when she awakened. She was redirected and assisted back to her room. She was given supplies for peri care of which she performed with minimum assist. Pt was given change of clothes and depend.  During care, pt request her family to come back to see her. Pt alert and oriented to self and place.
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
## 2014-07-20 NOTE — ED Notes (Addendum)
Patient awake, talking to "Molly Maduroobert"  and "Mr. Obama"

## 2014-07-20 NOTE — Consult Note (Signed)
[name]  Subjective: Pt seen and  continues to be psychotic and responding to internal stimuli. Pt is a poor historian and subjective data is likely inaccurate this time. Pt meets inpatient admission criteria. Continue seeking placement.   HPI: Kellie Velez is an 58 y.o. female presenting to [facility] ED after being petitioned by her sister in law. Pt is a poor historian. The petition stated that pt is diagnosed with schizophrenia and has been committed to [facility] in the past. It has also been reported that pt stopped taking her lithium, talking to herself when no one is around and yelling at the people at the door. It has also been documented that pt has not been taking care of herself.  Pt is alert and oriented to her name. Pt maintained fair eye contact and her speech was soft and incoherent her thought processes disorganized and she is tearful. She still obsessed about Elvis and claims that she has met him. She obviously is psychotic and will need further treatment    Axis I: Bipolar, mixed and Psychotic Disorder NOS Axis II: Deferred Axis III:  Past Medical History  Diagnosis Date  . Arthritis   . Hypertension   . Nerve damage   . Osteoporosis   . Hearing loss   . Abdominal pain   . Stroke     TIA's  . Bradycardia, sinus, persistent, severe   . 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 Bipolar 1 disorder   . Heart murmur 08/13/2012    EF 60-65%,severe LVH,Mild AOV regurg, MR,trivial TR  . Mild carotid artery disease 05/05/2005,08/13/2012    left ICA 0-49% diameter reduction 2006; bilateral fibrous plaque mild 2013  . LVH (left ventricular hypertrophy)    Axis IV: other psychosocial or environmental problems and problems related to social environment Axis V: 31-40 impairment in reality testing  Psychiatric Specialty Exam: Physical Exam  ROS  Blood pressure 114/66, pulse 72, temperature 98.3  F (36.8 C), temperature source Oral, resp. rate 20, SpO2 100 %.There is no weight on file to calculate BMI.  General Appearance: Bizarre  Eye Contact::  Minimal  Speech:  Blocked and Slow  Volume:  Decreased  Mood:  Irritable tearful   Affect:  Non-Congruent, Inappropriate and Labile  Thought Process:  Irrelevant, Loose and Tangential  Orientation:  Other:  Self  Thought Content:  Rumination  Suicidal Thoughts:  No  Homicidal Thoughts:  No  Memory:  Immediate;   Poor Recent;   Poor Remote;   Poor  Judgement:  Impaired  Insight:  Lacking  Psychomotor Activity:  Decreased  Concentration:  Poor  Recall:  Poor  Akathisia:  No  Handed:    AIMS (if indicated):     Assets:  Resilience  Sleep:         Past Medical History:  Past Medical History  Diagnosis Date  . Arthritis   . Hypertension   . Nerve damage   . Osteoporosis   . Hearing loss   . Abdominal pain   . Stroke     TIA's  . Bradycardia, sinus, persistent, severe   . 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 Bipolar 1 disorder   . Heart murmur 08/13/2012    EF 60-65%,severe LVH,Mild AOV regurg, MR,trivial TR  . Mild carotid artery disease 05/05/2005,08/13/2012    left ICA 0-49% diameter reduction 2006; bilateral fibrous plaque mild  2013  . LVH (left ventricular hypertrophy)     Past Surgical History  Procedure Laterality Date  . Cholecystectomy  1980  . Appendectomy  1983  . Total knee arthroplasty  2003    left  . Permanent pacemaker insertion  01/01/2005    Medtronic EnRhythm  . Permanent pacemaker generator change  06/08/2011    Medtronic Adapta  . Tee without cardioversion  01/06/2005    no cardiac source of embolus    Family History: History reviewed. No pertinent family history.  Social History:  reports that she has been smoking Cigarettes.  She has been smoking about 0.50 packs per day. She does not have any smokeless tobacco history on  file. She reports that she does not drink alcohol or use illicit drugs.  Additional Social History:  Alcohol / Drug Use History of alcohol / drug use?:  (Unable to assess)  CIWA: CIWA-Ar BP: 114/66 mmHg Pulse Rate: 72 COWS:    PATIENT STRENGTHS: (choose at least two) Average or above average intelligence Capable of independent living  Allergies:  Allergies  Allergen Reactions  . Floxin [Ofloxacin] Nausea And Vomiting and Rash  . Plavix [Clopidogrel Bisulfate]     "stomach pain"  . Epinephrine Other (See Comments)    "makes her feel excited"    Home Medications:  (Not in a hospital admission)    Plan: Seek inpatient psychiatric hospitalization. We'll continue lithium and Seroquel and check a lithium level   Levonne Spiller MD

## 2014-07-20 NOTE — Progress Notes (Signed)
CS[facility] and confirmed receipt of referral fax.  Obtained authorization number and gave demographic information to staff [facility].  Will [facility].  Kellie Amasdith Jenisis Harmsen, LCSW [phone]

## 2014-07-20 NOTE — ED Notes (Signed)
Pt received injection. Was escorted back to her room and bed with assistance x 3. Pt was hitting and kicked staff. Security was called for reinforcement.

## 2014-07-20 NOTE — ED Notes (Signed)
Pt continues to refuse medications because she states that they are not compatible with her lithium.  She continues to walk and pace in room. Pt is agitated and continually asks to go outside to smoke; nicotine patch offered but patient refuses because of her perceived interaction with her lithium.

## 2014-07-20 NOTE — ED Notes (Signed)
Patient is resting comfortably; sitter at bedside °

## 2014-07-20 NOTE — ED Notes (Signed)
Patient is resting comfortably. Asleep in comforter

## 2014-07-20 NOTE — Consult Note (Signed)
[facility]  Subjective: Pt seen and chart reviewed, evaluated by Dr. Tenny Crawoss. Per Dr. Tenny Crawoss, pt continues to be psychotic and responding to internal stimuli. Pt is a poor historian and subjective data is likely inaccurate this time. Pt meets inpatient admission criteria. Continue seeking placement.   HPI: Kellie Velez is an 58 y.o. female [facility] after being petitioned by her sister in law. Pt is a poor historian. The petition stated that pt is diagnosed with schizophrenia and [facility]. It has also been reported that pt stopped taking her lithium, talking to herself when no one is around and yelling at the people at the door. It has also been documented that pt has not been taking care of herself.  Pt is alert and oriented to her name. Pt maintained fair eye contact and her speech was soft and incoherent at time. Pt thought process is tangential and irrelevant at times. When pt was asked about previous suicide attempts pt held one finger up and when this writer asked pt for clarification pt stated "I knew it had to be something". When pt was asked about her appetite pt stated "I have been funny smell". Pt appears to be responding to internal stimuli and will need to be re-evaluated in the morning.    Axis I: Bipolar, mixed and Psychotic Disorder NOS Axis II: Deferred Axis III:  Past Medical History  Diagnosis Date  . Arthritis   . Hypertension   . Nerve damage   . Osteoporosis   . Hearing loss   . Abdominal pain   . Stroke     TIA's  . Bradycardia, sinus, persistent, severe   . 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 Bipolar 1 disorder   . Heart murmur 08/13/2012    EF 60-65%,severe LVH,Mild AOV regurg, MR,trivial TR  . Mild carotid artery disease 05/05/2005,08/13/2012    left ICA 0-49% diameter reduction 2006; bilateral fibrous plaque mild 2013  . LVH (left  ventricular hypertrophy)    Axis IV: other psychosocial or environmental problems and problems related to social environment Axis V: 31-40 impairment in reality testing  Psychiatric Specialty Exam: Physical Exam  ROS  Blood pressure 114/66, pulse 72, temperature 98.3 F (36.8 C), temperature source Oral, resp. rate 20, SpO2 100 %.There is no weight on file to calculate BMI.  General Appearance: Bizarre  Eye Contact::  Minimal  Speech:  Blocked and Slow  Volume:  Decreased  Mood:  Irritable  Affect:  Non-Congruent, Inappropriate and Labile  Thought Process:  Irrelevant, Loose and Tangential  Orientation:  Other:  Self  Thought Content:  Rumination  Suicidal Thoughts:  No  Homicidal Thoughts:  No  Memory:  Immediate;   Poor Recent;   Poor Remote;   Poor  Judgement:  Impaired  Insight:  Lacking  Psychomotor Activity:  Decreased  Concentration:  Poor  Recall:  Poor  Akathisia:  No  Handed:    AIMS (if indicated):     Assets:  Resilience  Sleep:         Past Medical History:  Past Medical History  Diagnosis Date  . Arthritis   . Hypertension   . Nerve damage   . Osteoporosis   . Hearing loss   . Abdominal pain   . Stroke     TIA's  . Bradycardia, sinus, persistent, severe   . Presence of permanent cardiac  pacemaker 06/08/2011    medtronic adapta;original implant 01/01/2005  . Bipolar 1 disorder   . Heart murmur 08/13/2012    EF 60-65%,severe LVH,Mild AOV regurg, MR,trivial TR  . Mild carotid artery disease 05/05/2005,08/13/2012    left ICA 0-49% diameter reduction 2006; bilateral fibrous plaque mild 2013  . LVH (left ventricular hypertrophy)     Past Surgical History  Procedure Laterality Date  . Cholecystectomy  1980  . Appendectomy  1983  . Total knee arthroplasty  2003    left  . Permanent pacemaker insertion  01/01/2005    Medtronic EnRhythm  . Permanent pacemaker generator change  06/08/2011    Medtronic Adapta  . Tee without cardioversion  01/06/2005     no cardiac source of embolus    Family History: History reviewed. No pertinent family history.  Social History:  reports that she has been smoking Cigarettes.  She has been smoking about 0.50 packs per day. She does not have any smokeless tobacco history on file. She reports that she does not drink alcohol or use illicit drugs.  Additional Social History:  Alcohol / Drug Use History of alcohol / drug use?:  (Unable to assess)  CIWA: CIWA-Ar BP: 114/66 mmHg Pulse Rate: 72 COWS:    PATIENT STRENGTHS: (choose at least two) Average or above average intelligence Capable of independent living  Allergies:  Allergies  Allergen Reactions  . Floxin [Ofloxacin] Nausea And Vomiting and Rash  . Plavix [Clopidogrel Bisulfate]     "stomach pain"  . Epinephrine Other (See Comments)    "makes her feel excited"    Home Medications:  (Not in a hospital admission)  OB/GYN Status:  No LMP recorded. Patient is postmenopausal.  Plan: Seek inpatient psychiatric hospitalization.   Beau FannyWithrow, John C, FNP-BC 07/19/2014 4:05 PM Patient seen and I agree with treatment and plan Diannia Rudereborah Ross

## 2014-07-20 NOTE — ED Notes (Signed)
Patient ate 50% of her breakfast

## 2014-07-20 NOTE — ED Notes (Signed)
Attempted to administer medications to patient. She acted as if she was going to take them and dropped them on the bed. Then she placed a blanket over her head after being asked to remove her blanket or have blankets be removed from room. Unable to gain full set of vital signs. Her BP was obtained in left leg since pt. refused to allow this nurse access to her arm. Pt got upset and walked to the bathroom. Gait is steady.

## 2014-07-20 NOTE — ED Notes (Signed)
Patient being aggressive attempting to leave yelling, pushing and cursing at staff. Attempted to redirect patient notified security. [employer] in roo talking with patient.

## 2014-07-20 NOTE — ED Provider Notes (Signed)
Date: 07/20/2014  Rate: 76  Rhythm: normal sinus rhythm  QRS Axis: normal  Intervals: normal  ST/T Wave abnormalities: normal  Conduction Disutrbances:none  Narrative Interpretation:   Old EKG Reviewed: none available    Gilda Creasehristopher J. Jariana Shumard, MD 07/20/14 [phone]

## 2014-07-20 NOTE — ED Notes (Signed)
50 mg Seroquel refused by patient/wasted

## 2014-07-20 NOTE — ED Notes (Signed)
Pt is being very aggressive all day at staff. Attempting to leave  And yelling pushing staff. Pt sister-law call x2 today.

## 2014-07-20 NOTE — BH Assessment (Signed)
Kellie Velez, [facility], confirms adult unit is at capacity. Contacted the following facilities for placement:  BED AVAILABLE, PT IS UNDER REVIEW: Kellie Velez, per Kellie Velez, per Kellie Velez, per Kellie Velez  AT CAPACITY: Kellie Velez, per [facility], per [facility], per [facility], per [facility], per Kellie StanleyLisa  NO RESPONSE: Kellie [facility]elez [facility] Velez    Kellie Sunbeam RoadFord [name] Patsy BaltimoreWarrick Jr, WisconsinLPC, [place] [facility]z [place] Triage Specialist [phone]

## 2014-07-20 NOTE — ED Notes (Signed)
She has been agitated and came out of her room announcing "I need a cigarette".  Security notified, and they escort her back into her room.  Although I offer her her nicotine patch, she steadfastly continues to refuse it.  She states "I won't do or take anything until I get a cigarette."

## 2014-07-20 NOTE — ED Notes (Signed)
Pt began to walk toward the door asking if she can go outside to smoke a cigarrete. Arttempted to redirect pt. Multiple times to her room. She began to walk around stopping and looking into other patients rooms wanting to enter them and wake them up. Once pt. Was redirected to her room, pt picked up a cup of water and threw it down forceful causing the cup to break and water spill everywhere. Pt began to scream and yell.

## 2014-07-20 NOTE — BH Assessment (Addendum)
[facility] Assessment Progress Note   The following facilities were contacted in an attempt to place the pt:  Referral fax[facility] per Melanie Davis-beds per Michae KavaJoan Marie  [facility]   No answer/left message OV at [address] [place] at [address] [place] at 1030  [facility] per Kayla  Pt is pending [facility] as well.  TTS will continue to seek placement for the pt.  Casimer LaniusKristen Kirsten Spearing, MS, [facility] Licensed Professional Counselor Therapeutic Triage Specialist [facility] Phone: [phone] Fax: [phone]

## 2014-07-20 NOTE — ED Notes (Signed)
Pt not responding to staff directions to stay in room; security called; pt placed hands on [employer] staff and had to be placed in bed. Pt continually being aggressive and uncooperative. Periodically shouting and talking about random things that don't make sense. Pt also goes into other pt rooms to talk and has to be escorted back to her room.  

## 2014-07-20 NOTE — ED Notes (Signed)
As I write this, Dr. Tenny Crawoss and her nurse practitioner are seeing her.  For the past hour or so she has been very paranoid in her thought patterns; and is refusing all medications.  She asked for pain medicine for generalized aches, and I offered her Ibuprofen, which she refused, stating "that doesn't mix with lithium".

## 2014-07-21 DIAGNOSIS — F250 Schizoaffective disorder, bipolar type: Secondary | ICD-10-CM

## 2014-07-21 MED ORDER — QUETIAPINE FUMARATE 200 MG PO TABS: 200.0000 mg | ORAL_TABLET | Freq: Three times a day (TID) | ORAL | Status: DC

## 2014-07-21 MED ORDER — LISINOPRIL 10 MG PO TABS: 10.0000 mg | ORAL_TABLET | Freq: Every day | ORAL | Status: DC

## 2014-07-21 MED ORDER — QUETIAPINE FUMARATE 100 MG PO TABS
200.0000 mg | ORAL_TABLET | Freq: Three times a day (TID) | ORAL | Status: DC
  Filled 2014-07-21: qty 2

## 2014-07-21 MED ORDER — DIAZEPAM 5 MG PO TABS: 5.0000 mg | ORAL_TABLET | Freq: Every evening | ORAL | Status: DC | PRN

## 2014-07-21 MED ORDER — LITHIUM CARBONATE 300 MG PO CAPS: 300.0000 mg | ORAL_CAPSULE | Freq: Two times a day (BID) | ORAL | Status: DC

## 2014-07-21 MED ORDER — ASPIRIN 81 MG PO CHEW: 81.0000 mg | CHEWABLE_TABLET | Freq: Every day | ORAL | Status: DC

## 2014-07-21 NOTE — Progress Notes (Addendum)
Per Junious Dresseronnie [facility].  CSW sent clinicals for priority consideration.  Byrd HesselbachKristen Heiley Shaikh, LCSW [phone]  ED CSW 07/21/2014 8:32 AM

## 2014-07-21 NOTE — Progress Notes (Signed)
Pt sister came to visit patient. Pt gave verbal permission to speak with pt sister. However pt is still very delusional and guarded. Pt not appropriate to sign consent at this time. Pt sister shared that patient had a psychotic break at 6416, 3634, and then had a small episode a couple of years ago and then patient dr. Has been changing patient lithium levels. Patient states she will only take it at night. CSW spoke with NP regarding information. Pt still appearing to be responding to internal stimuli and states she has cancer which is not accurate. Pt becoming angry when talking about medications and being forced medications.   Byrd HesselbachKristen Monya Kozakiewicz, LCSW [phone]  ED CSW 07/21/2014 1206pm 12:09 PM

## 2014-07-21 NOTE — ED Notes (Signed)
Patient refused 300 mg Lithium and 10 mg lisinopril.

## 2014-07-21 NOTE — Consult Note (Signed)
[facility] Face-to-Face Psychiatry Consult   Reason for Consult:  Psychosis  Referring Physician:  EDP  Kellie Velez is an 58 y.o. female. Total Time spent with patient: 30 minutes  Assessment: AXIS I:  Bipolar, mixed  Versus schizophrenia acute phase AXIS II:  Deferred AXIS III:   Past Medical History  Diagnosis Date  . Arthritis   . Hypertension   . Nerve damage   . Osteoporosis   . Hearing loss   . Abdominal pain   . Stroke     TIA's  . Bradycardia, sinus, persistent, severe   . 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 Bipolar 1 disorder   . Heart murmur 08/13/2012    EF 60-65%,severe LVH,Mild AOV regurg, MR,trivial TR  . Mild carotid artery disease 05/05/2005,08/13/2012    left ICA 0-49% diameter reduction 2006; bilateral fibrous plaque mild 2013  . LVH (left ventricular hypertrophy)    AXIS IV:  other psychosocial or environmental problems and problems related to social environment AXIS V:  21-30 behavior considerably influenced by delusions or hallucinations OR serious impairment in judgment, communication OR inability to function in almost all areas  Plan:  Recommend psychiatric Inpatient admission when medically cleared.  Subjective:   Kellie Velez is a 58 y.o. female patient admitted with acute psychosis and paranoia.   HPI: Kellie Velez is an 58 y.o. female presenting to [facility] ED after being petitioned by her sister in law. Pt is a poor historian. The petition stated that pt is diagnosed with schizophrenia and has been committed to [facility] in the past. It has also been reported that pt stopped taking her lithium, talking to herself when no one is around and yelling at the people at the door. It has also been documented that pt has not been taking care of herself.  Pt is alert and oriented to her name and place. Patient presents as quite paranoid and is disorganized in conversation.  Patient has significant  paranoid ideation and states "we need to fix this"  Patient denies current suicidal ideation but reports feeling sad and depressed.  Relates that her husband passed away from cancer last year.  Patient denies auditory or visual hallucinations but appears to be attending to internal stimuli at times and is delusional verbalizes concern that "we are from [place]".   Axis I: Bipolar 1 per history with mixed presentation with psychotic features  Versus schizoaffective disorder  Axis II: Deferred Axis III:   HPI Elements:   Location:  generalized. Quality:  acute. Severity:  severe. Timing:  ongoing. Duration:  past few weeks. Context:  medication non compliance.  Past Psychiatric History: Past Medical History  Diagnosis Date  . Arthritis   . Hypertension   . Nerve damage   . Osteoporosis   . Hearing loss   . Abdominal pain   . Stroke     TIA's  . Bradycardia, sinus, persistent, severe   . 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 Bipolar 1 disorder   . Heart murmur 08/13/2012    EF 60-65%,severe LVH,Mild AOV regurg, MR,trivial TR  . Mild carotid artery disease 05/05/2005,08/13/2012    left ICA 0-49% diameter reduction 2006; bilateral fibrous plaque mild 2013  . LVH (left ventricular hypertrophy)     reports that she has been smoking Cigarettes.  She has been smoking about 0.50 packs per day. She does not have  any smokeless tobacco history on file. She reports that she does not drink alcohol or use illicit drugs. History reviewed. No pertinent family history. Family History Substance Abuse:  (unable to assess) Family Supports:  (unable to assess) Living Arrangements: Alone Can pt return to current living arrangement?: Yes Abuse/Neglect [facility]) Physical Abuse:  (unable to assess) Verbal Abuse:  (unable to assess ) Sexual Abuse:  (unable to assess ) Allergies:   Allergies  Allergen Reactions  . Floxin [Ofloxacin] Nausea  And Vomiting and Rash  . Plavix [Clopidogrel Bisulfate]     "stomach pain"  . Epinephrine Other (See Comments)    "makes her feel excited"    ACT Assessment Complete:  Yes:    Educational Status    Risk to Self: Risk to self with the past 6 months Suicidal Ideation:  (unable to assess ) Suicidal Intent:  (unable to assess) Is patient at risk for suicide?:  (unable to assess) Suicidal Plan?:  (unable to assess) Access to Means:  (unable to assess) What has been your use of drugs/alcohol within the last 12 months?:  (unable to assess) Previous Attempts/Gestures:  (unable to assess) How many times?:  (unable to assess ) Other Self Harm Risks:  (unable) Triggers for Past Attempts:  (unable to assess) Intentional Self Injurious Behavior:  (unable to assess) Family Suicide History: Unable to assess Recent stressful life event(s):  (unable to assess) Persecutory voices/beliefs?: Yes Depression:  (unable to assess) Substance abuse history and/or treatment for substance abuse?: No Suicide prevention information given to non-admitted patients:  (unable to assess)  Risk to Others: Risk to Others within the past 6 months Homicidal Ideation:  (unable to assess) Thoughts of Harm to Others:  (unable to assess) Current Homicidal Intent:  (unable to assesss) Current Homicidal Plan:  (unable to assess ) Access to Homicidal Means:  (unable to assess) Identified Victim:  (unable to assess) History of harm to others?:  (unable to assess) Assessment of Violence:  (unable to assess) Violent Behavior Description:  (unable to assess) Does patient have access to weapons?:  (unable to assess) Criminal Charges Pending?:  (unable to assess) Does patient have a court date:  (unable to assess)  Abuse: Abuse/Neglect Assessment (Assessment to be complete while patient is alone) Physical Abuse:  (unable to assess) Verbal Abuse:  (unable to assess ) Sexual Abuse:  (unable to assess ) Exploitation of  patient/patient's resources:  (unable to assess) Self-Neglect:  (unable to assess)  Prior Inpatient Therapy: Prior Inpatient Therapy Prior Inpatient Therapy:  (unable to assess)  Prior Outpatient Therapy: Prior Outpatient Therapy Prior Outpatient Therapy:  (unable to assess)  Additional Information: Additional Information 1:1 In Past 12 Months?: No CIRT Risk: No Elopement Risk: Yes                  Objective: Blood pressure 133/64, pulse 86, temperature 97.9 F (36.6 C), temperature source Oral, resp. rate 20, height _0  (1.676 m), weight 103.675 kg (228 lb 9 oz), SpO2 99 %.Body mass index is 36.91 kg/(m^2). Results for orders placed or performed during the hospital encounter of 07/19/14 (from the past 72 hour(s))  Acetaminophen level     Status: None   Collection Time: 07/19/14 12:23 AM  Result Value Ref Range   Acetaminophen (Tylenol), Serum <15.0 10 - 30 ug/mL    Comment:        THERAPEUTIC CONCENTRATIONS VARY SIGNIFICANTLY. A RANGE OF 10-30 ug/mL MAY BE AN EFFECTIVE CONCENTRATION FOR MANY PATIENTS. HOWEVER, SOME ARE BEST  TREATED AT CONCENTRATIONS OUTSIDE THIS RANGE. ACETAMINOPHEN CONCENTRATIONS >150 ug/mL AT 4 HOURS AFTER INGESTION AND >50 ug/mL AT 12 HOURS AFTER INGESTION ARE OFTEN ASSOCIATED WITH TOXIC REACTIONS.   CBC     Status: Abnormal   Collection Time: 07/19/14 12:23 AM  Result Value Ref Range   WBC 11.3 (H) 4.0 - 10.5 K/uL   RBC 4.18 3.87 - 5.11 MIL/uL   Hemoglobin 11.9 (L) 12.0 - 15.0 g/dL   HCT 37.6 36.0 - 46.0 %   MCV 90.0 78.0 - 100.0 fL   MCH 28.5 26.0 - 34.0 pg   MCHC 31.6 30.0 - 36.0 g/dL   RDW 15.1 11.5 - 15.5 %   Platelets 203 150 - 400 K/uL  Comprehensive metabolic panel     Status: Abnormal   Collection Time: 07/19/14 12:23 AM  Result Value Ref Range   Sodium 138 137 - 147 mEq/L   Potassium 4.4 3.7 - 5.3 mEq/L   Chloride 102 96 - 112 mEq/L   CO2 19 19 - 32 mEq/L   Glucose, Bld 97 70 - 99 mg/dL   BUN 20 6 - 23 mg/dL    Creatinine, Ser 1.42 (H) 0.50 - 1.10 mg/dL   Calcium 10.3 8.4 - 10.5 mg/dL   Total Protein 6.6 6.0 - 8.3 g/dL   Albumin 3.9 3.5 - 5.2 g/dL   AST 31 0 - 37 U/L   ALT 16 0 - 35 U/L   Alkaline Phosphatase 61 39 - 117 U/L   Total Bilirubin 0.5 0.3 - 1.2 mg/dL   GFR calc non Af Amer 39 (L) >90 mL/min   GFR calc Af Amer 45 (L) >90 mL/min    Comment: (NOTE) The eGFR has been calculated using the CKD EPI equation. This calculation has not been validated in all clinical situations. eGFR's persistently <90 mL/min signify possible Chronic Kidney Disease.    Anion gap 17 (H) 5 - 15  Ethanol (ETOH)     Status: None   Collection Time: 07/19/14 12:23 AM  Result Value Ref Range   Alcohol, Ethyl (B) <11 0 - 11 mg/dL    Comment:        LOWEST DETECTABLE LIMIT FOR SERUM ALCOHOL IS 11 mg/dL FOR MEDICAL PURPOSES ONLY   Salicylate level     Status: Abnormal   Collection Time: 07/19/14 12:23 AM  Result Value Ref Range   Salicylate Lvl <5.4 (L) 2.8 - 20.0 mg/dL  Lithium level     Status: Abnormal   Collection Time: 07/19/14 12:24 AM  Result Value Ref Range   Lithium Lvl 0.43 (L) 0.80 - 1.40 mEq/L  Urine Drug Screen     Status: None   Collection Time: 07/19/14  1:48 AM  Result Value Ref Range   Opiates NONE DETECTED NONE DETECTED   Cocaine NONE DETECTED NONE DETECTED   Benzodiazepines NONE DETECTED NONE DETECTED   Amphetamines NONE DETECTED NONE DETECTED   Tetrahydrocannabinol NONE DETECTED NONE DETECTED   Barbiturates NONE DETECTED NONE DETECTED    Comment:        DRUG SCREEN FOR MEDICAL PURPOSES ONLY.  IF CONFIRMATION IS NEEDED FOR ANY PURPOSE, NOTIFY LAB WITHIN 5 DAYS.        LOWEST DETECTABLE LIMITS FOR URINE DRUG SCREEN Drug Class       Cutoff (ng/mL) Amphetamine      1000 Barbiturate      200 Benzodiazepine   270 Tricyclics       623 Opiates  300 Cocaine          300 THC              50    Labs are reviewed and are pertinent for medical issues being treated.  .  Current Facility-Administered Medications  Medication Dose Route Frequency Provider Last Rate Last Dose  . acetaminophen (TYLENOL) tablet 650 mg  650 mg Oral Q4H PRN Janice Norrie, MD      . alum & mag hydroxide-simeth (MAALOX/MYLANTA) 200-200-20 MG/5ML suspension 30 mL  30 mL Oral PRN Janice Norrie, MD      . aspirin chewable tablet 81 mg  81 mg Oral Daily Janice Norrie, MD   81 mg at 07/21/14 1024  . diazepam (VALIUM) tablet 5 mg  5 mg Oral QHS PRN Janice Norrie, MD   5 mg at 07/20/14 2244  . ibuprofen (ADVIL,MOTRIN) tablet 600 mg  600 mg Oral Q8H PRN Janice Norrie, MD      . lisinopril (PRINIVIL,ZESTRIL) tablet 10 mg  10 mg Oral Daily Janice Norrie, MD   10 mg at 07/19/14 1006  . lithium carbonate capsule 300 mg  300 mg Oral BID WC Levonne Spiller, MD   300 mg at 07/20/14 1712  . LORazepam (ATIVAN) tablet 1 mg  1 mg Oral Q6H PRN Levonne Spiller, MD       Or  . LORazepam (ATIVAN) injection 1 mg  1 mg Intramuscular Q6H PRN Levonne Spiller, MD   1 mg at 07/21/14 0139  . nicotine (NICODERM CQ - dosed in mg/24 hours) patch 21 mg  21 mg Transdermal Daily Janice Norrie, MD   21 mg at 07/19/14 0951  . ondansetron (ZOFRAN) tablet 4 mg  4 mg Oral Q8H PRN Janice Norrie, MD      . QUEtiapine (SEROQUEL) tablet 200 mg  200 mg Oral TID Heman Que      . white petrolatum (VASELINE) gel   Topical PRN Levonne Spiller, MD       Current Outpatient Prescriptions  Medication Sig Dispense Refill  . aspirin 81 MG chewable tablet Chew 81 mg by mouth daily.    Marland Kitchen HYDROcodone-acetaminophen (NORCO) 5-325 MG per tablet Take 1-2 tablets by mouth every 4 (four) hours as needed for severe pain.     Marland Kitchen lisinopril (PRINIVIL,ZESTRIL) 10 MG tablet Take 10 mg by mouth daily.    Marland Kitchen lithium carbonate 150 MG capsule Take 150 mg by mouth every 8 (eight) hours.      Psychiatric Specialty Exam:     Blood pressure 133/64, pulse 86, temperature 97.9 F (36.6 C), temperature source Oral, resp. rate 20, height _0  (1.676 m), weight 103.675 kg (228  lb 9 oz), SpO2 99 %.Body mass index is 36.91 kg/(m^2).  General Appearance: Disheveled  Eye Contact::  Poor  Speech:  Clear, somewhat pressured disorganized speech  Volume:  Normal  Mood:  Anxious and Irritable  Affect:  Congruent  Thought Process:  Disorganized and Irrelevant  Orientation:  Other:  self and place only   Thought Content:  Delusions, Hallucinations: patient denies but appears to attend to stimuli at times, Paranoid Ideation and Rumination  Suicidal Thoughts:  No  Homicidal Thoughts:  No  Memory:  Immediate;   Poor Recent;   Poor Remote;   Poor  Judgement:  Impaired  Insight:  Lacking  Psychomotor Activity:  Normal  Concentration:  Poor  Recall:  Poor  Fund of Knowledge:Poor  Language: Fair  Akathisia:  No  Handed:  Right  AIMS (if indicated):     Assets:  Communication Skills Social Support  Sleep:      Musculoskeletal: Strength & Muscle Tone: within normal limits Gait & Station: normal Patient leans: N/A  Treatment Plan Summary: Daily contact with patient to assess and evaluate symptoms and progress in treatment Medication management admit to inpatient psychiatric for stabilization of mood and thought processes.   Kennedy Bucker PMH-NP  07/21/2014 3:17 PM  Patient seen, evaluated and I agree with notes by Nurse Practitioner. Corena Pilgrim, MD

## 2014-07-21 NOTE — ED Notes (Signed)
Transportation called.  [place] [employer] on the way.

## 2014-07-21 NOTE — ED Notes (Signed)
[place]ed [place] [facility].

## 2014-07-21 NOTE — ED Notes (Signed)
Patient mood remains labile.  She is currently visiting with her sister-in-law.  She is calm and cooperative at this time, however, earlier she was threatening toward staff.  She attempted to lash out at [facility].  Patient is able to calm herself, so no prns have been given at this point. She remains paranoid and guarded.  Patient is selective on what medications she will take, particularly her lithium.  She states, "the doctor told me I don't have to take that."  She is disoriented to person, time and date.  She states, "[facility]  She explained that her husband passed in 2014, at which time she became very tearful and sad.  She states, "Look at this body.  I am old!"  Patient denies any SI/HI/AVH.  Continue to assess mood and agitation and medicate as necessary.  Assure patient of her safety.

## 2014-07-21 NOTE — BH Assessment (Signed)
[facility] called and said Pt has been [facility].  Harlin RainFord [name] Ria CommentWarrick Jr, LPC, [facility] Triage Specialist [phone]

## 2014-07-21 NOTE — ED Notes (Signed)
[facility] [employer].

## 2014-07-21 NOTE — Progress Notes (Signed)
[facility], Accepting is Dr. Daun PeacockPalombo [phone] bed is ready. Pt to be transported under [facility] to [place].   Byrd HesselbachKristen Daren Doswell, LCSW [phone]  ED CSW 07/21/2014 2:26 PM

## 2014-07-21 NOTE — BH Assessment (Signed)
Spoke with Victorino DikeJennifer from [facility] who requested a copy of pt's UA and EKG. [facility].

## 2014-07-21 NOTE — ED Notes (Signed)
Attempted to admin. 1 mg ativan po to patient while sister-in-law was visiting.  She spit pill out on the floor.  She is currently resting quietly.

## 2014-07-21 NOTE — ED Notes (Signed)
Pt. To [facility] from ED ambulatory without difficulty, to room 37. Pt. Is alert yelling and somewhat combative, trying to kick staff. Pt. Encouraged to let Nursing staff know of any concerns or needs. Security camera monitoring and Q15 minute rounds started.

## 2014-07-25 ENCOUNTER — Encounter: Payer: Self-pay | Admitting: Cardiovascular Disease

## 2014-08-04 ENCOUNTER — Emergency Department (HOSPITAL_COMMUNITY)
Admission: EM | Admit: 2014-08-04 | Discharge: 2014-08-06 | Disposition: A | Discharge status: Home / Self Care | Payer: Medicare Other | Attending: Emergency Medicine | Admitting: Emergency Medicine

## 2014-08-04 ENCOUNTER — Emergency Department (HOSPITAL_COMMUNITY): Payer: Medicare Other

## 2014-08-04 ENCOUNTER — Other Ambulatory Visit: Payer: Self-pay

## 2014-08-04 DIAGNOSIS — M1990 Unspecified osteoarthritis, unspecified site: Secondary | ICD-10-CM | POA: Insufficient documentation

## 2014-08-04 DIAGNOSIS — Z720 Tobacco use: Secondary | ICD-10-CM | POA: Insufficient documentation

## 2014-08-04 DIAGNOSIS — F29 Unspecified psychosis not due to a substance or known physiological condition: Secondary | ICD-10-CM | POA: Diagnosis present

## 2014-08-04 DIAGNOSIS — Z8673 Personal history of transient ischemic attack (TIA), and cerebral infarction without residual deficits: Secondary | ICD-10-CM | POA: Diagnosis not present

## 2014-08-04 DIAGNOSIS — I10 Essential (primary) hypertension: Secondary | ICD-10-CM | POA: Diagnosis not present

## 2014-08-04 DIAGNOSIS — L03116 Cellulitis of left lower limb: Secondary | ICD-10-CM | POA: Diagnosis not present

## 2014-08-04 DIAGNOSIS — T148XXA Other injury of unspecified body region, initial encounter: Secondary | ICD-10-CM

## 2014-08-04 DIAGNOSIS — Z950 Presence of cardiac pacemaker: Secondary | ICD-10-CM | POA: Diagnosis not present

## 2014-08-04 DIAGNOSIS — M7981 Nontraumatic hematoma of soft tissue: Secondary | ICD-10-CM | POA: Insufficient documentation

## 2014-08-04 DIAGNOSIS — F99 Mental disorder, not otherwise specified: Secondary | ICD-10-CM | POA: Insufficient documentation

## 2014-08-04 DIAGNOSIS — F3164 Bipolar disorder, current episode mixed, severe, with psychotic features: Secondary | ICD-10-CM

## 2014-08-04 DIAGNOSIS — N390 Urinary tract infection, site not specified: Secondary | ICD-10-CM | POA: Diagnosis not present

## 2014-08-04 DIAGNOSIS — R011 Cardiac murmur, unspecified: Secondary | ICD-10-CM | POA: Diagnosis not present

## 2014-08-04 DIAGNOSIS — H9190 Unspecified hearing loss, unspecified ear: Secondary | ICD-10-CM | POA: Diagnosis not present

## 2014-08-04 DIAGNOSIS — Z9049 Acquired absence of other specified parts of digestive tract: Secondary | ICD-10-CM | POA: Diagnosis not present

## 2014-08-04 DIAGNOSIS — R1084 Generalized abdominal pain: Secondary | ICD-10-CM | POA: Diagnosis not present

## 2014-08-04 LAB — COMPREHENSIVE METABOLIC PANEL
ALT: 27 U/L (ref 0–35)
ANION GAP: 12 (ref 5–15)
AST: 50 U/L — ABNORMAL HIGH (ref 0–37)
Albumin: 3.3 g/dL — ABNORMAL LOW (ref 3.5–5.2)
Alkaline Phosphatase: 86 U/L (ref 39–117)
BILIRUBIN TOTAL: 0.3 mg/dL (ref 0.3–1.2)
BUN: 30 mg/dL — AB (ref 6–23)
CHLORIDE: 102 meq/L (ref 96–112)
CO2: 21 mEq/L (ref 19–32)
Calcium: 10.6 mg/dL — ABNORMAL HIGH (ref 8.4–10.5)
Creatinine, Ser: 1.67 mg/dL — ABNORMAL HIGH (ref 0.50–1.10)
GFR calc Af Amer: 37 mL/min — ABNORMAL LOW (ref >90)
GFR calc non Af Amer: 32 mL/min — ABNORMAL LOW (ref >90)
Glucose, Bld: 105 mg/dL — ABNORMAL HIGH (ref 70–99)
Potassium: 3.7 mEq/L (ref 3.7–5.3)
Sodium: 135 mEq/L — ABNORMAL LOW (ref 137–147)
TOTAL PROTEIN: 5.9 g/dL — AB (ref 6.0–8.3)

## 2014-08-04 LAB — URINALYSIS, ROUTINE W REFLEX MICROSCOPIC
Bilirubin Urine: NEGATIVE
Glucose, UA: NEGATIVE mg/dL
HGB URINE DIPSTICK: NEGATIVE
Ketones, ur: NEGATIVE mg/dL
Nitrite: NEGATIVE
Protein, ur: NEGATIVE mg/dL
SPECIFIC GRAVITY, URINE: 1.014 (ref 1.005–1.030)
Urobilinogen, UA: 0.2 mg/dL (ref 0.0–1.0)
pH: 6 (ref 5.0–8.0)

## 2014-08-04 LAB — URINE MICROSCOPIC-ADD ON

## 2014-08-04 LAB — CBC
HEMATOCRIT: 33.8 % — AB (ref 36.0–46.0)
Hemoglobin: 10.8 g/dL — ABNORMAL LOW (ref 12.0–15.0)
MCH: 29.2 pg (ref 26.0–34.0)
MCHC: 32 g/dL (ref 30.0–36.0)
MCV: 91.4 fL (ref 78.0–100.0)
Platelets: 214 10*3/uL (ref 150–400)
RBC: 3.7 MIL/uL — ABNORMAL LOW (ref 3.87–5.11)
RDW: 14.6 % (ref 11.5–15.5)
WBC: 11.1 10*3/uL — ABNORMAL HIGH (ref 4.0–10.5)

## 2014-08-04 LAB — RAPID URINE DRUG SCREEN, HOSP PERFORMED
Amphetamines: NOT DETECTED
Barbiturates: NOT DETECTED
Benzodiazepines: NOT DETECTED
COCAINE: NOT DETECTED
Opiates: NOT DETECTED
Tetrahydrocannabinol: NOT DETECTED

## 2014-08-04 LAB — SALICYLATE LEVEL

## 2014-08-04 LAB — ETHANOL

## 2014-08-04 LAB — ACETAMINOPHEN LEVEL: Acetaminophen (Tylenol), Serum: <15 ug/mL (ref 10–30)

## 2014-08-04 MED ORDER — LITHIUM CARBONATE ER 450 MG PO TBCR
450.0000 mg | EXTENDED_RELEASE_TABLET | Freq: Two times a day (BID) | ORAL | Status: DC
  Administered 2014-08-04 – 2014-08-05 (×2): 450 mg via ORAL
  Filled 2014-08-04 (×4): qty 1

## 2014-08-04 MED ORDER — DIAZEPAM 5 MG PO TABS
5.0000 mg | ORAL_TABLET | Freq: Every evening | ORAL | Status: DC | PRN
  Administered 2014-08-04: 5 mg via ORAL
  Filled 2014-08-04: qty 1

## 2014-08-04 MED ORDER — DOCUSATE SODIUM 100 MG PO CAPS
100.0000 mg | ORAL_CAPSULE | Freq: Two times a day (BID) | ORAL | Status: DC
  Administered 2014-08-04 – 2014-08-05 (×2): 100 mg via ORAL
  Filled 2014-08-04 (×4): qty 1

## 2014-08-04 MED ORDER — ONDANSETRON HCL 4 MG PO TABS: 4.0000 mg | ORAL_TABLET | Freq: Three times a day (TID) | ORAL | Status: DC | PRN

## 2014-08-04 MED ORDER — SULFAMETHOXAZOLE-TRIMETHOPRIM 800-160 MG PO TABS
1.0000 | ORAL_TABLET | Freq: Two times a day (BID) | ORAL | Status: DC
  Administered 2014-08-04 – 2014-08-06 (×3): 1 via ORAL
  Filled 2014-08-04 (×4): qty 1

## 2014-08-04 MED ORDER — ALUM & MAG HYDROXIDE-SIMETH 200-200-20 MG/5ML PO SUSP: 30.0000 mL | ORAL | Status: DC | PRN

## 2014-08-04 MED ORDER — ZOLPIDEM TARTRATE 5 MG PO TABS: 5.0000 mg | ORAL_TABLET | Freq: Every evening | ORAL | Status: DC | PRN

## 2014-08-04 MED ORDER — CEPHALEXIN 500 MG PO CAPS
500.0000 mg | ORAL_CAPSULE | Freq: Three times a day (TID) | ORAL | Status: DC
  Administered 2014-08-04 – 2014-08-06 (×4): 500 mg via ORAL
  Filled 2014-08-04 (×5): qty 1

## 2014-08-04 MED ORDER — QUETIAPINE FUMARATE 100 MG PO TABS
200.0000 mg | ORAL_TABLET | Freq: Three times a day (TID) | ORAL | Status: DC
  Administered 2014-08-04 – 2014-08-05 (×2): 200 mg via ORAL
  Filled 2014-08-04: qty 2

## 2014-08-04 MED ORDER — IBUPROFEN 200 MG PO TABS
600.0000 mg | ORAL_TABLET | Freq: Three times a day (TID) | ORAL | Status: DC | PRN
  Administered 2014-08-04: 600 mg via ORAL
  Filled 2014-08-04: qty 3

## 2014-08-04 MED ORDER — NICOTINE 21 MG/24HR TD PT24
21.0000 mg | MEDICATED_PATCH | Freq: Every day | TRANSDERMAL | Status: DC
  Administered 2014-08-06: 21 mg via TRANSDERMAL
  Filled 2014-08-04 (×2): qty 1

## 2014-08-04 MED ORDER — LORAZEPAM 1 MG PO TABS
1.0000 mg | ORAL_TABLET | Freq: Three times a day (TID) | ORAL | Status: DC | PRN
  Administered 2014-08-06 (×2): 1 mg via ORAL
  Filled 2014-08-04 (×2): qty 1

## 2014-08-04 MED ORDER — FLUPHENAZINE HCL 10 MG PO TABS
10.0000 mg | ORAL_TABLET | Freq: Two times a day (BID) | ORAL | Status: DC
  Administered 2014-08-04 – 2014-08-05 (×2): 10 mg via ORAL
  Filled 2014-08-04 (×6): qty 1

## 2014-08-04 MED ORDER — ACETAMINOPHEN 325 MG PO TABS: 650.0000 mg | ORAL_TABLET | ORAL | Status: DC | PRN

## 2014-08-04 MED ORDER — MECLIZINE HCL 25 MG PO TABS: 50.0000 mg | ORAL_TABLET | Freq: Three times a day (TID) | ORAL | Status: DC | PRN

## 2014-08-04 MED ORDER — IOHEXOL 300 MG/ML  SOLN
80.0000 mL | Freq: Once | INTRAMUSCULAR | Status: AC | PRN
  Administered 2014-08-04: 80 mL via INTRAVENOUS

## 2014-08-04 MED ORDER — ASPIRIN 81 MG PO CHEW
81.0000 mg | CHEWABLE_TABLET | Freq: Every day | ORAL | Status: DC
  Administered 2014-08-04 – 2014-08-05 (×2): 81 mg via ORAL
  Filled 2014-08-04 (×3): qty 1

## 2014-08-04 MED ORDER — IOHEXOL 300 MG/ML  SOLN: 50.0000 mL | Freq: Once | INTRAMUSCULAR | Status: AC | PRN

## 2014-08-04 MED ORDER — LISINOPRIL 10 MG PO TABS
10.0000 mg | ORAL_TABLET | Freq: Every day | ORAL | Status: DC
  Filled 2014-08-04 (×3): qty 1

## 2014-08-04 MED ORDER — FOLIC ACID 400 MCG PO TABS
400.0000 ug | ORAL_TABLET | Freq: Every day | ORAL | Status: DC
  Filled 2014-08-04: qty 1

## 2014-08-04 NOTE — ED Notes (Signed)
Per IVC papers, [facility] because pt appeared to be a danger to herself. Pt was discharged on 12/7 and [facility]. Pt sufferers from psychosis, bi-polar, and schizophrenia. Pt was discharge from [facility] on 12[place]15. Pt was prescribed medication for condition, which she initially did take. The following Saturday, pt refused any medication and has not taken medication since. Pt speaking at times in what appears to be an irrational manner. Pt states that she is being gassed. Pt speaks to Elvis being in another room. Pt continually talks to herself and refuses to listen to anyone. Pt is a danger to herself".

## 2014-08-04 NOTE — BH Assessment (Signed)
Received call for assessment. Spoke to Dr. Elwin MochaBlair Walden who said Pt has a history of psychosis, was recently psychiatrically hospitalized and is presenting today with similar psychotic symptoms. Tele-assessment will be initiated.  Harlin RainFord [name] Ria CommentWarrick Jr, LPC, [facility] Triage Specialist [phone]

## 2014-08-04 NOTE — ED Provider Notes (Signed)
CSN: [id number]     Arrival date & time 08/04/14  1448 History   First MD Initiated Contact with Patient 08/04/14 1505     Chief Complaint  Patient presents with  . IVC, psychosis      (Consider location/radiation/quality/duration/timing/severity/associated sxs/prior Treatment) Patient is a 58 y.o. female presenting with mental health disorder. The history is provided by the patient.  Mental Health Problem Presenting symptoms: hallucinations (hearing voices telling her to hurt herself and her family)   Presenting symptoms: no suicidal thoughts   Degree of incapacity (severity):  Moderate Onset quality:  Gradual Timing:  Constant Progression:  Worsening Chronicity:  Recurrent Context: noncompliance   Treatment compliance:  Some of the time Relieved by:  Nothing Worsened by:  Nothing tried Associated symptoms: abdominal pain   Risk factors: hx of mental illness     Level V caveat - Psych disorder Past Medical History  Diagnosis Date  . Arthritis   . Hypertension   . Nerve damage   . Osteoporosis   . Hearing loss   . Abdominal pain   . Stroke     TIA's  . Bradycardia, sinus, persistent, severe   . 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 Bipolar 1 disorder   . Heart murmur 08/13/2012    EF 60-65%,severe LVH,Mild AOV regurg, MR,trivial TR  . Mild carotid artery disease 05/05/2005,08/13/2012    left ICA 0-49% diameter reduction 2006; bilateral fibrous plaque mild 2013  . LVH (left ventricular hypertrophy)    Past Surgical History  Procedure Laterality Date  . Cholecystectomy  1980  . Appendectomy  1983  . Total knee arthroplasty  2003    left  . Permanent pacemaker insertion  01/01/2005    Medtronic EnRhythm  . Permanent pacemaker generator change  06/08/2011    Medtronic Adapta  . Tee without cardioversion  01/06/2005    no cardiac source of embolus   No family history on file. History  Substance Use  Topics  . Smoking status: Current Every Day Smoker -- 0.50 packs/day    Types: Cigarettes  . Smokeless tobacco: Not on file  . Alcohol Use: No   OB History    No data available     Review of Systems  Unable to perform ROS: Psychiatric disorder  Gastrointestinal: Positive for abdominal pain.  Psychiatric/Behavioral: Positive for hallucinations (hearing voices telling her to hurt herself and her family). Negative for suicidal ideas.      Allergies  Floxin; Plavix; and Epinephrine  Home Medications   Prior to Admission medications   Medication Sig Start Date End Date Taking? Authorizing Provider  aspirin 81 MG chewable tablet Chew 1 tablet (81 mg total) by mouth daily. 07/21/14   Bonnetta BarryShelly Eisbach, NP  diazepam (VALIUM) 5 MG tablet Take 1 tablet (5 mg total) by mouth at bedtime as needed for anxiety. 07/21/14   Bonnetta BarryShelly Eisbach, NP  lisinopril (PRINIVIL,ZESTRIL) 10 MG tablet Take 1 tablet (10 mg total) by mouth daily. 07/21/14   Bonnetta BarryShelly Eisbach, NP  lithium carbonate 300 MG capsule Take 1 capsule (300 mg total) by mouth 2 (two) times daily with a meal. 07/21/14   Bonnetta BarryShelly Eisbach, NP  QUEtiapine (SEROQUEL) 200 MG tablet Take 1 tablet (200 mg total) by mouth 3 (three) times daily. 07/21/14   Bonnetta BarryShelly Eisbach, NP   There were no vitals taken for this visit. Physical Exam  Constitutional: She is oriented to person, place, and time. She appears well-developed and  well-nourished. No distress.  HENT:  Head: Normocephalic and atraumatic.  Mouth/Throat: Oropharynx is clear and moist.  Eyes: EOM are normal. Pupils are equal, round, and reactive to light.  Neck: Normal range of motion. Neck supple.  Cardiovascular: Normal rate and regular rhythm.  Exam reveals no friction rub.   No murmur heard. Pulmonary/Chest: Effort normal and breath sounds normal. No respiratory distress. She has no wheezes. She has no rales.  Abdominal: Soft. She exhibits no distension. There is tenderness (diffuse). There is no  rebound.  Musculoskeletal: Normal range of motion. She exhibits no edema.       Left forearm: She exhibits tenderness (distal forearm).       Arms:      Legs: Neurological: She is alert and oriented to person, place, and time. No cranial nerve deficit or sensory deficit. GCS eye subscore is 4. GCS verbal subscore is 4. GCS motor subscore is 6.  Skin: She is not diaphoretic.  Nursing note and vitals reviewed.   ED Course  Procedures (including critical care time) Labs Review Labs Reviewed  CBC - Abnormal; Notable for the following:    WBC 11.1 (*)    RBC 3.70 (*)    Hemoglobin 10.8 (*)    HCT 33.8 (*)    All other components within normal limits  ACETAMINOPHEN LEVEL  COMPREHENSIVE METABOLIC PANEL  ETHANOL  SALICYLATE LEVEL  URINE RAPID DRUG SCREEN (HOSP PERFORMED)  URINALYSIS, ROUTINE W REFLEX MICROSCOPIC    Imaging Review Dg Chest 2 View  08/04/2014   CLINICAL DATA:  Bilateral forearm swelling, psychiatric evaluation  EXAM: CHEST  2 VIEW  COMPARISON:  01/04/2013  FINDINGS: Borderline cardiomegaly. Dual lead cardiac pacemaker in place. No acute infiltrate or pulmonary edema. Mild degenerative changes thoracic spine.  IMPRESSION: No active cardiopulmonary disease.   Electronically Signed   By: Natasha Mead M.D.   On: 08/04/2014 18:04   Dg Forearm Left  08/04/2014   CLINICAL DATA:  Bilateral forearm bruising, left tibia fibula swelling and redness, chest pain  EXAM: LEFT FOREARM - 2 VIEW  COMPARISON:  None.  FINDINGS: Two views of left forearm submitted. No acute fracture or subluxation. No periosteal reaction or bony erosion.  IMPRESSION: Negative.   Electronically Signed   By: Natasha Mead M.D.   On: 08/04/2014 18:01   Dg Forearm Right  08/04/2014   CLINICAL DATA:  Bilateral forearm bruising  EXAM: RIGHT FOREARM - 2 VIEW  COMPARISON:  None.  FINDINGS: Two views of the right forearm submitted. No acute fracture or subluxation. No radiopaque foreign body.  IMPRESSION: Negative.    Electronically Signed   By: Natasha Mead M.D.   On: 08/04/2014 18:01   Dg Tibia/fibula Left  08/04/2014   CLINICAL DATA:  Bilateral forearm bruising left tibia and fibular redness and swelling roof  EXAM: LEFT TIBIA AND FIBULA - 2 VIEW  COMPARISON:  None.  FINDINGS: Four views of the left tibia fibula submitted. No acute fracture or subluxation. Diffuse soft tissue swelling probable mild subcutaneous edema. Left knee prosthesis in anatomic alignment. No evidence of prosthesis loosening. The visualized ankle joint is unremarkable.  IMPRESSION: No acute fracture or subluxation. Diffuse soft tissue swelling probable subcutaneous edema.   Electronically Signed   By: Natasha Mead M.D.   On: 08/04/2014 18:03   Ct Abdomen Pelvis W Contrast  08/04/2014   CLINICAL DATA:  Abdominal bruising, question of prior trauma/fall. Patient is poor historian.  EXAM: CT ABDOMEN AND PELVIS WITH CONTRAST  TECHNIQUE: Multidetector CT imaging of the abdomen and pelvis was performed using the standard protocol following bolus administration of intravenous contrast.  CONTRAST:  80mL OMNIPAQUE IOHEXOL 300 MG/ML  SOLN  COMPARISON:  None available.  FINDINGS: The visualized lung bases are clear. Pacemaker electrodes partially visualized.  The liver demonstrates a normal contrast enhanced appearance. Gallbladder is absent. Spleen is normal. No perisplenic hematoma.  2 cm hypodense nodule seen within the left adrenal gland, indeterminate. Right adrenal gland unremarkable. Pancreas within normal limits.  Kidneys are equal in size with symmetric enhancement. No nephrolithiasis, hydronephrosis, or focal enhancing renal mass.  Stomach within normal limits. No evidence for obstruction. No abnormal wall thickening, mucosal enhancement, or inflammatory fat stranding seen about the bowels. Mild colonic diverticulosis present without acute diverticulitis. No evidence for appendicitis.  Bladder within normal limits.  Uterus and ovaries are unremarkable.   No free air or fluid. No pathologically enlarged intra-abdominal or pelvic lymph nodes.  Incidental note made of a left-sided IVC, seen to cross back over to the right-sided of the abdomen at the level of the renal veins.  No acute osseous abnormality. No worrisome lytic or blastic osseous lesions.  IMPRESSION: 1. No CT evidence for acute intra-abdominal or pelvic process. 2. Colonic diverticulosis without acute diverticulitis. 3. 2 cm hypodense left adrenal nodule, indeterminate, but may reflect a lipid poor adrenal adenoma. 4. Status post cholecystectomy. 5. Left-sided IVC.   Electronically Signed   By: Rise MuBenjamin  McClintock M.D.   On: 08/04/2014 18:40     EKG Interpretation   Date/Time:  Monday August 04 2014 16:42:03 EST Ventricular Rate:  77 PR Interval:  246 QRS Duration: 92 QT Interval:  420 QTC Calculation: 475 R Axis:   73 Text Interpretation:  Electronic atrial pacemaker Pacer spikes new  Confirmed by Gwendolyn GrantWALDEN  MD, Karima Carrell ([id number]) on 08/04/2014 6:05:24 PM      MDM   Final diagnoses:  Bruising  Psychiatric disturbance    13F here under IVC. [facility], per IVC hasn't been taking her meds. Responding to internal stimuli here, hears voices, telling her to hurt herself and her family.  Diffuse bruising on arms, tenderness of both forearms. Bruising on L ankle with tenderness - will do xrays.  Diffuse abdominal tenderness, will scan. Will do EKG, CXR, other requirements for GeriPsych.  Labs show UTI, will treat with bactrim and keflex to cover UTI and left lower extremity cellulitis.  [facility] has [facility] the patient and is recommending admission, will attempt placement back [facility].    Elwin MochaBlair Labria Wos, MD 08/04/14 [phone]
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
## 2014-08-05 ENCOUNTER — Encounter (HOSPITAL_COMMUNITY): Payer: Self-pay

## 2014-08-05 DIAGNOSIS — F3164 Bipolar disorder, current episode mixed, severe, with psychotic features: Secondary | ICD-10-CM | POA: Insufficient documentation

## 2014-08-05 MED ORDER — BENZTROPINE MESYLATE 1 MG PO TABS
0.5000 mg | ORAL_TABLET | Freq: Two times a day (BID) | ORAL | Status: DC
  Administered 2014-08-05 – 2014-08-06 (×2): 0.5 mg via ORAL
  Filled 2014-08-05 (×3): qty 1

## 2014-08-05 MED ORDER — TRAZODONE HCL 50 MG PO TABS: 50.0000 mg | ORAL_TABLET | Freq: Every evening | ORAL | Status: DC | PRN

## 2014-08-05 MED ORDER — FOLIC ACID 1 MG PO TABS
0.5000 mg | ORAL_TABLET | Freq: Every day | ORAL | Status: DC
  Administered 2014-08-05 – 2014-08-06 (×2): 0.5 mg via ORAL
  Filled 2014-08-05: qty 1

## 2014-08-05 NOTE — ED Notes (Addendum)
Patient showered with assistance by Kirby CriglerShataun Russell, CNA.
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
## 2014-08-05 NOTE — ED Notes (Signed)
Report received from night shift nurse.  Patient resting quietly, readjusting herself in bed.

## 2014-08-05 NOTE — ED Notes (Signed)
Patient received lunch tray and assistance from AMR CorporationShataun Velez to set tray up.

## 2014-08-05 NOTE — ED Notes (Signed)
Bed: [id number] Expected date:  Expected time:  Means of arrival:  Comments: TCU room 26

## 2014-08-05 NOTE — B[facility] Counselor (Signed)
Per Cedric the Pt is being reviewed for admission.  Wolfgang PhoenixBrandi Abhijay Morriss, [facility] Triage Specialist

## 2014-08-05 NOTE — Progress Notes (Signed)
[facility]  [facility]   Olga CoasterKristen Praveen [place], [place] [phone]  ED CSW 08/05/2014 923am

## 2014-08-05 NOTE — BHH Counselor (Signed)
The following facilities were contacted in an attempt to place the pt:  Referral sent for review: [facility]-no beds per Ashley, but referral faxed for possible wait list [facility]- no beds per Dana, but discharges expected tomorrow, referral faxed for possible wait list [facility]- referral faxed for review [facility] per Tracy,referral faxed for review [facility] per Barbara, referral faxed for review   At Capacity: [facility] per Jessica [facility] per Julia  TTS will continue to seek placement for pt.  Kellie Kenzee Bassin, MS, CRC, LPC [facility] Triage Specialist [place]              

## 2014-08-05 NOTE — ED Notes (Signed)
Patient received lunch tray.  Patient ate her chicken, most of her mashed potatoes and her roll.

## 2014-08-05 NOTE — ED Notes (Signed)
[facility] to say [facility] has been declined there because of her chronicity and acuity.

## 2014-08-05 NOTE — ED Notes (Signed)
Patient's saline lock removed.  Patient has small skin tear lateral to where her IV was in her LAC.  Patient's arm bandaged with 4x4's, Bacitracin and cobain.

## 2014-08-06 ENCOUNTER — Encounter (HOSPITAL_COMMUNITY): Payer: Self-pay | Admitting: Registered Nurse

## 2014-08-06 MED ORDER — BENZTROPINE MESYLATE 1 MG PO TABS: 1.0000 mg | ORAL_TABLET | Freq: Two times a day (BID) | ORAL | Status: DC

## 2014-08-06 MED ORDER — LORAZEPAM 2 MG/ML IJ SOLN
2.0000 mg | Freq: Once | INTRAMUSCULAR | Status: AC
  Administered 2014-08-06: 2 mg via INTRAMUSCULAR
  Filled 2014-08-06: qty 1

## 2014-08-06 MED ORDER — SULFAMETHOXAZOLE-TRIMETHOPRIM 800-160 MG PO TABS: 1.0000 | ORAL_TABLET | Freq: Two times a day (BID) | ORAL | Status: DC

## 2014-08-06 MED ORDER — TRAZODONE HCL 100 MG PO TABS: 100.0000 mg | ORAL_TABLET | Freq: Every day | ORAL | Status: DC

## 2014-08-06 MED ORDER — CEPHALEXIN 500 MG PO CAPS: 500.0000 mg | ORAL_CAPSULE | Freq: Three times a day (TID) | ORAL | Status: DC

## 2014-08-06 NOTE — ED Notes (Signed)
Pt given crackers and water per request, sitting up on stretcher with sitter at bedside.  Watching TV.  Pt denies other needs/complaints at this time.  Calm/coop.  NAD.
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
## 2014-08-06 NOTE — Discharge Instructions (Signed)
Asymptomatic Bacteriuria Asymptomatic bacteriuria is the presence of a large number of bacteria in your urine without the usual symptoms of burning or frequent urination. The following conditions increase the risk of asymptomatic bacteriuria:  Diabetes mellitus.  Advanced age.  Pregnancy in the first trimester.  Kidney stones.  Kidney transplants.  Leaky kidney tube valve in young children (reflux). Treatment for this condition is not needed in most people and can lead to other problems such as too much yeast and growth of resistant bacteria. However, some people, such as pregnant women, do need treatment to prevent kidney infection. Asymptomatic bacteriuria in pregnancy is also associated with fetal growth restriction, premature labor, and newborn death. HOME CARE INSTRUCTIONS Monitor your condition for any changes. The following actions may help to relieve any discomfort you are feeling:  Drink enough water and fluids to keep your urine clear or pale yellow. Go to the bathroom more often to keep your bladder empty.  Keep the area around your vagina and rectum clean. Wipe yourself from front to back after urinating. SEEK IMMEDIATE MEDICAL CARE IF:  You develop signs of an infection such as:  Burning with urination.  Frequency of voiding.  Back pain.  Fever.  You have blood in the urine.  You develop a fever. MAKE SURE YOU:  Understand these instructions.  Will watch your condition.  Will get help right away if you are not doing well or get worse. Document Released: 08/01/2005 Document Revised: 12/16/2013 Document Reviewed: 01/21/2013 ExitCare Patient Information 2015 [employer]. This information is not intended to replace advice given to you by your health care provider. Make sure you discuss any questions you have with your health care provider.  

## 2014-08-06 NOTE — ED Notes (Addendum)
[facility], pt being reviewed for admission

## 2014-08-06 NOTE — ED Notes (Signed)
Pt becoming increasingly agitated. PO ativan given. Difficult to redirect back to room. Standing in doorway screaming, attempting to leave room.  Per charge RN awaiting outside placement

## 2014-08-06 NOTE — BH Assessment (Signed)
[facility] Assessment Progress Note  At 14:00 [facility] called.  Pt has been accepted to their facility by Dr Shane CrutchStory to Rm [id number].  Please call report to [phone]; the charge nurse will be Dawn.  The address of the facility is:       [address]      [place], [place] [address]      [phone]      Please take pt to the back of the campus at the [address]  This is where the entrance for Behavioral Health is located.  Pt's nurse has been notified.  Doylene Canninghomas Mouhamadou Gittleman, MA Triage Specialist 08/06/2014 @ 14:09

## 2014-08-06 NOTE — ED Notes (Addendum)
Pt walking around room without assistance, sitter reported pt went to bathroom earlier and did not need assistance getting up off toilet. Charge spoke with [facility] nurse, who will come assess pts ambulation.
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
## 2014-08-06 NOTE — ED Notes (Addendum)
Patient up and ambulating in hall. Patient repeatedly redirected back to room. Patient returned to room in NAD. Sitter remains at bedside.

## 2014-08-06 NOTE — ED Notes (Signed)
[facility]

## 2014-08-06 NOTE — ED Notes (Signed)
Pt cleared by psych md to come back to [facility], pt is able to ambulate sufficiently. pysch md requesting pt has 1:1 sitter for redirection. At this time pt will stay on acute side with sitter.

## 2014-10-08 ENCOUNTER — Telehealth: Payer: Self-pay | Admitting: Cardiology

## 2014-10-08 ENCOUNTER — Encounter: Payer: Medicare Other | Admitting: *Deleted

## 2014-10-08 NOTE — Telephone Encounter (Signed)
Attempted to confirm remote transmission with pt. No answer and was unable to leave a message.   

## 2014-10-09 ENCOUNTER — Encounter: Payer: Self-pay | Admitting: Cardiology

## 2015-01-07 ENCOUNTER — Emergency Department (HOSPITAL_COMMUNITY): Payer: Medicare Other

## 2015-01-07 ENCOUNTER — Inpatient Hospital Stay (HOSPITAL_COMMUNITY)
Admission: EM | Admit: 2015-01-07 | Discharge: 2015-01-29 | DRG: 870 | Disposition: A | Discharge status: Skilled Nursing Facility | Payer: Medicare Other | Attending: Internal Medicine | Admitting: Internal Medicine

## 2015-01-07 ENCOUNTER — Inpatient Hospital Stay (HOSPITAL_COMMUNITY): Payer: Medicare Other

## 2015-01-07 DIAGNOSIS — E876 Hypokalemia: Secondary | ICD-10-CM | POA: Diagnosis not present

## 2015-01-07 DIAGNOSIS — E860 Dehydration: Secondary | ICD-10-CM | POA: Diagnosis not present

## 2015-01-07 DIAGNOSIS — F061 Catatonic disorder due to known physiological condition: Secondary | ICD-10-CM | POA: Diagnosis not present

## 2015-01-07 DIAGNOSIS — G9340 Encephalopathy, unspecified: Secondary | ICD-10-CM | POA: Diagnosis not present

## 2015-01-07 DIAGNOSIS — R410 Disorientation, unspecified: Secondary | ICD-10-CM | POA: Diagnosis not present

## 2015-01-07 DIAGNOSIS — IMO0001 Reserved for inherently not codable concepts without codable children: Secondary | ICD-10-CM | POA: Insufficient documentation

## 2015-01-07 DIAGNOSIS — Z7982 Long term (current) use of aspirin: Secondary | ICD-10-CM

## 2015-01-07 DIAGNOSIS — Z452 Encounter for adjustment and management of vascular access device: Secondary | ICD-10-CM

## 2015-01-07 DIAGNOSIS — R4182 Altered mental status, unspecified: Secondary | ICD-10-CM | POA: Diagnosis present

## 2015-01-07 DIAGNOSIS — R6520 Severe sepsis without septic shock: Secondary | ICD-10-CM | POA: Diagnosis not present

## 2015-01-07 DIAGNOSIS — J810 Acute pulmonary edema: Secondary | ICD-10-CM | POA: Diagnosis not present

## 2015-01-07 DIAGNOSIS — R1310 Dysphagia, unspecified: Secondary | ICD-10-CM

## 2015-01-07 DIAGNOSIS — J9601 Acute respiratory failure with hypoxia: Secondary | ICD-10-CM | POA: Insufficient documentation

## 2015-01-07 DIAGNOSIS — J15211 Pneumonia due to Methicillin susceptible Staphylococcus aureus: Secondary | ICD-10-CM | POA: Insufficient documentation

## 2015-01-07 DIAGNOSIS — Z66 Do not resuscitate: Secondary | ICD-10-CM | POA: Diagnosis present

## 2015-01-07 DIAGNOSIS — Z950 Presence of cardiac pacemaker: Secondary | ICD-10-CM | POA: Diagnosis not present

## 2015-01-07 DIAGNOSIS — J690 Pneumonitis due to inhalation of food and vomit: Secondary | ICD-10-CM | POA: Diagnosis present

## 2015-01-07 DIAGNOSIS — F22 Delusional disorders: Secondary | ICD-10-CM | POA: Diagnosis not present

## 2015-01-07 DIAGNOSIS — E872 Acidosis: Secondary | ICD-10-CM | POA: Diagnosis present

## 2015-01-07 DIAGNOSIS — I129 Hypertensive chronic kidney disease with stage 1 through stage 4 chronic kidney disease, or unspecified chronic kidney disease: Secondary | ICD-10-CM | POA: Diagnosis not present

## 2015-01-07 DIAGNOSIS — R001 Bradycardia, unspecified: Secondary | ICD-10-CM | POA: Diagnosis present

## 2015-01-07 DIAGNOSIS — A047 Enterocolitis due to Clostridium difficile: Secondary | ICD-10-CM | POA: Diagnosis present

## 2015-01-07 DIAGNOSIS — E870 Hyperosmolality and hypernatremia: Secondary | ICD-10-CM | POA: Diagnosis present

## 2015-01-07 DIAGNOSIS — F17210 Nicotine dependence, cigarettes, uncomplicated: Secondary | ICD-10-CM | POA: Diagnosis present

## 2015-01-07 DIAGNOSIS — T501X5A Adverse effect of loop [high-ceiling] diuretics, initial encounter: Secondary | ICD-10-CM | POA: Diagnosis present

## 2015-01-07 DIAGNOSIS — R401 Stupor: Secondary | ICD-10-CM | POA: Diagnosis not present

## 2015-01-07 DIAGNOSIS — R739 Hyperglycemia, unspecified: Secondary | ICD-10-CM | POA: Diagnosis present

## 2015-01-07 DIAGNOSIS — R40243 Glasgow coma scale score 3-8: Secondary | ICD-10-CM | POA: Diagnosis not present

## 2015-01-07 DIAGNOSIS — R6521 Severe sepsis with septic shock: Secondary | ICD-10-CM | POA: Diagnosis present

## 2015-01-07 DIAGNOSIS — Z4659 Encounter for fitting and adjustment of other gastrointestinal appliance and device: Secondary | ICD-10-CM

## 2015-01-07 DIAGNOSIS — E46 Unspecified protein-calorie malnutrition: Secondary | ICD-10-CM | POA: Diagnosis not present

## 2015-01-07 DIAGNOSIS — Z978 Presence of other specified devices: Secondary | ICD-10-CM

## 2015-01-07 DIAGNOSIS — Z23 Encounter for immunization: Secondary | ICD-10-CM | POA: Diagnosis not present

## 2015-01-07 DIAGNOSIS — N179 Acute kidney failure, unspecified: Secondary | ICD-10-CM | POA: Insufficient documentation

## 2015-01-07 DIAGNOSIS — J9600 Acute respiratory failure, unspecified whether with hypoxia or hypercapnia: Secondary | ICD-10-CM | POA: Diagnosis not present

## 2015-01-07 DIAGNOSIS — J9690 Respiratory failure, unspecified, unspecified whether with hypoxia or hypercapnia: Secondary | ICD-10-CM | POA: Insufficient documentation

## 2015-01-07 DIAGNOSIS — N184 Chronic kidney disease, stage 4 (severe): Secondary | ICD-10-CM | POA: Diagnosis present

## 2015-01-07 DIAGNOSIS — J811 Chronic pulmonary edema: Secondary | ICD-10-CM | POA: Diagnosis present

## 2015-01-07 DIAGNOSIS — F3164 Bipolar disorder, current episode mixed, severe, with psychotic features: Secondary | ICD-10-CM | POA: Diagnosis present

## 2015-01-07 DIAGNOSIS — F319 Bipolar disorder, unspecified: Secondary | ICD-10-CM | POA: Diagnosis present

## 2015-01-07 DIAGNOSIS — Z515 Encounter for palliative care: Secondary | ICD-10-CM | POA: Insufficient documentation

## 2015-01-07 DIAGNOSIS — Z96652 Presence of left artificial knee joint: Secondary | ICD-10-CM | POA: Diagnosis present

## 2015-01-07 DIAGNOSIS — R400 Somnolence: Secondary | ICD-10-CM | POA: Diagnosis not present

## 2015-01-07 DIAGNOSIS — G9341 Metabolic encephalopathy: Secondary | ICD-10-CM | POA: Diagnosis not present

## 2015-01-07 DIAGNOSIS — M810 Age-related osteoporosis without current pathological fracture: Secondary | ICD-10-CM | POA: Diagnosis not present

## 2015-01-07 DIAGNOSIS — E875 Hyperkalemia: Secondary | ICD-10-CM | POA: Diagnosis not present

## 2015-01-07 DIAGNOSIS — A419 Sepsis, unspecified organism: Secondary | ICD-10-CM | POA: Diagnosis present

## 2015-01-07 DIAGNOSIS — R34 Anuria and oliguria: Secondary | ICD-10-CM | POA: Diagnosis not present

## 2015-01-07 DIAGNOSIS — I69354 Hemiplegia and hemiparesis following cerebral infarction affecting left non-dominant side: Secondary | ICD-10-CM

## 2015-01-07 DIAGNOSIS — J189 Pneumonia, unspecified organism: Secondary | ICD-10-CM | POA: Insufficient documentation

## 2015-01-07 DIAGNOSIS — H5702 Anisocoria: Secondary | ICD-10-CM | POA: Diagnosis not present

## 2015-01-07 DIAGNOSIS — L8990 Pressure ulcer of unspecified site, unspecified stage: Secondary | ICD-10-CM | POA: Insufficient documentation

## 2015-01-07 DIAGNOSIS — D689 Coagulation defect, unspecified: Secondary | ICD-10-CM | POA: Diagnosis not present

## 2015-01-07 DIAGNOSIS — H9190 Unspecified hearing loss, unspecified ear: Secondary | ICD-10-CM | POA: Diagnosis present

## 2015-01-07 DIAGNOSIS — A0472 Enterocolitis due to Clostridium difficile, not specified as recurrent: Secondary | ICD-10-CM | POA: Insufficient documentation

## 2015-01-07 LAB — COMPREHENSIVE METABOLIC PANEL
ALBUMIN: 2.6 g/dL — AB (ref 3.5–5.0)
ALT: 11 U/L — ABNORMAL LOW (ref 14–54)
AST: 22 U/L (ref 15–41)
Alkaline Phosphatase: 45 U/L (ref 38–126)
Anion gap: 8 (ref 5–15)
BILIRUBIN TOTAL: 0.6 mg/dL (ref 0.3–1.2)
BUN: 45 mg/dL — AB (ref 6–20)
CHLORIDE: 129 mmol/L — AB (ref 101–111)
CO2: 23 mmol/L (ref 22–32)
CREATININE: 2.42 mg/dL — AB (ref 0.44–1.00)
Calcium: 13.4 mg/dL (ref 8.9–10.3)
GFR calc Af Amer: 24 mL/min — ABNORMAL LOW (ref >60)
GFR, EST NON AFRICAN AMERICAN: 20 mL/min — AB (ref >60)
Glucose, Bld: 145 mg/dL — ABNORMAL HIGH (ref 65–99)
Potassium: 5.1 mmol/L (ref 3.5–5.1)
SODIUM: 160 mmol/L — AB (ref 135–145)
Total Protein: 5.8 g/dL — ABNORMAL LOW (ref 6.5–8.1)

## 2015-01-07 LAB — CBC WITH DIFFERENTIAL/PLATELET
BASOS PCT: 0 % (ref 0–1)
Basophils Absolute: 0 10*3/uL (ref 0.0–0.1)
EOS PCT: 0 % (ref 0–5)
Eosinophils Absolute: 0 10*3/uL (ref 0.0–0.7)
HCT: 42 % (ref 36.0–46.0)
HEMOGLOBIN: 12.1 g/dL (ref 12.0–15.0)
Lymphocytes Relative: 17 % (ref 12–46)
Lymphs Abs: 3.9 10*3/uL (ref 0.7–4.0)
MCH: 27.3 pg (ref 26.0–34.0)
MCHC: 28.8 g/dL — AB (ref 30.0–36.0)
MCV: 94.8 fL (ref 78.0–100.0)
MONO ABS: 1.6 10*3/uL — AB (ref 0.1–1.0)
MONOS PCT: 7 % (ref 3–12)
NEUTROS PCT: 76 % (ref 43–77)
Neutro Abs: 17.6 10*3/uL — ABNORMAL HIGH (ref 1.7–7.7)
Platelets: 194 10*3/uL (ref 150–400)
RBC: 4.43 MIL/uL (ref 3.87–5.11)
RDW: 20.4 % — ABNORMAL HIGH (ref 11.5–15.5)
WBC MORPHOLOGY: INCREASED
WBC: 23.1 10*3/uL — AB (ref 4.0–10.5)

## 2015-01-07 LAB — MRSA PCR SCREENING: MRSA BY PCR: NEGATIVE

## 2015-01-07 LAB — I-STAT ARTERIAL BLOOD GAS, ED
ACID-BASE DEFICIT: 6 mmol/L — AB (ref 0.0–2.0)
Bicarbonate: 21.9 mEq/L (ref 20.0–24.0)
O2 SAT: 97 %
PH ART: 7.161 — AB (ref 7.350–7.450)
Patient temperature: 103
TCO2: 24 mmol/L (ref 0–100)
pCO2 arterial: 63.2 mmHg (ref 35.0–45.0)
pO2, Arterial: 127 mmHg — ABNORMAL HIGH (ref 80.0–100.0)

## 2015-01-07 LAB — TSH: TSH: 0.415 u[IU]/mL (ref 0.350–4.500)

## 2015-01-07 LAB — URINE MICROSCOPIC-ADD ON

## 2015-01-07 LAB — LITHIUM LEVEL: Lithium Lvl: 0.62 mmol/L (ref 0.60–1.20)

## 2015-01-07 LAB — POCT I-STAT 3, ART BLOOD GAS (G3+)
Acid-base deficit: 10 mmol/L — ABNORMAL HIGH (ref 0.0–2.0)
Bicarbonate: 15 mEq/L — ABNORMAL LOW (ref 20.0–24.0)
O2 SAT: 98 %
PO2 ART: 105 mmHg — AB (ref 80.0–100.0)
Patient temperature: 99.31
TCO2: 16 mmol/L (ref 0–100)
pCO2 arterial: 29.5 mmHg — ABNORMAL LOW (ref 35.0–45.0)
pH, Arterial: 7.316 — ABNORMAL LOW (ref 7.350–7.450)

## 2015-01-07 LAB — GLUCOSE, CAPILLARY
Glucose-Capillary: 147 mg/dL — ABNORMAL HIGH (ref 65–99)
Glucose-Capillary: 183 mg/dL — ABNORMAL HIGH (ref 65–99)

## 2015-01-07 LAB — I-STAT TROPONIN, ED: TROPONIN I, POC: 0.05 ng/mL (ref 0.00–0.08)

## 2015-01-07 LAB — URINALYSIS, ROUTINE W REFLEX MICROSCOPIC
Glucose, UA: NEGATIVE mg/dL
HGB URINE DIPSTICK: NEGATIVE
KETONES UR: NEGATIVE mg/dL
Nitrite: NEGATIVE
Protein, ur: 100 mg/dL — AB
SPECIFIC GRAVITY, URINE: 1.018 (ref 1.005–1.030)
UROBILINOGEN UA: 0.2 mg/dL (ref 0.0–1.0)
pH: 5 (ref 5.0–8.0)

## 2015-01-07 LAB — PROTIME-INR
INR: 2.32 — AB (ref 0.00–1.49)
Prothrombin Time: 25.2 seconds — ABNORMAL HIGH (ref 11.6–15.2)

## 2015-01-07 LAB — I-STAT CG4 LACTIC ACID, ED: LACTIC ACID, VENOUS: 2.15 mmol/L — AB (ref 0.5–2.0)

## 2015-01-07 LAB — LACTIC ACID, PLASMA: Lactic Acid, Venous: 2.6 mmol/L (ref 0.5–2.0)

## 2015-01-07 LAB — PHOSPHORUS: Phosphorus: 2.1 mg/dL — ABNORMAL LOW (ref 2.5–4.6)

## 2015-01-07 LAB — BRAIN NATRIURETIC PEPTIDE: B NATRIURETIC PEPTIDE 5: 632.6 pg/mL — AB (ref 0.0–100.0)

## 2015-01-07 LAB — CBG MONITORING, ED: GLUCOSE-CAPILLARY: 136 mg/dL — AB (ref 65–99)

## 2015-01-07 MED ORDER — SODIUM CHLORIDE 0.9 % IV BOLUS (SEPSIS)
1000.0000 mL | Freq: Once | INTRAVENOUS | Status: AC
Start: 2015-01-07 — End: 2015-01-07
  Administered 2015-01-07: 1000 mL via INTRAVENOUS

## 2015-01-07 MED ORDER — FENTANYL CITRATE (PF) 100 MCG/2ML IJ SOLN: 100.0000 ug | INTRAMUSCULAR | Status: DC | PRN

## 2015-01-07 MED ORDER — DEXTROSE 5 % IV SOLN
INTRAVENOUS | Status: DC
  Administered 2015-01-07 – 2015-01-08 (×2): via INTRAVENOUS
  Administered 2015-01-08 (×2): 100 mL via INTRAVENOUS
  Administered 2015-01-09 – 2015-01-10 (×2): via INTRAVENOUS
  Administered 2015-01-10: 50 mL via INTRAVENOUS
  Administered 2015-01-11 – 2015-01-12 (×2): via INTRAVENOUS

## 2015-01-07 MED ORDER — CETYLPYRIDINIUM CHLORIDE 0.05 % MT LIQD
7.0000 mL | Freq: Four times a day (QID) | OROMUCOSAL | Status: DC
  Administered 2015-01-08 – 2015-01-29 (×80): 7 mL via OROMUCOSAL

## 2015-01-07 MED ORDER — MIDAZOLAM HCL 2 MG/2ML IJ SOLN
2.0000 mg | INTRAMUSCULAR | Status: DC | PRN
  Administered 2015-01-08 (×2): 2 mg via INTRAVENOUS
  Filled 2015-01-07: qty 2

## 2015-01-07 MED ORDER — NOREPINEPHRINE BITARTRATE 1 MG/ML IV SOLN
0.0000 ug/min | INTRAVENOUS | Status: DC
  Administered 2015-01-07: 2 ug/min via INTRAVENOUS
  Administered 2015-01-08: 3 ug/min via INTRAVENOUS
  Filled 2015-01-07: qty 4

## 2015-01-07 MED ORDER — ETOMIDATE 2 MG/ML IV SOLN
INTRAVENOUS | Status: DC | PRN
  Administered 2015-01-07: 20 mg via INTRAVENOUS

## 2015-01-07 MED ORDER — VANCOMYCIN HCL IN DEXTROSE 1-5 GM/200ML-% IV SOLN
1000.0000 mg | Freq: Once | INTRAVENOUS | Status: AC
  Administered 2015-01-07: 1000 mg via INTRAVENOUS
  Filled 2015-01-07: qty 200

## 2015-01-07 MED ORDER — SODIUM CHLORIDE 0.9 % IV BOLUS (SEPSIS)
1000.0000 mL | Freq: Once | INTRAVENOUS | Status: AC
  Administered 2015-01-07: 1000 mL via INTRAVENOUS

## 2015-01-07 MED ORDER — ACETAMINOPHEN 650 MG RE SUPP
650.0000 mg | RECTAL | Status: DC | PRN
  Administered 2015-01-07 (×2): 650 mg via RECTAL
  Filled 2015-01-07 (×2): qty 1

## 2015-01-07 MED ORDER — PROPOFOL 1000 MG/100ML IV EMUL: 0.0000 ug/kg/min | INTRAVENOUS | Status: DC

## 2015-01-07 MED ORDER — MIDAZOLAM HCL 2 MG/2ML IJ SOLN
2.0000 mg | INTRAMUSCULAR | Status: DC | PRN
  Filled 2015-01-07: qty 2

## 2015-01-07 MED ORDER — CEFEPIME HCL 1 G IJ SOLR
1.0000 g | INTRAMUSCULAR | Status: DC
  Administered 2015-01-07 – 2015-01-08 (×2): 1 g via INTRAVENOUS
  Filled 2015-01-07 (×3): qty 1

## 2015-01-07 MED ORDER — HEPARIN SODIUM (PORCINE) 5000 UNIT/ML IJ SOLN
5000.0000 [IU] | Freq: Three times a day (TID) | INTRAMUSCULAR | Status: DC
  Administered 2015-01-07 – 2015-01-29 (×62): 5000 [IU] via SUBCUTANEOUS
  Filled 2015-01-07 (×71): qty 1

## 2015-01-07 MED ORDER — VANCOMYCIN HCL IN DEXTROSE 1-5 GM/200ML-% IV SOLN: 1000.0000 mg | INTRAVENOUS | Status: AC

## 2015-01-07 MED ORDER — PIPERACILLIN-TAZOBACTAM 3.375 G IVPB 30 MIN
3.3750 g | Freq: Once | INTRAVENOUS | Status: AC
  Administered 2015-01-07: 3.375 g via INTRAVENOUS
  Filled 2015-01-07: qty 50

## 2015-01-07 MED ORDER — FENTANYL CITRATE (PF) 100 MCG/2ML IJ SOLN
100.0000 ug | INTRAMUSCULAR | Status: DC | PRN
  Administered 2015-01-07: 100 ug via INTRAVENOUS
  Filled 2015-01-07: qty 2

## 2015-01-07 MED ORDER — SUCCINYLCHOLINE CHLORIDE 20 MG/ML IJ SOLN
INTRAMUSCULAR | Status: DC | PRN
  Administered 2015-01-07: 150 mg via INTRAVENOUS

## 2015-01-07 MED ORDER — PANTOPRAZOLE SODIUM 40 MG IV SOLR
40.0000 mg | Freq: Every day | INTRAVENOUS | Status: DC
  Administered 2015-01-07 – 2015-01-08 (×2): 40 mg via INTRAVENOUS
  Filled 2015-01-07 (×3): qty 40

## 2015-01-07 MED ORDER — PROPOFOL 1000 MG/100ML IV EMUL
INTRAVENOUS | Status: AC
  Filled 2015-01-07: qty 100

## 2015-01-07 MED ORDER — FENTANYL CITRATE (PF) 100 MCG/2ML IJ SOLN
100.0000 ug | INTRAMUSCULAR | Status: DC | PRN
  Filled 2015-01-07: qty 2

## 2015-01-07 MED ORDER — SODIUM CHLORIDE 0.9 % IV SOLN
250.0000 mL | INTRAVENOUS | Status: DC | PRN
  Administered 2015-01-10 – 2015-01-14 (×4): 250 mL via INTRAVENOUS

## 2015-01-07 MED ORDER — CHLORHEXIDINE GLUCONATE 0.12 % MT SOLN
15.0000 mL | Freq: Two times a day (BID) | OROMUCOSAL | Status: DC
  Administered 2015-01-07 – 2015-01-29 (×42): 15 mL via OROMUCOSAL
  Filled 2015-01-07 (×44): qty 15

## 2015-01-07 MED ORDER — VANCOMYCIN HCL 10 G IV SOLR: 1500.0000 mg | INTRAVENOUS | Status: DC

## 2015-01-07 MED ORDER — PROPOFOL 1000 MG/100ML IV EMUL
5.0000 ug/kg/min | INTRAVENOUS | Status: DC
  Administered 2015-01-07: 10 ug/kg/min via INTRAVENOUS

## 2015-01-07 NOTE — H&P (Signed)
PULMONARY / CRITICAL CARE MEDICINE   Name: Kellie Velez MRN: [id number] DOB: May 02, 1956    ADMISSION DATE:  01/07/2015 CONSULTATION DATE:  01/07/2015  REFERRING MD :  EDP  CHIEF COMPLAINT:  AMS  INITIAL PRESENTATION:  [name] brought to [facility] ED 5/25 with AMS likely due to hypernatremia as well as aspiration PNA.  In ED, intubated for GCS 3 and PCCM called for admission..     STUDIES:  CXR 5/25 >>> hazy right infrahilar atx or infiltrate. CT head 5/25 >>> no acute process.  Remote right MCA infarct.  Chronic microvascular ischemic changes.  SIGNIFICANT EVENTS: 5/25 - admitted with AMS, probable aspiration PNA, multiple metabolic derangements.   HISTORY OF PRESENT ILLNESS:  Pt is encephalopathic; therefore, this HPI is obtained from chart review. Kellie Velez is a [name] with PMH as outlined below including Bipolar I s/p 13 ECT treatments who now resides at an extended care facility.  She was brought to [facility] ED 5/25 for AMS.  Per nursing home staff and EMS, pt had become weaker than normal over the past 3 days and on day of presentation, she was unresponsive.  On ED arrival, her GCS was 3 so she was intubated for airway protection. During intubation, large amount of thick purulent secretions / material was noted in her posterior pharynx and on tongue.  She was initially hypotensive but responded nicely to 2L IVF's.  She was febrile to 103 and had labs indicating sepsis, likely due to aspiration.  Pt also hypernatremic to 160.   PCCM was called for admission.   PAST MEDICAL HISTORY :   has a past medical history of Arthritis; Hypertension; Nerve damage; Osteoporosis; Hearing loss; Abdominal pain; Stroke; Bradycardia, sinus, persistent, severe; Presence of permanent cardiac pacemaker (06/08/2011); Bipolar 1 disorder; Heart murmur (08/13/2012); Mild carotid artery disease (05/05/2005,08/13/2012); and LVH (left ventricular hypertrophy).  has past surgical history that includes  Cholecystectomy (1980); Appendectomy (1983); Total knee arthroplasty (2003); Permanent pacemaker insertion (01/01/2005); Permanent pacemaker generator change (06/08/2011); and TEE without cardioversion (01/06/2005). Prior to Admission medications   Medication Sig Start Date End Date Taking? Authorizing Provider  aspirin 81 MG chewable tablet Chew 1 tablet (81 mg total) by mouth daily. 07/21/14   Maurice March, NP  benztropine (COGENTIN) 1 MG tablet Take 1 tablet (1 mg total) by mouth 2 (two) times daily. 08/06/14   Shuvon B Rankin, NP  cephALEXin (KEFLEX) 500 MG capsule Take 1 capsule (500 mg total) by mouth every 8 (eight) hours. 08/06/14   Shuvon B Rankin, NP  diazepam (VALIUM) 5 MG tablet Take 1 tablet (5 mg total) by mouth at bedtime as needed for anxiety. 07/21/14   Maurice March, NP  docusate sodium (COLACE) 100 MG capsule Take 100 mg by mouth 2 (two) times daily.     Historical Provider, MD  fluPHENAZine (PROLIXIN) 10 MG tablet Take 10 mg by mouth 2 (two) times daily.    Historical Provider, MD  folic acid (FOLVITE) 400 MCG tablet Take 400 mcg by mouth daily.    Historical Provider, MD  HYDROcodone-acetaminophen (NORCO/VICODIN) 5-325 MG per tablet Take 1 tablet by mouth every 6 (six) hours as needed for moderate pain.    Historical Provider, MD  lactobacillus acidophilus & bulgar (LACTINEX) chewable tablet Chew 1 tablet by mouth 3 (three) times daily with meals.    Historical Provider, MD  lisinopril (PRINIVIL,ZESTRIL) 10 MG tablet Take 1 tablet (10 mg total) by mouth daily. 07/21/14  Maurice March, NP  LORazepam (ATIVAN) 2 MG tablet Take 2 mg by mouth at bedtime.    Historical Provider, MD  meclizine (ANTIVERT) 50 MG tablet Take 50 mg by mouth 3 (three) times daily as needed for dizziness or nausea.    Historical Provider, MD  QUEtiapine (SEROQUEL) 200 MG tablet Take 1 tablet (200 mg total) by mouth 3 (three) times daily. Patient not taking: Reported on 08/04/2014 07/21/14   Maurice March, NP  sulfamethoxazole-trimethoprim (BACTRIM DS,SEPTRA DS) 800-160 MG per tablet Take 1 tablet by mouth every 12 (twelve) hours. 08/06/14   Shuvon B Rankin, NP  traZODone (DESYREL) 100 MG tablet Take 1 tablet (100 mg total) by mouth at bedtime. 08/06/14   Shuvon B Rankin, NP   Allergies  Allergen Reactions  . Floxin [Ofloxacin] Nausea And Vomiting and Rash  . Plavix [Clopidogrel Bisulfate]     "stomach pain"  . Epinephrine Other (See Comments)    "makes her feel excited"    FAMILY HISTORY:  No family history on file.  SOCIAL HISTORY:  reports that she has been smoking Cigarettes.  She has been smoking about 0.50 packs per day. She does not have any smokeless tobacco history on file. She reports that she does not drink alcohol or use illicit drugs.  REVIEW OF SYSTEMS:  Unable to obtain as pt is encephalopathic.  SUBJECTIVE:   VITAL SIGNS: Temp:  [101.5 F (38.6 C)-103 F (39.4 C)] 102.9 F (39.4 C) (05/25 1215) Pulse Rate:  [100-114] 100 (05/25 1215) Resp:  [16-27] 25 (05/25 1215) BP: (88-132)/(54-74) 88/66 mmHg (05/25 1215) SpO2:  [90 %-100 %] 100 % (05/25 1215) FiO2 (%):  [70 %-100 %] 70 % (05/25 1211) Weight:  [103.42 kg (228 lb)] 103.42 kg (228 lb) (05/25 1137) HEMODYNAMICS:   VENTILATOR SETTINGS: Vent Mode:  [-] PRVC FiO2 (%):  [70 %-100 %] 70 % Set Rate:  [16 bmp-24 bmp] 24 bmp Vt Set:  [460 mL] 460 mL PEEP:  [5 cmH20] 5 cmH20 Plateau Pressure:  [21 cmH20-27 cmH20] 21 cmH20 INTAKE / OUTPUT: Intake/Output    None     PHYSICAL EXAMINATION: General: WDWN female, in NAD. Neuro: Sedated, grimaces to painful stimuli. HEENT: Oropharynx with thick white purulent secretions. PERRL, sclerae anicteric. Cardiovascular: RRR, no M/R/G.  Lungs: Respirations even.  Coarse rhonchi bilateral bases though R > L. Abdomen: Old right sided abdominal incision noted.  BS x 4, soft, NT/ND.  Musculoskeletal: No gross deformities, no edema.  Skin: Multiple BUE skin  excoriations noted, warm, no rashes.  LABS:  CBC  Recent Labs Lab 01/07/15 1104  WBC 23.1*  HGB 12.1  HCT 42.0  PLT 194   Coag's  Recent Labs Lab 01/07/15 1104  INR 2.32*   BMET  Recent Labs Lab 01/07/15 1104  NA 160*  K 5.1  CL 129*  CO2 23  BUN 45*  CREATININE 2.42*  GLUCOSE 145*   Electrolytes  Recent Labs Lab 01/07/15 1104  CALCIUM 13.4*   Sepsis Markers  Recent Labs Lab 01/07/15 1125  LATICACIDVEN 2.15*   ABG  Recent Labs Lab 01/07/15 1126  PHART 7.161*  PCO2ART 63.2*  PO2ART 127.0*   Liver Enzymes  Recent Labs Lab 01/07/15 1104  AST 22  ALT 11*  ALKPHOS 45  BILITOT 0.6  ALBUMIN 2.6*   Cardiac Enzymes No results for input(s): TROPONINI, PROBNP in the last 168 hours. Glucose  Recent Labs Lab 01/07/15 1101  GLUCAP 136*    Imaging Ct Head Wo  Contrast  01/07/2015   CLINICAL DATA:  Altered mental status.  Code sepsis.  EXAM: CT HEAD WITHOUT CONTRAST  TECHNIQUE: Contiguous axial images were obtained from the base of the skull through the vertex without intravenous contrast.  COMPARISON:  Head CT scan 07/20/2012.  FINDINGS: Since prior examination, the patient has suffered a right MCA infarct which appears remote. There is chronic microvascular ischemic change. No evidence of acute abnormality including infarct, hemorrhage, mass lesion, mass effect, midline shift or abnormal extra-axial fluid collections identified. No hydrocephalus or pneumocephalus. Mucous retention cyst or polyp left maxillary sinus is noted.  IMPRESSION: No acute abnormality.  Remote right MCA infarct.  Chronic microvascular ischemic change.   Electronically Signed   By: Drusilla Kanner M.D.   On: 01/07/2015 12:29   Dg Chest Port 1 View  01/07/2015   CLINICAL DATA:  Respiratory failure endotracheal tube placement  EXAM: PORTABLE CHEST - 1 VIEW  COMPARISON:  08/04/2014  FINDINGS: Cardiomegaly again noted. Dual lead cardiac pacemaker is unchanged in position. Hazy  right infrahilar atelectasis or infiltrate. There is NG tube in place. Endotracheal tube with tip at the level of carina towards right main bronchus. Endotracheal tube should be retracted about 1.5 cm. There is no pneumothorax.  IMPRESSION: Dual lead cardiac pacemaker is unchanged in position. Hazy right infrahilar atelectasis or infiltrate. There is NG tube in place. Endotracheal tube with tip at the level of carina towards right main bronchus. Endotracheal tube should be retracted about 1.5 cm. There is no pneumothorax.  These results were called by telephone at the time of interpretation on 01/07/2015 at 11:53 am to Dr. Geoffery Lyons , who verbally acknowledged these results.   Electronically Signed   By: Natasha Mead M.D.   On: 01/07/2015 11:53    ASSESSMENT / PLAN:  PULMONARY OETT 5/25 >>> A: VDRF due to inability to protect airway Probable aspiration PNA P:   Full mechanical support, wean as able. ABG noted, MV increased for respiratory acidosis. VAP bundle and aggressive oral hygiene. SBT in AM if able. Abx per ID section. ABG and CXR in AM.  INFECTIOUS A:   Sepsis - likely etiology being aspiration PNA P:   BCx2 5/25 > UCx 5/25 > Sputum Cx 5/25 > Abx: Vanc, start date 5/25, day 1/x. Abx: Cefepime, start date 5/25, day 1/x.  NEUROLOGIC A:   Acute metabolic encephalopathy Hx bipolar 1 disorder P:   Sedation:  Fentanyl PRN / Versed PRN. RASS goal: 0 to -1. Daily WUA. Hold outpatient benztropine, diazepam, fluphenazine, norco, lorazepam, meclizine, quetiapine, trazodone.  CARDIOVASCULAR A:  Hypotension - resolved Hx HTN, bradycardia s/p PPM, LVH P:  Monitor hemodynamics. Repeat lactate x 1. Hold outpatient lisinopril.  RENAL A:   Hypernatremia - FWD of ~ 7L CKD IV Hypercalcemia P:   D5W @ 100cc/hr. Check ionized calcium, PTH, phosphorous.  BMP in AM.  GASTROINTESTINAL A:   GI prophylaxis Nutrition P:   SUP: Pantoprazole. NPO. TF if remains NPO > 24  hours.   HEMATOLOGIC A:   VTE Prophylaxis Coagulopathy - supratherapeutic INR P:  SCD's / Heparin. CBC and INR in AM.  ENDOCRINE A:   Hyperglycemia - no hx of DM P:   SSI if glucose consistently > 180. Check TSH.   Family updated:  Attempted to call sister Geoffery Spruce over the phone but no answer.  Interdisciplinary Family Meeting v Palliative Care Meeting:  Due by: 5/31.   Rutherford Guys, [place] [facility]  Pager: [phone]  or [phone] 01/07/2015, 1:06 PM

## 2015-01-07 NOTE — Progress Notes (Signed)
ETT pulled back 3cm per Dr. Sung AmabileSimonds.  Bilateral breath sounds noted.  No complications.  Will continue to monitor.

## 2015-01-07 NOTE — Progress Notes (Signed)
ANTIBIOTIC CONSULT NOTE - INITIAL  Pharmacy Consult for Vancomycin and Cefepime Indication: sepsis  Allergies  Allergen Reactions  . Floxin [Ofloxacin] Nausea And Vomiting and Rash  . Plavix [Clopidogrel Bisulfate]     "stomach pain"  . Epinephrine Other (See Comments)    "makes her feel excited"    Patient Measurements: Height: 5\' 5"  (165.1 cm) Weight: 228 lb (103.42 kg) IBW/kg (Calculated) : 57  Vital Signs: Temp: 102.9 F (39.4 C) (05/25 1215) Temp Source: Rectal (05/25 1110) BP: 88/66 mmHg (05/25 1215) Pulse Rate: 100 (05/25 1215) Intake/Output from previous day:   Intake/Output from this shift:    Labs:  Recent Labs  01/07/15 1104  WBC 23.1*  HGB 12.1  PLT 194  CREATININE 2.42*   Estimated Creatinine Clearance: 29.1 mL/min (by C-G formula based on Cr of 2.42). No results for input(s): VANCOTROUGH, VANCOPEAK, VANCORANDOM, GENTTROUGH, GENTPEAK, GENTRANDOM, TOBRATROUGH, TOBRAPEAK, TOBRARND, AMIKACINPEAK, AMIKACINTROU, AMIKACIN in the last 72 hours.   Microbiology: No results found for this or any previous visit (from the past 720 hour(s)).  Medical History: Past Medical History  Diagnosis Date  . Arthritis   . Hypertension   . Nerve damage   . Osteoporosis   . Hearing loss   . Abdominal pain   . Stroke     TIA's  . Bradycardia, sinus, persistent, severe   . 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 Bipolar 1 disorder   . Heart murmur 08/13/2012    EF 60-65%,severe LVH,Mild AOV regurg, MR,trivial TR  . Mild carotid artery disease 05/05/2005,08/13/2012    left ICA 0-49% diameter reduction 2006; bilateral fibrous plaque mild 2013  . LVH (left ventricular hypertrophy)     Medications:  Awaiting med rec  Assessment: 59 y.o. female presents from extended living facility after being found unresponsive this a.m. GCS 3 on arrival and required intubation. MD noted large amount of purulent  secretions during intubation. Zosyn 3.375gm IV and Vancomycin 1gm IV ~1240 in ED. To continue broad spectrum antibiotics (Vanc and Cefepime) for sepsis (likely source asp pna). Tm 103, WBC elevated to 23.1. LA.   Goal of Therapy:  Vancomycin trough level 15-20 mcg/ml  Plan:  Vancomycin 1gm IV now (in addition to 1gm already given to equal 2gm load) then 1500mg  IV q48h Cefepime 1gm IV q24h Will f/u renal function, micro data, and pt's clinical condition VT at Css  Christoper Fabianaron Shondrea Steinert, PharmD, BCPS Clinical pharmacist, pager [phone] 01/07/2015,1:11 PM

## 2015-01-07 NOTE — ED Notes (Signed)
Pt has poor oral hygiene, tongue coated with green thick coating.

## 2015-01-07 NOTE — Progress Notes (Signed)
Patient transported to room 2M11 without any complications.

## 2015-01-07 NOTE — Procedures (Signed)
Central Venous Catheter Insertion Procedure Note Kellie SellsMary E Velez [id number] 03/27/1956  Procedure: Insertion of Central Venous Catheter Indications: Assessment of intravascular volume, Drug and/or fluid administration and Frequent blood sampling  Procedure Details Consent: Unable to obtain consent because of altered level of consciousness. Time Out: Verified patient identification, verified procedure, site/side was marked, verified correct patient position, special equipment/implants available, medications/allergies/relevent history reviewed, required imaging and test results available.  Performed  Maximum sterile technique was used including antiseptics, cap, gloves, gown, hand hygiene, mask and sheet. Skin prep: Chlorhexidine; local anesthetic administered A antimicrobial bonded/coated triple lumen catheter was placed in the right internal jugular vein using the Seldinger technique.  Evaluation Blood flow good Complications: No apparent complications Patient did tolerate procedure well. Chest X-ray ordered to verify placement.  CXR: pending.  Procedure performed under direct ultrasound guidance for real time vessel cannulation.      Kellie Velez, [place] [facility] Pager: [phone]  or [phone] 01/07/2015, 6:36 PM   [facility]  Mcarthur Rossettianiel J. Tyson AliasFeinstein, MD, FACP Pgr: [phone] [facility]

## 2015-01-07 NOTE — Code Documentation (Signed)
Report given to RN on 2100--2MW-- going to rm 2MW11. Pt BP dropped after Fentanyl-- nurse aware.  Oral Hygiene done-- thick green mucus cleaned from oral cavity.

## 2015-01-07 NOTE — Code Documentation (Signed)
Critical PA at bedside-- pt responding to pain in assessment.

## 2015-01-07 NOTE — Progress Notes (Signed)
RT note-received patient from ED, re measured height, for 69" with RN and change VT to reflect 8cc/kg. Sputum obtained for sample and sent to lab.

## 2015-01-07 NOTE — ED Notes (Signed)
CCM notified of pt's family member calling-- [facility]-- [phone], cell [phone]. This is pt's HCPOA -- states pt was independent in November-- stopped taking her lithium, [place] -- [facility].

## 2015-01-07 NOTE — Progress Notes (Signed)
Patient transported to CT and back to trauma room A without any complications.  

## 2015-01-07 NOTE — ED Provider Notes (Signed)
CSN: [id number]     Arrival date & time 01/07/15  1054 History   First MD Initiated Contact with Patient 01/07/15 1059     Chief Complaint  Patient presents with  . Altered Mental Status  . Code Sepsis     (Consider location/radiation/quality/duration/timing/severity/associated sxs/prior Treatment) HPI Comments: Patient is a 59 year old female with history of bipolar disorder, LVH, TIA, pacemaker. She was brought by EMS from an extended care facility for evaluation of decreased responsiveness. According to nursing home staff and EMS, she has become weaker over the past 3 days and became unresponsive this morning. EMS was called and she was brought here. She offers no additional history due to the severity of her illness. I was told that they have been suctioning purulent material from the back of her throat. She is also on a nonrebreather and has a high oxygen requirement.  Patient is a 59 y.o. female presenting with altered mental status. The history is provided by the EMS personnel and the nursing home.  Altered Mental Status Presenting symptoms: unresponsiveness   Severity:  Severe Most recent episode:  Today Episode history:  Single Duration:  3 days Timing:  Constant Progression:  Worsening Chronicity:  New   Past Medical History  Diagnosis Date  . Arthritis   . Hypertension   . Nerve damage   . Osteoporosis   . Hearing loss   . Abdominal pain   . Stroke     TIA's  . Bradycardia, sinus, persistent, severe   . 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 Bipolar 1 disorder   . Heart murmur 08/13/2012    EF 60-65%,severe LVH,Mild AOV regurg, MR,trivial TR  . Mild carotid artery disease 05/05/2005,08/13/2012    left ICA 0-49% diameter reduction 2006; bilateral fibrous plaque mild 2013  . LVH (left ventricular hypertrophy)    Past Surgical History  Procedure Laterality Date  . Cholecystectomy  1980  . Appendectomy   1983  . Total knee arthroplasty  2003    left  . Permanent pacemaker insertion  01/01/2005    Medtronic EnRhythm  . Permanent pacemaker generator change  06/08/2011    Medtronic Adapta  . Tee without cardioversion  01/06/2005    no cardiac source of embolus   No family history on file. History  Substance Use Topics  . Smoking status: Current Every Day Smoker -- 0.50 packs/day    Types: Cigarettes  . Smokeless tobacco: Not on file  . Alcohol Use: No   OB History    No data available     Review of Systems  Unable to perform ROS     Allergies  Floxin; Plavix; and Epinephrine  Home Medications   Prior to Admission medications   Medication Sig Start Date End Date Taking? Authorizing Provider  aspirin 81 MG chewable tablet Chew 1 tablet (81 mg total) by mouth daily. 07/21/14   Maurice March, NP  benztropine (COGENTIN) 1 MG tablet Take 1 tablet (1 mg total) by mouth 2 (two) times daily. 08/06/14   Shuvon B Rankin, NP  cephALEXin (KEFLEX) 500 MG capsule Take 1 capsule (500 mg total) by mouth every 8 (eight) hours. 08/06/14   Shuvon B Rankin, NP  diazepam (VALIUM) 5 MG tablet Take 1 tablet (5 mg total) by mouth at bedtime as needed for anxiety. 07/21/14   Maurice March, NP  docusate sodium (COLACE) 100 MG capsule Take 100 mg by mouth 2 (two) times daily.  Historical Provider, MD  fluPHENAZine (PROLIXIN) 10 MG tablet Take 10 mg by mouth 2 (two) times daily.    Historical Provider, MD  folic acid (FOLVITE) 400 MCG tablet Take 400 mcg by mouth daily.    Historical Provider, MD  HYDROcodone-acetaminophen (NORCO/VICODIN) 5-325 MG per tablet Take 1 tablet by mouth every 6 (six) hours as needed for moderate pain.    Historical Provider, MD  lactobacillus acidophilus & bulgar (LACTINEX) chewable tablet Chew 1 tablet by mouth 3 (three) times daily with meals.    Historical Provider, MD  lisinopril (PRINIVIL,ZESTRIL) 10 MG tablet Take 1 tablet (10 mg total) by mouth daily. 07/21/14    Maurice March, NP  LORazepam (ATIVAN) 2 MG tablet Take 2 mg by mouth at bedtime.    Historical Provider, MD  meclizine (ANTIVERT) 50 MG tablet Take 50 mg by mouth 3 (three) times daily as needed for dizziness or nausea.    Historical Provider, MD  QUEtiapine (SEROQUEL) 200 MG tablet Take 1 tablet (200 mg total) by mouth 3 (three) times daily. Patient not taking: Reported on 08/04/2014 07/21/14   Maurice March, NP  sulfamethoxazole-trimethoprim (BACTRIM DS,SEPTRA DS) 800-160 MG per tablet Take 1 tablet by mouth every 12 (twelve) hours. 08/06/14   Shuvon B Rankin, NP  traZODone (DESYREL) 100 MG tablet Take 1 tablet (100 mg total) by mouth at bedtime. 08/06/14   Shuvon B Rankin, NP   SpO2 90% Physical Exam  Constitutional:  Patient is a 60 year old female who is acutely ill. She makes no attempt to move, open her eyes, and does not respond to loud voice or noxious stimuli.  HENT:  Head: Normocephalic and atraumatic.  Neck: Normal range of motion. Neck supple.  Cardiovascular: Normal rate, regular rhythm and normal heart sounds.   Pulmonary/Chest: She is in respiratory distress.  She is tachypneic, breath sounds are coarse with rales present. She is obtunded and having difficulty protecting her airway.  Abdominal: Soft. Bowel sounds are normal. She exhibits no distension.  Neurological:  GCS is 3.  Skin: Skin is warm and dry.  Nursing note and vitals reviewed.   ED Course  Procedures (including critical care time) Labs Review Labs Reviewed  CBG MONITORING, ED - Abnormal; Notable for the following:    Glucose-Capillary 136 (*)    All other components within normal limits  CULTURE, BLOOD (ROUTINE X 2)  CULTURE, BLOOD (ROUTINE X 2)  URINE CULTURE  COMPREHENSIVE METABOLIC PANEL  PROTIME-INR  CBC WITH DIFFERENTIAL/PLATELET  BRAIN NATRIURETIC PEPTIDE  LITHIUM LEVEL  URINALYSIS, ROUTINE W REFLEX MICROSCOPIC  I-STAT TROPOININ, ED  I-STAT CG4 LACTIC ACID, ED    Imaging Review No  results found.   EKG Interpretation   Date/Time:  Wednesday Jan 07 2015 10:56:41 EDT Ventricular Rate:  106 PR Interval:  130 QRS Duration: 75 QT Interval:  337 QTC Calculation: 447 R Axis:   79 Text Interpretation:  Sinus tachycardia Borderline low voltage, extremity  leads Confirmed by Owais Pruett  MD, Jaxsun Ciampi ([id number]) on 01/07/2015 11:14:20 AM      MDM   Final diagnoses:  Respiratory failure    Patient is a 59 year old female brought from a nursing home for evaluation of decreased responsiveness that has worsened over the past several days. She arrived here obtunded, not protecting her airway, and febrile with a temperature of 103. She was immediately evaluated by myself and the decision was made to proceed with intubation. RSI was performed using 20 mg of etomidate and 150 mg of succinylcholine.  The cords were easily visualized with a #3 MacIntosh blade and a 7.5 endotracheal tube was easily placed. Tube confirmation was confirmed with end-tidal CO2 and direct auscultation over the chest and stomach.  Code stroke was called and workup was then initiated including cultures of blood, urine, and chest x-ray. Laboratory studies were obtained which revealed a white count of 23,000, hypernatremia, and acute renal failure. She was hydrated with 2 L of normal saline then given vancomycin and Zosyn for presumed sepsis. I've spoken with Dr. Sung AmabileSimonds from the critical care service who agrees to evaluate and admit the patient.  CRITICAL CARE Performed by: Geoffery LyonseLo, Lashay Osborne Total critical care time: 60 minutes Critical care time was exclusive of separately billable procedures and treating other patients. Critical care was necessary to treat or prevent imminent or life-threatening deterioration. Critical care was time spent personally by me on the following activities: development of treatment plan with patient and/or surrogate as well as nursing, discussions with consultants, evaluation of patient's  response to treatment, examination of patient, obtaining history from patient or surrogate, ordering and performing treatments and interventions, ordering and review of laboratory studies, ordering and review of radiographic studies, pulse oximetry and re-evaluation of patient's condition.     Geoffery Lyonsouglas Pearson Picou, MD 01/07/15 1352

## 2015-01-08 ENCOUNTER — Inpatient Hospital Stay (HOSPITAL_COMMUNITY): Payer: Medicare Other

## 2015-01-08 DIAGNOSIS — J189 Pneumonia, unspecified organism: Secondary | ICD-10-CM | POA: Insufficient documentation

## 2015-01-08 DIAGNOSIS — H5702 Anisocoria: Secondary | ICD-10-CM

## 2015-01-08 DIAGNOSIS — R400 Somnolence: Secondary | ICD-10-CM

## 2015-01-08 LAB — POCT I-STAT 3, ART BLOOD GAS (G3+)
ACID-BASE DEFICIT: 10 mmol/L — AB (ref 0.0–2.0)
Bicarbonate: 14.4 mEq/L — ABNORMAL LOW (ref 20.0–24.0)
O2 Saturation: 100 %
PH ART: 7.34 — AB (ref 7.350–7.450)
Patient temperature: 98
TCO2: 15 mmol/L (ref 0–100)
pCO2 arterial: 26.7 mmHg — ABNORMAL LOW (ref 35.0–45.0)
pO2, Arterial: 187 mmHg — ABNORMAL HIGH (ref 80.0–100.0)

## 2015-01-08 LAB — BASIC METABOLIC PANEL
ANION GAP: 4 — AB (ref 5–15)
ANION GAP: 5 (ref 5–15)
BUN: 43 mg/dL — ABNORMAL HIGH (ref 6–20)
BUN: 40 mg/dL — AB (ref 6–20)
CALCIUM: 11.3 mg/dL — AB (ref 8.9–10.3)
CHLORIDE: 127 mmol/L — AB (ref 101–111)
CO2: 19 mmol/L — ABNORMAL LOW (ref 22–32)
CO2: 19 mmol/L — ABNORMAL LOW (ref 22–32)
Calcium: 11.5 mg/dL — ABNORMAL HIGH (ref 8.9–10.3)
Chloride: 128 mmol/L — ABNORMAL HIGH (ref 101–111)
Creatinine, Ser: 1.94 mg/dL — ABNORMAL HIGH (ref 0.44–1.00)
Creatinine, Ser: 1.79 mg/dL — ABNORMAL HIGH (ref 0.44–1.00)
GFR calc Af Amer: 31 mL/min — ABNORMAL LOW (ref >60)
GFR calc Af Amer: 34 mL/min — ABNORMAL LOW (ref >60)
GFR calc non Af Amer: 29 mL/min — ABNORMAL LOW (ref >60)
GFR, EST NON AFRICAN AMERICAN: 27 mL/min — AB (ref >60)
Glucose, Bld: 188 mg/dL — ABNORMAL HIGH (ref 65–99)
Glucose, Bld: 161 mg/dL — ABNORMAL HIGH (ref 65–99)
Potassium: 2.7 mmol/L — CL (ref 3.5–5.1)
Potassium: 3.2 mmol/L — ABNORMAL LOW (ref 3.5–5.1)
SODIUM: 150 mmol/L — AB (ref 135–145)
SODIUM: 152 mmol/L — AB (ref 135–145)

## 2015-01-08 LAB — GLUCOSE, CAPILLARY
GLUCOSE-CAPILLARY: 138 mg/dL — AB (ref 65–99)
Glucose-Capillary: 103 mg/dL — ABNORMAL HIGH (ref 65–99)
Glucose-Capillary: 125 mg/dL — ABNORMAL HIGH (ref 65–99)
Glucose-Capillary: 131 mg/dL — ABNORMAL HIGH (ref 65–99)
Glucose-Capillary: 132 mg/dL — ABNORMAL HIGH (ref 65–99)
Glucose-Capillary: 149 mg/dL — ABNORMAL HIGH (ref 65–99)
Glucose-Capillary: 169 mg/dL — ABNORMAL HIGH (ref 65–99)

## 2015-01-08 LAB — CBC
HEMATOCRIT: 29.3 % — AB (ref 36.0–46.0)
Hemoglobin: 8.8 g/dL — ABNORMAL LOW (ref 12.0–15.0)
MCH: 27.2 pg (ref 26.0–34.0)
MCHC: 30 g/dL (ref 30.0–36.0)
MCV: 90.4 fL (ref 78.0–100.0)
PLATELETS: 177 10*3/uL (ref 150–400)
RBC: 3.24 MIL/uL — AB (ref 3.87–5.11)
RDW: 20 % — ABNORMAL HIGH (ref 11.5–15.5)
WBC: 15.5 10*3/uL — AB (ref 4.0–10.5)

## 2015-01-08 LAB — PROTIME-INR
INR: 2.21 — ABNORMAL HIGH (ref 0.00–1.49)
PROTHROMBIN TIME: 24.3 s — AB (ref 11.6–15.2)

## 2015-01-08 LAB — PTH, INTACT AND CALCIUM
CALCIUM TOTAL (PTH): 12.4 mg/dL — AB (ref 8.7–10.3)
PTH: 61 pg/mL (ref 15–65)

## 2015-01-08 LAB — PHOSPHORUS: Phosphorus: 1 mg/dL — CL (ref 2.5–4.6)

## 2015-01-08 LAB — URINE CULTURE
CULTURE: NO GROWTH
Colony Count: NO GROWTH
Special Requests: NORMAL

## 2015-01-08 LAB — MAGNESIUM: Magnesium: 1.6 mg/dL — ABNORMAL LOW (ref 1.7–2.4)

## 2015-01-08 LAB — CG4 I-STAT (LACTIC ACID): Lactic Acid, Venous: 0.98 mmol/L (ref 0.5–2.0)

## 2015-01-08 LAB — CALCIUM, IONIZED: Calcium, Ionized, Serum: 7.2 mg/dL — ABNORMAL HIGH (ref 4.5–5.6)

## 2015-01-08 LAB — CORTISOL: CORTISOL PLASMA: 36 ug/dL

## 2015-01-08 MED ORDER — PRO-STAT SUGAR FREE PO LIQD
30.0000 mL | Freq: Two times a day (BID) | ORAL | Status: DC
  Administered 2015-01-08 – 2015-01-14 (×15): 30 mL
  Administered 2015-01-15: 11:00:00
  Administered 2015-01-15 – 2015-01-20 (×10): 30 mL
  Filled 2015-01-08 (×26): qty 30

## 2015-01-08 MED ORDER — POTASSIUM CHLORIDE 10 MEQ/50ML IV SOLN
10.0000 meq | INTRAVENOUS | Status: AC
  Administered 2015-01-08 (×4): 10 meq via INTRAVENOUS
  Filled 2015-01-08 (×4): qty 50

## 2015-01-08 MED ORDER — SODIUM CHLORIDE 0.9 % IV SOLN
500.0000 mg | Freq: Two times a day (BID) | INTRAVENOUS | Status: DC
  Administered 2015-01-08 – 2015-01-19 (×23): 500 mg via INTRAVENOUS
  Filled 2015-01-08 (×24): qty 5

## 2015-01-08 MED ORDER — VITAL AF 1.2 CAL PO LIQD
1000.0000 mL | ORAL | Status: DC
  Administered 2015-01-10 – 2015-01-12 (×2): 1000 mL
  Filled 2015-01-08 (×11): qty 1000

## 2015-01-08 MED ORDER — SODIUM CHLORIDE 0.9 % IV BOLUS (SEPSIS)
1000.0000 mL | Freq: Once | INTRAVENOUS | Status: AC
  Administered 2015-01-08: 1000 mL via INTRAVENOUS

## 2015-01-08 MED ORDER — VITAL HIGH PROTEIN PO LIQD
1000.0000 mL | ORAL | Status: DC
  Administered 2015-01-08: 1000 mL
  Filled 2015-01-08 (×2): qty 1000

## 2015-01-08 MED ORDER — MIDAZOLAM HCL 2 MG/2ML IJ SOLN
4.0000 mg | Freq: Once | INTRAMUSCULAR | Status: AC
  Administered 2015-01-08: 4 mg via INTRAVENOUS
  Filled 2015-01-08: qty 4

## 2015-01-08 MED ORDER — MAGNESIUM SULFATE 4 GM/100ML IV SOLN
4.0000 g | Freq: Once | INTRAVENOUS | Status: AC
  Administered 2015-01-08: 4 g via INTRAVENOUS
  Filled 2015-01-08: qty 100

## 2015-01-08 MED ORDER — POTASSIUM PHOSPHATES 15 MMOLE/5ML IV SOLN
20.0000 mmol | Freq: Once | INTRAVENOUS | Status: AC
  Administered 2015-01-08: 20 mmol via INTRAVENOUS
  Filled 2015-01-08: qty 6.67

## 2015-01-08 MED ORDER — VANCOMYCIN HCL 10 G IV SOLR
1250.0000 mg | INTRAVENOUS | Status: DC
  Administered 2015-01-08 – 2015-01-11 (×4): 1250 mg via INTRAVENOUS
  Filled 2015-01-08 (×4): qty 1250

## 2015-01-08 NOTE — Clinical Social Work Note (Signed)
Clinical Social Work Assessment  Patient Details  Name: Kellie Velez MRN: [id number] Date of Birth: 02/17/1956  Date of referral:  01/08/15               Reason for consult:  Facility Placement                Housing/Transportation Living arrangements for the past 2 months:  Skilled Nursing Facility Source of Information:   (Sister Ramiro HarvestLeann Mitnick ) Patient Interpreter Needed:  None Criminal Activity/Legal Involvement Pertinent to Current Situation/Hospitalization:  No - Comment as needed Significant Relationships:  Siblings Lives with:  Facility Resident Do you feel safe going back to the place where you live?    Need for family participation in patient care:  Yes (Comment)  Care giving concerns:  Leann reported concern is given the pt's age and medical history should she continue to take Lithium.    Social Worker assessment / plan:  CSW spoke with the pt's sister Leann. CSW introduced self and purpose of the call. Leann shared that the pt was [facility] ago. Leann reported that the pt admitted for psychological reasons Corrie Dandy(Mechele has been talking Lithium since she was [age] then she stopped talking them), but while she was there she developed some medical issues. Leann reported that [facility]. Leann reported that when the pt [facility], she looked ill. Leann reported that she asked [facility] to send the pt to the hospital because she felt something was wrong with the pt. CSW answered all questions in which the Leann inquired about. CSW will continue to follow this pt and assist with discharge as needed.   Employment status:  Retired Health and safety inspectornsurance information:  Medicare  Patient/Family's Response to care:  Leann reported that she is pleased with the care the pt receives.   Patient/Family's Understanding of and Emotional Response to Diagnosis, Current Treatment, and Prognosis: Leann  acknowledged the pt's medical/mental history. Leann shared wanting the pt to get better, so she can come home.    Emotional Assessment Appearance:  Other (Comment Required (Unable to Assess ) Attitude/Demeanor/Rapport:  Unable to Assess Affect (typically observed):  Unable to Assess Orientation:   (Pt is intubated ) Alcohol / Substance use:  Not Applicable  Discharge Needs  Concerns to be addressed:  Denies Needs/Concerns at this time Readmission within the last 30 days:  No Current discharge risk:  Psychiatric Illness Barriers to Discharge:  No Barriers Identified  Keston Seever, MSW, LCSWA [phone]

## 2015-01-08 NOTE — Progress Notes (Signed)
Critical Phos reported to Dr. Tyson AliasFeinstein.

## 2015-01-08 NOTE — H&P (Signed)
PULMONARY / CRITICAL CARE MEDICINE   Name: Kellie Velez MRN: [id number] DOB: April 02, 1956    ADMISSION DATE:  01/07/2015 CONSULTATION DATE:  01/08/2015  REFERRING MD :  EDP  CHIEF COMPLAINT:  AMS  INITIAL PRESENTATION:  [name] brought to [facility] ED 5/25 with AMS likely due to hypernatremia as well as aspiration PNA.  In ED, intubated for GCS 3 and PCCM called for admission..     STUDIES:  CXR 5/25 >>> hazy right infrahilar atx or infiltrate. CT head 5/25 >>> no acute process.  Remote right MCA infarct.  Chronic microvascular ischemic changes.  SIGNIFICANT EVENTS: 5/25 - admitted with AMS, probable aspiration PNA, multiple metabolic derangements. 5/26- pupil change, to CT now, fevers, line was placed  SUBJECTIVE:   VITAL SIGNS: Temp:  [98.6 F (37 C)-103 F (39.4 C)] 98.9 F (37.2 C) (05/26 0439) Pulse Rate:  [76-114] 76 (05/26 0400) Resp:  [15-27] 24 (05/26 0500) BP: (62-139)/(27-78) 109/59 mmHg (05/26 0445) SpO2:  [90 %-100 %] 100 % (05/26 0400) FiO2 (%):  [70 %-100 %] 70 % (05/26 0439) Weight:  [84.5 kg (186 lb 4.6 oz)-103.42 kg (228 lb)] 84.5 kg (186 lb 4.6 oz) (05/26 0355) HEMODYNAMICS: CVP:  [9 mmHg] 9 mmHg VENTILATOR SETTINGS: Vent Mode:  [-] PRVC FiO2 (%):  [70 %-100 %] 70 % Set Rate:  [16 bmp-24 bmp] 24 bmp Vt Set:  [460 mL-530 mL] 530 mL PEEP:  [5 cmH20-10 cmH20] 10 cmH20 Plateau Pressure:  [21 cmH20-29 cmH20] 22 cmH20 INTAKE / OUTPUT: Intake/Output      05/25 0701 - 05/26 0700   I.V. (mL/kg) 3674.4 (43.5)   IV Piggyback 2150   Total Intake(mL/kg) 5824.4 (68.9)   Urine (mL/kg/hr) 225   Total Output 225   Net +5599.4         PHYSICAL EXAMINATION: General: WDWN female, in NAD. Neuro: Sedated, grimaces to painful stimuli., pup rt 2, left 3  HEENT:line clean Cardiovascular: RRR, no M/R/G.  Lungs: ronchi. Abdomen: Old right sided abdominal incision noted.  BS x 4, soft, NT/ND.  Musculoskeletal: No gross deformities, no edema.  Skin: Multiple BUE skin  excoriations noted, warm, no rashes.  LABS:  CBC  Recent Labs Lab 01/07/15 1104  WBC 23.1*  HGB 12.1  HCT 42.0  PLT 194   Coag's  Recent Labs Lab 01/07/15 1104  INR 2.32*   BMET  Recent Labs Lab 01/07/15 1104 01/08/15 0030  NA 160* 152*  K 5.1 2.7*  CL 129* 128*  CO2 23 19*  BUN 45* 43*  CREATININE 2.42* 1.94*  GLUCOSE 145* 188*   Electrolytes  Recent Labs Lab 01/07/15 1104 01/07/15 1433 01/08/15 0030  CALCIUM 13.4*  --  11.5*  PHOS  --  2.1*  --    Sepsis Markers  Recent Labs Lab 01/07/15 1125 01/07/15 1442  LATICACIDVEN 2.15* 2.6*   ABG  Recent Labs Lab 01/07/15 1126 01/07/15 1712 01/08/15 0547  PHART 7.161* 7.316* 7.340*  PCO2ART 63.2* 29.5* 26.7*  PO2ART 127.0* 105.0* 187.0*   Liver Enzymes  Recent Labs Lab 01/07/15 1104  AST 22  ALT 11*  ALKPHOS 45  BILITOT 0.6  ALBUMIN 2.6*   Cardiac Enzymes No results for input(s): TROPONINI, PROBNP in the last 168 hours. Glucose  Recent Labs Lab 01/07/15 1101 01/07/15 1551 01/07/15 1954 01/07/15 2357 01/08/15 0444  GLUCAP 136* 147* 183* 169* 138*    Imaging Ct Head Wo Contrast  01/07/2015   CLINICAL DATA:  Altered mental status.  Code sepsis.  EXAM: CT HEAD WITHOUT CONTRAST  TECHNIQUE: Contiguous axial images were obtained from the base of the skull through the vertex without intravenous contrast.  COMPARISON:  Head CT scan 07/20/2012.  FINDINGS: Since prior examination, the patient has suffered a right MCA infarct which appears remote. There is chronic microvascular ischemic change. No evidence of acute abnormality including infarct, hemorrhage, mass lesion, mass effect, midline shift or abnormal extra-axial fluid collections identified. No hydrocephalus or pneumocephalus. Mucous retention cyst or polyp left maxillary sinus is noted.  IMPRESSION: No acute abnormality.  Remote right MCA infarct.  Chronic microvascular ischemic change.   Electronically Signed   By: Drusilla Kannerhomas  Dalessio M.D.    On: 01/07/2015 12:29   Dg Chest Port 1 View  01/07/2015   CLINICAL DATA:  Central line placement.  EXAM: PORTABLE CHEST - 1 VIEW  COMPARISON:  Same day.  FINDINGS: Stable cardiomediastinal silhouette. Endotracheal and nasogastric tubes are in grossly good position. Left-sided pacemaker is unchanged in position. Interval placement of right internal jugular catheter line is noted with distal tip in expected position of the cavoatrial junction. No pneumothorax is noted. Left lung is clear. Right basilar opacity is again noted concerning for pneumonia or atelectasis. No significant pleural effusion is noted.  IMPRESSION: Interval placement of right internal jugular catheter is noted with distal tip in expected position of cavoatrial junction. No pneumothorax is noted. Stable right basilar opacity is noted concerning for pneumonia or atelectasis.   Electronically Signed   By: Lupita RaiderJames  Green Jr, M.D.   On: 01/07/2015 19:01   Dg Chest Port 1 View  01/07/2015   CLINICAL DATA:  Respiratory failure endotracheal tube placement  EXAM: PORTABLE CHEST - 1 VIEW  COMPARISON:  08/04/2014  FINDINGS: Cardiomegaly again noted. Dual lead cardiac pacemaker is unchanged in position. Hazy right infrahilar atelectasis or infiltrate. There is NG tube in place. Endotracheal tube with tip at the level of carina towards right main bronchus. Endotracheal tube should be retracted about 1.5 cm. There is no pneumothorax.  IMPRESSION: Dual lead cardiac pacemaker is unchanged in position. Hazy right infrahilar atelectasis or infiltrate. There is NG tube in place. Endotracheal tube with tip at the level of carina towards right main bronchus. Endotracheal tube should be retracted about 1.5 cm. There is no pneumothorax.  These results were called by telephone at the time of interpretation on 01/07/2015 at 11:53 am to Dr. Geoffery LyonsUGLAS DELO , who verbally acknowledged these results.   Electronically Signed   By: Natasha MeadLiviu  Pop M.D.   On: 01/07/2015 11:53     ASSESSMENT / PLAN:  PULMONARY OETT 5/25 >>> A: VDRF due to inability to protect airway Probable aspiration PNA P:   abg reviewed, keep same MV Repeat abg in am  pao2 187, to goal peep 5 over 4 hr and fio2 likely to 50% If to baseline vent needs wean attempt, no extubation planned pcxr now  INFECTIOUS A:   Septic shock P:   BCx2 5/25 > UCx 5/25 > Sputum Cx 5/25 > Abx: Vanc, start date 5/25>>> Abx: Cefepime, start date 5/25>>>  Keep same abx   NEUROLOGIC A:   Acute metabolic encephalopathy Hx bipolar 1 disorder Pupil change minimal Old rt mca noted CT head neg acute within last 24 hour s P:   Sedation:  Fentanyl PRN / Versed PRN. RASS goal: 0 Daily WUA. Hold outpatient benztropine, diazepam, fluphenazine, norco, lorazepam, meclizine, quetiapine, trazodone. Unable MRI with pacer Consider eeg with old MCA stroke noted unlikely repeat CT will  show change in such short period of time, would treat Na prior to ct and follow, ensure na change not too rapid also  CARDIOVASCULAR A:  Septic shock Hx HTN, bradycardia s/p PPM, LVH P:  cvp 9, got fluids levop to map goal Cortisol now Line was required Repeat LA  RENAL A:   Hypernatremia - FWD of ~ 7L CKD IV Hypernatremia hypok P:   D5W @ 100cc/hr, get repeat na now, may need drop rate BMP in AM awaited, need this back now  GASTROINTESTINAL A:   GI prophylaxis Nutrition P:   SUP: Pantoprazole. NPO. Start TF   HEMATOLOGIC A:   VTE Prophylaxis Coagulopathy - supratherapeutic INR P:  SCD's / Heparin. CBC and INR now  ENDOCRINE A:   Hyperglycemia - no hx of DM P:   SSI if glucose consistently > 180.   Family updated:  Attempted to call sister Geoffery Spruce over the phone but no answer.  Interdisciplinary Family Meeting v Palliative Care Meeting:  Due by: 5/31.  Ccm time 30 min   Mcarthur Rossetti. Tyson Alias, MD, FACP Pgr: [phone] [facility]

## 2015-01-08 NOTE — Progress Notes (Signed)
Tube feeding not started due to CT head scheduled at 0900. Will start tube feed as soon as CT complete. Will continue to monitor pt very closely.

## 2015-01-08 NOTE — Progress Notes (Signed)
No wakeup assessment patient not on sedation, only withdraws from pain.

## 2015-01-08 NOTE — Progress Notes (Signed)
Initial Nutrition Assessment  DOCUMENTATION CODES:  Not applicable  INTERVENTION:  Tube feeding, Prostat  Initiate TF via OGT with Vital AF 1.2 at 25 ml/h and Prostat 30 ml BID on day 1; on day 2, increase to goal rate of 55 ml/h (1320 ml per day) to provide 1784 kcals, 112.5 gm protein, 1069 ml free water daily. Above TF regimen meet 100% of pt's calorie and protein needs  NUTRITION DIAGNOSIS:  Inadequate oral intake related to inability to eat as evidenced by NPO status.  GOAL:  Patient will meet greater than or equal to 90% of their needs  MONITOR:  Diet advancement, Vent status, Labs, Weight trends, Skin, I & O's  REASON FOR ASSESSMENT:  Consult Enteral/tube feeding initiation and management  ASSESSMENT: Pt is a [name] [facility] with AMS likely due to hypernatremia as well as aspiration PNA.  Pt's brother and his wife at bed side at time of visit. Per family, pt lost her husband last December and ever since then her health has been declining. [place] and family was not able to visit often, unable to provide good history, but believe that pt was on TF for some time while there. Family believes pt has lost weight recently, wt history shows 42 Lb loss (18% 6 mo, significant for time frame). Unable to perform Nutrition Focused Physical Exam due to pt being agitated. Pt is sedated on ventilator support. Will continue to monitor. Labs reviewed: Glu 125 - 161, Hgb 8.8, Na 150, K 3.2, BUN 40, Cr 1.79  MV 12.7 L/min Propofol: none Temp (24hrs), Avg:101.5 F (38.6 C), Min:97.9 F (36.6 C), Max:102.9 F (39.4 C)  Height:  Ht Readings from Last 1 Encounters:  01/07/15 5\' 9"  (1.753 m)    Weight:  Wt Readings from Last 1 Encounters:  01/08/15 186 lb 4.6 oz (84.5 kg)    Ideal Body Weight:  66 kg  Wt Readings from Last 10 Encounters:  01/08/15 186 lb 4.6 oz (84.5 kg)  07/21/14 228 lb 9 oz (103.675 kg)  07/11/14 230 lb (104.327  kg)  12/31/13 250 lb (113.399 kg)  12/15/12 228 lb 2 oz (103.477 kg)  05/20/11 238 lb 12.8 oz (108.319 kg)    BMI:  Body mass index is 27.5 kg/(m^2).  Estimated Nutritional Needs:  Kcal:  1744  Protein:  100 - 115 g  Fluid:  per MD  Skin:  Wound (see comment) (Cyanosis, MSAD groin, Stage II pressure ulcer on sacrum)  Diet Order:    NPO  EDUCATION NEEDS:  Education needs no appropriate at this time   Intake/Output Summary (Last 24 hours) at 01/08/15 1138 Last data filed at 01/08/15 0800  Gross per 24 hour  Intake 6157.8 ml  Output    225 ml  Net 5932.8 ml    Last BM:  PTA  Dody Smartt A. [facility] Dietetic Intern Pager: [phone] 01/08/2015 11:59 AM

## 2015-01-08 NOTE — Progress Notes (Signed)
ANTIBIOTIC CONSULT NOTE - FOLLOW UP  Pharmacy Consult for vancomycin, cefepime Indication: rule out pneumonia and rule out sepsis  Allergies  Allergen Reactions  . Floxin [Ofloxacin] Nausea And Vomiting and Rash  . Plavix [Clopidogrel Bisulfate]     "stomach pain"  . Epinephrine Other (See Comments)    "makes her feel excited"    Patient Measurements: Height: 5\' 9"  (175.3 cm) Weight: 186 lb 4.6 oz (84.5 kg) IBW/kg (Calculated) : 66.2 Vital Signs: Temp: 98.3 F (36.8 C) (05/26 1126) Temp Source: Oral (05/26 1126) BP: 101/49 mmHg (05/26 1200) Pulse Rate: 79 (05/26 1200) Intake/Output from previous day: 05/25 0701 - 05/26 0700 In: 6046.9 [I.V.:3896.9; IV Piggyback:2150] Out: 225 [Urine:225] Intake/Output from this shift: Total I/O In: 876.1 [I.V.:256.1; NG/GT:20; IV Piggyback:600] Out: -   Labs:  Recent Labs  01/07/15 1104 01/08/15 0030 01/08/15 0610 01/08/15 0814  WBC 23.1*  --   --  15.5*  HGB 12.1  --   --  8.8*  PLT 194  --   --  177  CREATININE 2.42* 1.94* 1.79*  --    Estimated Creatinine Clearance: 38.3 mL/min (by C-G formula based on Cr of 1.79). No results for input(s): VANCOTROUGH, VANCOPEAK, VANCORANDOM, GENTTROUGH, GENTPEAK, GENTRANDOM, TOBRATROUGH, TOBRAPEAK, TOBRARND, AMIKACINPEAK, AMIKACINTROU, AMIKACIN in the last 72 hours.   Microbiology: Recent Results (from the past 720 hour(s))  Blood culture (routine x 2)     Status: None (Preliminary result)   Collection Time: 01/07/15 11:04 AM  Result Value Ref Range Status   Specimen Description BLOOD LEFT ARM  Final   Special Requests BOTTLES DRAWN AEROBIC AND ANAEROBIC 5CC  Final   Culture   Final           BLOOD CULTURE RECEIVED NO GROWTH TO DATE CULTURE WILL BE HELD FOR 5 DAYS BEFORE ISSUING A FINAL NEGATIVE REPORT Performed at [facility]    Report Status PENDING  Incomplete  Blood culture (routine x 2)     Status: None (Preliminary result)   Collection Time: 01/07/15 11:23 AM  Result  Value Ref Range Status   Specimen Description BLOOD LEFT ARM  Final   Special Requests   Final    BOTTLES DRAWN AEROBIC AND ANAEROBIC 10CCBLUE 5CCRED   Culture   Final           BLOOD CULTURE RECEIVED NO GROWTH TO DATE CULTURE WILL BE HELD FOR 5 DAYS BEFORE ISSUING A FINAL NEGATIVE REPORT Performed at [facility]    Report Status PENDING  Incomplete  Urine culture     Status: None   Collection Time: 01/07/15 11:30 AM  Result Value Ref Range Status   Specimen Description URINE, RANDOM  Final   Special Requests Normal  Final   Colony Count NO GROWTH Performed at [facility]   Final   Culture NO GROWTH Performed at [facility]   Final   Report Status 01/08/2015 FINAL  Final  Culture, respiratory (NON-Expectorated)     Status: None (Preliminary result)   Collection Time: 01/07/15  4:13 PM  Result Value Ref Range Status   Specimen Description TRACHEAL ASPIRATE  Final   Special Requests NONE  Final   Gram Stain   Final    ABUNDANT WBC PRESENT, PREDOMINANTLY PMN NO SQUAMOUS EPITHELIAL CELLS SEEN ABUNDANT GRAM POSITIVE COCCI IN CLUSTERS Performed at [facility]    Culture PENDING  Incomplete   Report Status PENDING  Incomplete  MRSA PCR Screening     Status:  None   Collection Time: 01/07/15  4:13 PM  Result Value Ref Range Status   MRSA by PCR NEGATIVE NEGATIVE Final    Comment:        The GeneXpert MRSA Assay (FDA approved for NASAL specimens only), is one component of a comprehensive MRSA colonization surveillance program. It is not intended to diagnose MRSA infection nor to guide or monitor treatment for MRSA infections.     Anti-infectives    Start     Dose/Rate Route Frequency Ordered Stop   01/09/15 1200  vancomycin (VANCOCIN) 1,500 mg in sodium chloride 0.9 % 500 mL IVPB     1,500 mg 250 mL/hr over 120 Minutes Intravenous Every 48 hours 01/07/15 1322     01/07/15 1600  ceFEPIme (MAXIPIME) 1 g in dextrose 5 % 50 mL IVPB     1  g 100 mL/hr over 30 Minutes Intravenous Every 24 hours 01/07/15 1321     01/07/15 1330  vancomycin (VANCOCIN) IVPB 1000 mg/200 mL premix     1,000 mg 200 mL/hr over 60 Minutes Intravenous STAT 01/07/15 1321 01/07/15 1803   01/07/15 1230  vancomycin (VANCOCIN) IVPB 1000 mg/200 mL premix     1,000 mg 200 mL/hr over 60 Minutes Intravenous  Once 01/07/15 1221 01/07/15 1344   01/07/15 1230  piperacillin-tazobactam (ZOSYN) IVPB 3.375 g     3.375 g 100 mL/hr over 30 Minutes Intravenous  Once 01/07/15 1221 01/07/15 1344      Assessment: 59 y.o. female presents from extended living facility after being found unresponsive this a.m. GCS 3 on arrival and required intubation. MD noted large amount of purulent secretions during intubation. Zosyn 3.375gm IV and Vancomycin 1gm IV ~1240 in ED. To continue broad spectrum antibiotics (Vanc and Cefepime) for sepsis (likely source asp pna). Tm 103, WBC elevated to 23.1. LA 0.98. (wt corrected to 84.5 kg)  5/25 Vanc>> 5/25 Cefepime>> 5/25 Zosyn x1  5/25 Bld x2>> 5/25 Urine>>  Goal of Therapy:  Vancomycin trough level 15-20 mcg/ml  Clinical resolution of infection  Plan:  Increase Vancomycin to  IV every 24 hours for improved renal function/wt correction.  Continue Cefepime 1gm IV q24h Will f/u renal function, micro data, and pt's clinical condition VT at [facility]  Link Snuffer, PharmD, BCPS Clinical Pharmacist [phone] 01/08/2015,12:39 PM

## 2015-01-08 NOTE — Progress Notes (Signed)
eLink Physician-Brief Progress Note Patient Name: Kellie SellsMary E Velez DOB: 06/12/1956 MRN: [id number]   Date of Service  01/08/2015  HPI/Events of Note  Bedside nurse reports unequal pupils - L 3 mm and R 2 mm. Pupils reactive.   eICU Interventions  Will repeat head CT w/o contrast.     Intervention Category Intermediate Interventions: Other:  Sommer,Steven Dennard Nipugene 01/08/2015, 5:22 AM

## 2015-01-08 NOTE — Progress Notes (Signed)
eLink Physician-Brief Progress Note Patient Name: Kellie Velez DOB: 03/29/1956 MRN: [id number]   Date of Service  01/08/2015  HPI/Events of Note  Called with multiple issues: 1. K+ = 2.7 and Creatinine = 1.94 and 2. oliguria.   eICU Interventions  Will order: 1. Replete K+. 2. Monitor CVP. 2. Bolus with 0.9 NaCl 1 liter IV over 1 hour now.      Intervention Category Intermediate Interventions: Oliguria - evaluation and management;Electrolyte abnormality - evaluation and management  Sommer,Steven Eugene 01/08/2015, 1:26 AM

## 2015-01-08 NOTE — Procedures (Signed)
History: 59 yo F with altered mental status  Sedation: none  Technique: This is a 19 channel routine scalp EEG performed at the bedside with bipolar and monopolar montages arranged in accordance to the international 10/20 system of electrode placement. One channel was dedicated to EKG recording.    Background: The background consists of bursts of frontally prediominant beta activity alternating with centrally predomainant 6 Hz activity. This is not clearly positive and is not maximal at the occiput.   With stimulation, this pattern aborts with generalized irregular delta activity. Subsequently, the pattern once again resumes.   There was never any sign of evolution to suggest an ictal nature to these discharges and their sudden abortion with waking would also argue strongly against an ictal nature.    Photic stimulation: Physiologic driving is not performed  EEG Abnormalities: 1) Generalized irregular slow activity 2) Sleep-structure like activity throughout most of the recording, possibly representing 14 and 6 Hz positive bursts  Clinical Interpretation: This EEG was most consistent with a generalized non-specific cerebral dysfunction(encephalopathy). There was no seizure or seizure predisposition recorded on this study.   Ritta SlotMcNeill Kirkpatrick, MD [facility] [phone]  If 7pm- 7am, please page neurology on call as listed in AMION.

## 2015-01-08 NOTE — Progress Notes (Signed)
EEG Completed; Results Pending  

## 2015-01-08 NOTE — Care Management Note (Signed)
Case Management Note  Patient Details  Name: Kellie Velez MRN: [id number] Date of Birth: 03/20/1956  Subjective/Objective:      Found unresponsive at SNF - INtubated.  SW referral placed.              Action/Plan: CM will continue to follow.   Expected Discharge Date:                  Expected Discharge Plan:  Skilled Nursing Facility  In-House Referral:  Clinical Social Work  Discharge planning Services     Post Acute Care Choice:    Choice offered to:     DME Arranged:    DME Agency:     HH Arranged:    HH Agency:     Status of Service:  In process, will continue to follow  Medicare Important Message Given:    Date Medicare IM Given:    Medicare IM give by:    Date Additional Medicare IM Given:    Additional Medicare Important Message give by:     If discussed at Long Length of Stay Meetings, dates discussed:    Additional Comments:  Vangie BickerBrown, Iniko Robles Jane, RN 01/08/2015, 9:18 AM

## 2015-01-09 ENCOUNTER — Inpatient Hospital Stay (HOSPITAL_COMMUNITY): Payer: Medicare Other

## 2015-01-09 DIAGNOSIS — L8990 Pressure ulcer of unspecified site, unspecified stage: Secondary | ICD-10-CM | POA: Insufficient documentation

## 2015-01-09 DIAGNOSIS — J9600 Acute respiratory failure, unspecified whether with hypoxia or hypercapnia: Secondary | ICD-10-CM | POA: Insufficient documentation

## 2015-01-09 LAB — BASIC METABOLIC PANEL
Anion gap: 7 (ref 5–15)
BUN: 36 mg/dL — ABNORMAL HIGH (ref 6–20)
CHLORIDE: 123 mmol/L — AB (ref 101–111)
CO2: 16 mmol/L — ABNORMAL LOW (ref 22–32)
Calcium: 11.3 mg/dL — ABNORMAL HIGH (ref 8.9–10.3)
Creatinine, Ser: 1.46 mg/dL — ABNORMAL HIGH (ref 0.44–1.00)
GFR calc Af Amer: 44 mL/min — ABNORMAL LOW (ref >60)
GFR calc non Af Amer: 38 mL/min — ABNORMAL LOW (ref >60)
Glucose, Bld: 119 mg/dL — ABNORMAL HIGH (ref 65–99)
POTASSIUM: 3.5 mmol/L (ref 3.5–5.1)
SODIUM: 146 mmol/L — AB (ref 135–145)

## 2015-01-09 LAB — GLUCOSE, CAPILLARY
GLUCOSE-CAPILLARY: 112 mg/dL — AB (ref 65–99)
Glucose-Capillary: 120 mg/dL — ABNORMAL HIGH (ref 65–99)
Glucose-Capillary: 100 mg/dL — ABNORMAL HIGH (ref 65–99)
Glucose-Capillary: 92 mg/dL (ref 65–99)
Glucose-Capillary: 104 mg/dL — ABNORMAL HIGH (ref 65–99)

## 2015-01-09 LAB — COMPREHENSIVE METABOLIC PANEL
ALBUMIN: 1.7 g/dL — AB (ref 3.5–5.0)
ALK PHOS: 47 U/L (ref 38–126)
ALT: 24 U/L (ref 14–54)
AST: 40 U/L (ref 15–41)
Anion gap: 5 (ref 5–15)
BUN: 36 mg/dL — ABNORMAL HIGH (ref 6–20)
CO2: 15 mmol/L — ABNORMAL LOW (ref 22–32)
CREATININE: 1.63 mg/dL — AB (ref 0.44–1.00)
Calcium: 11 mg/dL — ABNORMAL HIGH (ref 8.9–10.3)
Chloride: 120 mmol/L — ABNORMAL HIGH (ref 101–111)
GFR calc non Af Amer: 33 mL/min — ABNORMAL LOW (ref >60)
GFR, EST AFRICAN AMERICAN: 38 mL/min — AB (ref >60)
Glucose, Bld: 142 mg/dL — ABNORMAL HIGH (ref 65–99)
Potassium: 2.8 mmol/L — ABNORMAL LOW (ref 3.5–5.1)
Sodium: 140 mmol/L (ref 135–145)
TOTAL PROTEIN: 4.4 g/dL — AB (ref 6.5–8.1)
Total Bilirubin: 0.4 mg/dL (ref 0.3–1.2)

## 2015-01-09 LAB — CBC WITH DIFFERENTIAL/PLATELET
Basophils Absolute: 0 10*3/uL (ref 0.0–0.1)
Basophils Relative: 0 % (ref 0–1)
EOS ABS: 0.7 10*3/uL (ref 0.0–0.7)
Eosinophils Relative: 4 % (ref 0–5)
HEMATOCRIT: 30 % — AB (ref 36.0–46.0)
Hemoglobin: 9.2 g/dL — ABNORMAL LOW (ref 12.0–15.0)
Lymphocytes Relative: 10 % — ABNORMAL LOW (ref 12–46)
Lymphs Abs: 1.8 10*3/uL (ref 0.7–4.0)
MCH: 27.1 pg (ref 26.0–34.0)
MCHC: 30.7 g/dL (ref 30.0–36.0)
MCV: 88.2 fL (ref 78.0–100.0)
Monocytes Absolute: 0.6 10*3/uL (ref 0.1–1.0)
Monocytes Relative: 4 % (ref 3–12)
NEUTROS ABS: 14.5 10*3/uL — AB (ref 1.7–7.7)
Neutrophils Relative %: 82 % — ABNORMAL HIGH (ref 43–77)
Platelets: 148 10*3/uL — ABNORMAL LOW (ref 150–400)
RBC: 3.4 MIL/uL — ABNORMAL LOW (ref 3.87–5.11)
RDW: 20.2 % — ABNORMAL HIGH (ref 11.5–15.5)
WBC: 17.7 10*3/uL — ABNORMAL HIGH (ref 4.0–10.5)

## 2015-01-09 LAB — CALCITONIN: CALCITONIN: 8.1 pg/mL — AB (ref 0.0–5.0)

## 2015-01-09 LAB — POCT I-STAT 3, ART BLOOD GAS (G3+)
Acid-base deficit: 10 mmol/L — ABNORMAL HIGH (ref 0.0–2.0)
Bicarbonate: 14.7 mEq/L — ABNORMAL LOW (ref 20.0–24.0)
O2 SAT: 100 %
PH ART: 7.327 — AB (ref 7.350–7.450)
Patient temperature: 98
TCO2: 16 mmol/L (ref 0–100)
pCO2 arterial: 28 mmHg — ABNORMAL LOW (ref 35.0–45.0)
pO2, Arterial: 205 mmHg — ABNORMAL HIGH (ref 80.0–100.0)

## 2015-01-09 LAB — PHOSPHORUS: Phosphorus: 2.5 mg/dL (ref 2.5–4.6)

## 2015-01-09 MED ORDER — METOCLOPRAMIDE HCL 5 MG/ML IJ SOLN
5.0000 mg | Freq: Two times a day (BID) | INTRAMUSCULAR | Status: DC
  Administered 2015-01-09 – 2015-01-14 (×12): 5 mg via INTRAVENOUS
  Filled 2015-01-09 (×14): qty 1

## 2015-01-09 MED ORDER — PANTOPRAZOLE SODIUM 40 MG PO PACK
40.0000 mg | PACK | Freq: Every day | ORAL | Status: DC
  Filled 2015-01-09: qty 20

## 2015-01-09 MED ORDER — PANTOPRAZOLE SODIUM 40 MG IV SOLR
40.0000 mg | Freq: Every day | INTRAVENOUS | Status: DC
  Administered 2015-01-09 – 2015-01-12 (×4): 40 mg via INTRAVENOUS
  Filled 2015-01-09 (×4): qty 40

## 2015-01-09 MED ORDER — POTASSIUM CHLORIDE 10 MEQ/50ML IV SOLN
10.0000 meq | INTRAVENOUS | Status: AC
  Administered 2015-01-09 (×6): 10 meq via INTRAVENOUS
  Filled 2015-01-09 (×6): qty 50

## 2015-01-09 MED ORDER — CEFTRIAXONE SODIUM IN DEXTROSE 40 MG/ML IV SOLN
2.0000 g | INTRAVENOUS | Status: DC
  Filled 2015-01-09: qty 50

## 2015-01-09 MED ORDER — CEFAZOLIN SODIUM 1-5 GM-% IV SOLN
1.0000 g | Freq: Three times a day (TID) | INTRAVENOUS | Status: AC
  Administered 2015-01-09 – 2015-01-15 (×20): 1 g via INTRAVENOUS
  Filled 2015-01-09 (×20): qty 50

## 2015-01-09 NOTE — Consult Note (Signed)
Consult Reason for Consult: altered mental status, question seizure activity Referring Physician: Dr Tyson AliasFeinstein  CC: altered mental status  HPI: Kellie Velez is an 59 y.o. female with history of prior R MCA infarct, HTN admitted on 5/25 with altered mental status initially though likely due to hypernatremia and aspiration PNA. Intubated in ED for airway protection and [facility] to [facility] for suspected sepsis. On 5/26 noted to have fixed pupils with left upward gaze. Repeat head CT shows no acute process but suspected chronic R MCA infarct (imaging reviewed). Keppra 500mg  BID started at that time. Neurology consulted for question of recurrent seizures causing aspiration PNA.   EEG completed and consistent with a generalized encephalopathy. Labs pertinent for Na 140 (160 on admission 2 days ago), elevated BUN/Cr (trending down), WBC 17.7. Patient currently off sedation.   Past Medical History  Diagnosis Date  . Arthritis   . Hypertension   . Nerve damage   . Osteoporosis   . Hearing loss   . Abdominal pain   . Stroke     TIA's  . Bradycardia, sinus, persistent, severe   . 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 Bipolar 1 disorder   . Heart murmur 08/13/2012    EF 60-65%,severe LVH,Mild AOV regurg, MR,trivial TR  . Mild carotid artery disease 05/05/2005,08/13/2012    left ICA 0-49% diameter reduction 2006; bilateral fibrous plaque mild 2013  . LVH (left ventricular hypertrophy)     Past Surgical History  Procedure Laterality Date  . Cholecystectomy  1980  . Appendectomy  1983  . Total knee arthroplasty  2003    left  . Permanent pacemaker insertion  01/01/2005    Medtronic EnRhythm  . Permanent pacemaker generator change  06/08/2011    Medtronic Adapta  . Tee without cardioversion  01/06/2005    no cardiac source of embolus    No family history on file.  Social History:  reports that she has been smoking Cigarettes.   She has been smoking about 0.50 packs per day. She does not have any smokeless tobacco history on file. She reports that she does not drink alcohol or use illicit drugs.  Allergies  Allergen Reactions  . Floxin [Ofloxacin] Nausea And Vomiting and Rash  . Plavix [Clopidogrel Bisulfate]     "stomach pain"  . Epinephrine Other (See Comments)    "makes her feel excited"    Medications:  Scheduled: . antiseptic oral rinse  7 mL Mouth Rinse QID  . cefTRIAXone (ROCEPHIN)  IV  2 g Intravenous Q24H  . chlorhexidine  15 mL Mouth Rinse BID  . feeding supplement (PRO-STAT SUGAR FREE 64)  30 mL Per Tube BID  . heparin  5,000 Units Subcutaneous 3 times per day  . levETIRAcetam  500 mg Intravenous Q12H  . pantoprazole (PROTONIX) IV  40 mg Intravenous QHS  . potassium chloride  10 mEq Intravenous Q1 Hr x 6  . vancomycin  1,250 mg Intravenous Q24H    ROS: Out of a complete 14 system review, the patient complains of only the following symptoms, and all other reviewed systems are negative. Unable to assess due to mental status  Physical Examination: Filed Vitals:   01/09/15 1035  BP: 92/58  Pulse: 80  Temp:   Resp: 25   Physical Exam  Constitutional: He appears well-developed and well-nourished.  Psych: Affect appropriate to situation Eyes: No scleral injection HENT: No OP obstrucion Head: Normocephalic.  Cardiovascular: Normal rate and  regular rhythm.  Respiratory: coarse bilateral breath sounds GI: Soft. Bowel sounds are normal. No distension. There is no tenderness.  Skin: WDI  Neurologic Examination Mental Status: Intubated, off sedation, eyes closed, no spontaneous eye opening. Non-verbal, not following commands Cranial Nerves: II: unable to visualize optic discs, pupils equal, round, reactive to light  III,IV, VI: ptosis not present, eyes midline, will intermittently look to the left but not past midline to the right V,VII: face symmetric,unable to test LT VIII: hearing  normal bilaterally IX,X: gag reflex present Motor: Flaccid extremities, no spontaneous movement Sensory: No withdrawal to noxious stimuli Deep Tendon Reflexes: 1+ and symmetric throughout Plantars: Right: downgoing   Left: downgoing Cerebellar: Unable to test Gait: deferred  Laboratory Studies:   Basic Metabolic Panel:  Recent Labs Lab 01/07/15 1104 01/07/15 1433 01/08/15 0030 01/08/15 0610 01/09/15 0503  NA 160*  --  152* 150* 140  K 5.1  --  2.7* 3.2* 2.8*  CL 129*  --  128* 127* 120*  CO2 23  --  19* 19* 15*  GLUCOSE 145*  --  188* 161* 142*  BUN 45*  --  43* 40* 36*  CREATININE 2.42*  --  1.94* 1.79* 1.63*  CALCIUM 13.4* 12.4* 11.5* 11.3* 11.0*  MG  --   --   --  1.6*  --   PHOS  --  2.1*  --  1.0*  --     Liver Function Tests:  Recent Labs Lab 01/07/15 1104 01/09/15 0503  AST 22 40  ALT 11* 24  ALKPHOS 45 47  BILITOT 0.6 0.4  PROT 5.8* 4.4*  ALBUMIN 2.6* 1.7*   No results for input(s): LIPASE, AMYLASE in the last 168 hours. No results for input(s): AMMONIA in the last 168 hours.  CBC:  Recent Labs Lab 01/07/15 1104 01/08/15 0814 01/09/15 0503  WBC 23.1* 15.5* 17.7*  NEUTROABS 17.6*  --  14.5*  HGB 12.1 8.8* 9.2*  HCT 42.0 29.3* 30.0*  MCV 94.8 90.4 88.2  PLT 194 177 148*    Cardiac Enzymes: No results for input(s): CKTOTAL, CKMB, CKMBINDEX, TROPONINI in the last 168 hours.  BNP: Invalid input(s): POCBNP  CBG:  Recent Labs Lab 01/08/15 1619 01/08/15 1930 01/08/15 2332 01/09/15 0347 01/09/15 0759  GLUCAP 131* 132* 103* 120* 112*    Microbiology: Results for orders placed or performed during the hospital encounter of 01/07/15  Blood culture (routine x 2)     Status: None (Preliminary result)   Collection Time: 01/07/15 11:04 AM  Result Value Ref Range Status   Specimen Description BLOOD LEFT ARM  Final   Special Requests BOTTLES DRAWN AEROBIC AND ANAEROBIC 5CC  Final   Culture   Final           BLOOD CULTURE RECEIVED NO  GROWTH TO DATE CULTURE WILL BE HELD FOR 5 DAYS BEFORE ISSUING A FINAL NEGATIVE REPORT Performed at [facility]    Report Status PENDING  Incomplete  Blood culture (routine x 2)     Status: None (Preliminary result)   Collection Time: 01/07/15 11:23 AM  Result Value Ref Range Status   Specimen Description BLOOD LEFT ARM  Final   Special Requests   Final    BOTTLES DRAWN AEROBIC AND ANAEROBIC 10CCBLUE 5CCRED   Culture   Final           BLOOD CULTURE RECEIVED NO GROWTH TO DATE CULTURE WILL BE HELD FOR 5 DAYS BEFORE ISSUING A FINAL NEGATIVE REPORT Performed at [facility]    Report Status PENDING  Incomplete  Urine culture     Status: None   Collection Time: 01/07/15 11:30 AM  Result Value Ref Range Status   Specimen Description URINE, RANDOM  Final   Special Requests Normal  Final   Colony Count NO GROWTH Performed at [facility]   Final   Culture NO GROWTH Performed at [facility]   Final   Report Status 01/08/2015 FINAL  Final  Culture, respiratory (NON-Expectorated)     Status: None (Preliminary result)   Collection Time: 01/07/15  4:13 PM  Result Value Ref Range Status   Specimen Description TRACHEAL ASPIRATE  Final   Special Requests NONE  Final   Gram Stain   Final    ABUNDANT WBC PRESENT, PREDOMINANTLY PMN NO SQUAMOUS EPITHELIAL CELLS SEEN ABUNDANT GRAM POSITIVE COCCI IN CLUSTERS Performed at [facility]    Culture   Final    ABUNDANT STAPHYLOCOCCUS AUREUS Note: RIFAMPIN AND GENTAMICIN SHOULD NOT BE USED AS SINGLE DRUGS FOR TREATMENT OF STAPH INFECTIONS. Performed at [facility]    Report Status PENDING  Incomplete  MRSA PCR Screening     Status: None   Collection Time: 01/07/15  4:13 PM  Result Value Ref Range Status   MRSA by PCR NEGATIVE NEGATIVE Final    Comment:        The GeneXpert MRSA Assay (FDA approved for NASAL specimens only), is one component of a comprehensive MRSA colonization surveillance  program. It is not intended to diagnose MRSA infection nor to guide or monitor treatment for MRSA infections.     Coagulation Studies:  Recent Labs  01/07/15 1104 01/08/15 0610  LABPROT 25.2* 24.3*  INR 2.32* 2.21*    Urinalysis:  Recent Labs Lab 01/07/15 1120  COLORURINE YELLOW  LABSPEC 1.018  PHURINE 5.0  GLUCOSEU NEGATIVE  HGBUR NEGATIVE  BILIRUBINUR SMALL*  KETONESUR NEGATIVE  PROTEINUR 100*  UROBILINOGEN 0.2  NITRITE NEGATIVE  LEUKOCYTESUR TRACE*    Lipid Panel:  No results found for: CHOL, TRIG, HDL, CHOLHDL, VLDL, LDLCALC  HgbA1C: No results found for: HGBA1C  Urine Drug Screen:     Component Value Date/Time   LABOPIA NONE DETECTED 08/04/2014 1615   COCAINSCRNUR NONE DETECTED 08/04/2014 1615   LABBENZ NONE DETECTED 08/04/2014 1615   AMPHETMU NONE DETECTED 08/04/2014 1615   THCU NONE DETECTED 08/04/2014 1615   LABBARB NONE DETECTED 08/04/2014 1615    Alcohol Level: No results for input(s): ETH in the last 168 hours.   Imaging: Ct Head Wo Contrast  01/08/2015   CLINICAL DATA:  Unresponsive, pupil asymmetry, follow-up  EXAM: CT HEAD WITHOUT CONTRAST  TECHNIQUE: Contiguous axial images were obtained from the base of the skull through the vertex without intravenous contrast.  COMPARISON:  01/07/2015  FINDINGS: No evidence of parenchymal hemorrhage or extra-axial fluid collection. No mass lesion, mass effect, or midline shift.  No CT evidence of acute infarction.  Focal hypodensity within the right frontoparietal region (series 2/ image 21), favored to reflect encephalomalacic changes related to prior right MCA distribution infarct, likely chronic although possibly subacute. The appearance on the current CT is slightly unusual with apparent preservation of the overlying cortical rim, although this apparent difference may be simply technical/artifactual.  Subcortical white matter and periventricular small vessel ischemic changes. Mild cranial atherosclerosis.   Mild cortical atrophy.  No ventriculomegaly.  Partial opacification of the left maxillary sinus. Partial opacification of the bilateral mastoid air cells, left greater than  right.  No evidence of calvarial fracture.  IMPRESSION: Suspected prior right MCA distribution infarct, favored to be chronic, less likely subacute.  Otherwise, no evidence of acute intracranial abnormality.   Electronically Signed   By: Charline Bills M.D.   On: 01/08/2015 12:16   Ct Head Wo Contrast  01/07/2015   CLINICAL DATA:  Altered mental status.  Code sepsis.  EXAM: CT HEAD WITHOUT CONTRAST  TECHNIQUE: Contiguous axial images were obtained from the base of the skull through the vertex without intravenous contrast.  COMPARISON:  Head CT scan 07/20/2012.  FINDINGS: Since prior examination, the patient has suffered a right MCA infarct which appears remote. There is chronic microvascular ischemic change. No evidence of acute abnormality including infarct, hemorrhage, mass lesion, mass effect, midline shift or abnormal extra-axial fluid collections identified. No hydrocephalus or pneumocephalus. Mucous retention cyst or polyp left maxillary sinus is noted.  IMPRESSION: No acute abnormality.  Remote right MCA infarct.  Chronic microvascular ischemic change.   Electronically Signed   By: Drusilla Kanner M.D.   On: 01/07/2015 12:29   Dg Chest Port 1 View  01/09/2015   CLINICAL DATA:  Pneumonia.  EXAM: PORTABLE CHEST - 1 VIEW  COMPARISON:  01/08/2015.  FINDINGS: Endotracheal tube, NG tube, right IJ line in stable position. Mediastinum hilar structures are stable. Cardiac pacer lead tips in right atrium right ventricle. Heart size stable. The persistent unchanged prominent bibasilar pulmonary infiltrates consistent with pneumonia. No pleural effusion or pneumothorax.  IMPRESSION: 1.  Lines and tubes in stable position.  2. Persistent prominent bilateral lower lobe pulmonary infiltrates consistent with pneumonia.   Electronically Signed    By: Maisie Fus  Register   On: 01/09/2015 07:29   Dg Chest Port 1 View  01/08/2015   CLINICAL DATA:  Respiratory failure, sepsis, current tobacco use  EXAM: PORTABLE CHEST - 1 VIEW  COMPARISON:  Portable chest x-ray of Jan 07, 2015  FINDINGS: The lungs are adequately inflated. There is persistent interstitial and alveolar opacity in the right lower lobe. Persistent increased retrocardiac density is demonstrated but slight improved aeration present here. The cardiac silhouette is normal in size. The pulmonary vascularity is not engorged. The endotracheal tube tip lies 2.8 cm above the carina. The esophagogastric tube tip projects below the inferior margin of the image. The right internal jugular venous catheter tip projects over the midportion of the SVC. The permanent pacemaker is in reasonable position. The bony thorax exhibits no acute abnormalities.  IMPRESSION: Persistent bibasilar atelectasis and/or pneumonia. Slight interval improvement in the left retrocardiac region has occurred. The support tubes and lines are in reasonable position.   Electronically Signed   By: David  [place] M.D.   On: 01/08/2015 07:54   Dg Chest Port 1 View  01/07/2015   CLINICAL DATA:  Central line placement.  EXAM: PORTABLE CHEST - 1 VIEW  COMPARISON:  Same day.  FINDINGS: Stable cardiomediastinal silhouette. Endotracheal and nasogastric tubes are in grossly good position. Left-sided pacemaker is unchanged in position. Interval placement of right internal jugular catheter line is noted with distal tip in expected position of the cavoatrial junction. No pneumothorax is noted. Left lung is clear. Right basilar opacity is again noted concerning for pneumonia or atelectasis. No significant pleural effusion is noted.  IMPRESSION: Interval placement of right internal jugular catheter is noted with distal tip in expected position of cavoatrial junction. No pneumothorax is noted. Stable right basilar opacity is noted concerning for pneumonia  or atelectasis.   Electronically Signed  By: Lupita Raider, M.D.   On: 01/07/2015 19:01   Dg Chest Port 1 View  01/07/2015   CLINICAL DATA:  Respiratory failure endotracheal tube placement  EXAM: PORTABLE CHEST - 1 VIEW  COMPARISON:  08/04/2014  FINDINGS: Cardiomegaly again noted. Dual lead cardiac pacemaker is unchanged in position. Hazy right infrahilar atelectasis or infiltrate. There is NG tube in place. Endotracheal tube with tip at the level of carina towards right main bronchus. Endotracheal tube should be retracted about 1.5 cm. There is no pneumothorax.  IMPRESSION: Dual lead cardiac pacemaker is unchanged in position. Hazy right infrahilar atelectasis or infiltrate. There is NG tube in place. Endotracheal tube with tip at the level of carina towards right main bronchus. Endotracheal tube should be retracted about 1.5 cm. There is no pneumothorax.  These results were called by telephone at the time of interpretation on 01/07/2015 at 11:53 am to Dr. Geoffery Lyons , who verbally acknowledged these results.   Electronically Signed   By: Natasha Mead M.D.   On: 01/07/2015 11:53     Assessment/Plan:  59y/o woman with history of prior R MCA infarct, HTN, NH resident admitted on 5/25 with altered mental status initially though secondary to hypernatremia (Na 160) and sepsis from aspiration PNA. While in the hospital, noted to have period of fixed pupils and upward to the left gaze deviation. EEG completed shows generalized slowing but no seizure activity or focus. Started on keppra. Head CT shows likely chronic right MCA infarct.  Patient remains profoundly unresponsive, suspect this is likely multifactorial and related to metabolic abnormalities, underlying sepsis and possible seizure/post-ictal state. Suspect mental status will gradually improve with correction of underlying metabolic and infectious processes. Hold all sedating medications. Unclear what her baseline mental status is? Would continue  keppra  q12 at this time. Will adjust dose if further seizure activity noted. Will continue to follow.    Elspeth Cho, DO [facility] [phone]  If 7pm- 7am, please page neurology on call as listed in AMION. 01/09/2015, 10:48 AM

## 2015-01-09 NOTE — Progress Notes (Signed)
Patient placed back on full support due to sats dropping in the mid 80's. RN aware.

## 2015-01-09 NOTE — H&P (Signed)
PULMONARY / CRITICAL CARE MEDICINE   Name: Kellie SellsMary E Velez MRN: [id number] DOB: 03/10/1956    ADMISSION DATE:  01/07/2015 CONSULTATION DATE:  01/09/2015  REFERRING MD :  EDP  CHIEF COMPLAINT:  AMS  INITIAL PRESENTATION:  [name] [facility] with AMS likely due to hypernatremia as well as aspiration PNA.  In ED, intubated for GCS 3 and PCCM called for admission..    STUDIES:  CXR 5/25 >>> hazy right infrahilar atx or infiltrate. CT head 5/25 >>> no acute process.  Remote right MCA infarct.  Chronic microvascular ischemic changes. 5/26 - eeg>>>generalized non-specific cerebral dysfunction(encephalopathy). There was no seizure  Ct head #2>>>old subacute rt mca  SIGNIFICANT EVENTS: 5/25 - admitted with AMS, probable aspiration PNA, multiple metabolic derangements. 5/26- pupil change, to CT now, fevers, line was placed  SUBJECTIVE: not awake, off levo this am   VITAL SIGNS: Temp:  [97.5 F (36.4 C)-98.3 F (36.8 C)] 97.5 F (36.4 C) (05/27 0350) Pulse Rate:  [52-80] 70 (05/27 0800) Resp:  [15-24] 24 (05/27 0900) BP: (82-124)/(43-92) 85/51 mmHg (05/27 0900) SpO2:  [95 %-100 %] 100 % (05/27 0800) FiO2 (%):  [40 %] 40 % (05/27 0800) Weight:  [88.2 kg (194 lb 7.1 oz)] 88.2 kg (194 lb 7.1 oz) (05/27 0500) HEMODYNAMICS: CVP:  [6 mmHg-9 mmHg] 6 mmHg VENTILATOR SETTINGS: Vent Mode:  [-] PRVC FiO2 (%):  [40 %] 40 % Set Rate:  [24 bmp] 24 bmp Vt Set:  [530 mL] 530 mL PEEP:  [5 cmH20-10 cmH20] 5 cmH20 Plateau Pressure:  [20 cmH20-27 cmH20] 21 cmH20 INTAKE / OUTPUT: Intake/Output      05/26 0701 - 05/27 0700 05/27 0701 - 05/28 0700   I.V. (mL/kg) 2756.8 (31.3) 220 (2.5)   NG/GT 625 110   IV Piggyback 1005 305   Total Intake(mL/kg) 4386.8 (49.7) 635 (7.2)   Urine (mL/kg/hr) 1120 (0.5) 100 (0.3)   Total Output 1120 100   Net +3266.8 +535          PHYSICAL EXAMINATION: General: WDWN female, in NAD. Neuro: Sedated rass -3, grimaces to painful stimuli., pup rt 2, left 3   HEENT:line clean Cardiovascular: RRR, no M/R/G.  Lungs: ronchi unchanged Abdomen: BS x 4, soft, NT/ND.  Musculoskeletal: No gross deformities,  2 plus edema.  Skin: Multiple BUE skin excoriations noted, warm, no rashes.  LABS:  CBC  Recent Labs Lab 01/07/15 1104 01/08/15 0814 01/09/15 0503  WBC 23.1* 15.5* 17.7*  HGB 12.1 8.8* 9.2*  HCT 42.0 29.3* 30.0*  PLT 194 177 148*   Coag's  Recent Labs Lab 01/07/15 1104 01/08/15 0610  INR 2.32* 2.21*   BMET  Recent Labs Lab 01/08/15 0030 01/08/15 0610 01/09/15 0503  NA 152* 150* 140  K 2.7* 3.2* 2.8*  CL 128* 127* 120*  CO2 19* 19* 15*  BUN 43* 40* 36*  CREATININE 1.94* 1.79* 1.63*  GLUCOSE 188* 161* 142*   Electrolytes  Recent Labs Lab 01/07/15 1433 01/08/15 0030 01/08/15 0610 01/09/15 0503  CALCIUM 12.4* 11.5* 11.3* 11.0*  MG  --   --  1.6*  --   PHOS 2.1*  --  1.0*  --    Sepsis Markers  Recent Labs Lab 01/07/15 1125 01/07/15 1442 01/08/15 0653  LATICACIDVEN 2.15* 2.6* 0.98   ABG  Recent Labs Lab 01/07/15 1712 01/08/15 0547 01/09/15 0445  PHART 7.316* 7.340* 7.327*  PCO2ART 29.5* 26.7* 28.0*  PO2ART 105.0* 187.0* 205.0*   Liver Enzymes  Recent Labs  Lab 01/07/15 1104 01/09/15 0503  AST 22 40  ALT 11* 24  ALKPHOS 45 47  BILITOT 0.6 0.4  ALBUMIN 2.6* 1.7*   Cardiac Enzymes No results for input(s): TROPONINI, PROBNP in the last 168 hours. Glucose  Recent Labs Lab 01/08/15 1125 01/08/15 1619 01/08/15 1930 01/08/15 2332 01/09/15 0347 01/09/15 0759  GLUCAP 125* 131* 132* 103* 120* 112*    Imaging Dg Chest Port 1 View  01/09/2015   CLINICAL DATA:  Pneumonia.  EXAM: PORTABLE CHEST - 1 VIEW  COMPARISON:  01/08/2015.  FINDINGS: Endotracheal tube, NG tube, right IJ line in stable position. Mediastinum hilar structures are stable. Cardiac pacer lead tips in right atrium right ventricle. Heart size stable. The persistent unchanged prominent bibasilar pulmonary infiltrates  consistent with pneumonia. No pleural effusion or pneumothorax.  IMPRESSION: 1.  Lines and tubes in stable position.  2. Persistent prominent bilateral lower lobe pulmonary infiltrates consistent with pneumonia.   Electronically Signed   By: Maisie Fus  Register   On: 01/09/2015 07:29    ASSESSMENT / PLAN:  PULMONARY OETT 5/25 >>> A: VDRF due to inability to protect airway Probable aspiration PNA P:   abg reviewed, may need increase MV remain on peep 10 even though pao2 looked good Can reduce peep to 5 Wean cpap 5 ps 5, goal 2 hr Will need family discussion on reintubation wishes in future  INFECTIOUS A:   Septic shock P:   BCx2 5/25 > UCx 5/25 > Sputum Cx 5/25 >abundant staph aureus Abx: Vanc, start date 5/25>>> Abx: Cefepime, start date 5/25>>>5/27 Ceftriaxone 5/27>>>  Keep same abx follow staph sens pattern, Shortage cefepime and staph noted, change to ceftriaxone as will need more effective abx for mssa than vanc if mssa id noted  NEUROLOGIC A:   Acute metabolic encephalopathy Hx bipolar 1 disorder Pupil change minimal Old rt mca noted CT head neg acute x 2 concern seizure frmo old stroke source Post ictal? P:   Sedation:  Fentanyl PRN / Versed PRN. RASS goal: 0 Daily WUA. Hold outpatient benztropine, diazepam, fluphenazine, norco, meclizine, quetiapine, trazodone. Add home benzo to avoid wd - however cant confirm was on that agent Unable MRI with pacer Neuro consult Maintain keppra Was on lithium , level wnl, no lithium now  CARDIOVASCULAR A:  Septic shock Hx HTN, bradycardia s/p PPM, LVH P:  Off pressors Repeat LA resolved Simonne Come just turned off  RENAL A:   Hypernatremia - FWD of ~ 7L CKD IV Hypernatremia improved hypok P:   D5W @ 100cc/hr, change rate to 50  k supp Chem in am   GASTROINTESTINAL A:   GI prophylaxis Nutrition P:   SUP: Pantoprazole. Start TF   HEMATOLOGIC A:   VTE Prophylaxis Coagulopathy - supratherapeutic INR (dont see  coumadin on mar) r/o secondary to vti k def? P:  SCD's / Heparin. coags in am, may need further liver eval and/or mixing study  Repeat lft With cva would NOT correct inr at this stage  Need to call and confirm not on anticoagulant  ENDOCRINE A:   Hyperglycemia - no hx of DM P:   SSI if glucose consistently > 180.   Family updated:  Attempted to call sister Geoffery Spruce over the phone but no answer.  Interdisciplinary Family Meeting v Palliative Care Meeting:  Due by: 5/31.  Ccm time 30 min   Mcarthur Rossetti. Tyson Alias, MD, FACP Pgr: [phone] [facility]

## 2015-01-10 ENCOUNTER — Inpatient Hospital Stay (HOSPITAL_COMMUNITY): Payer: Medicare Other

## 2015-01-10 DIAGNOSIS — J9601 Acute respiratory failure with hypoxia: Secondary | ICD-10-CM | POA: Insufficient documentation

## 2015-01-10 DIAGNOSIS — G9340 Encephalopathy, unspecified: Secondary | ICD-10-CM

## 2015-01-10 DIAGNOSIS — E870 Hyperosmolality and hypernatremia: Secondary | ICD-10-CM | POA: Insufficient documentation

## 2015-01-10 DIAGNOSIS — J15211 Pneumonia due to Methicillin susceptible Staphylococcus aureus: Secondary | ICD-10-CM | POA: Insufficient documentation

## 2015-01-10 LAB — COMPREHENSIVE METABOLIC PANEL
ALBUMIN: 1.5 g/dL — AB (ref 3.5–5.0)
ALT: 36 U/L (ref 14–54)
AST: 53 U/L — ABNORMAL HIGH (ref 15–41)
Alkaline Phosphatase: 54 U/L (ref 38–126)
Anion gap: 7 (ref 5–15)
BUN: 37 mg/dL — ABNORMAL HIGH (ref 6–20)
CHLORIDE: 122 mmol/L — AB (ref 101–111)
CO2: 15 mmol/L — ABNORMAL LOW (ref 22–32)
Calcium: 11 mg/dL — ABNORMAL HIGH (ref 8.9–10.3)
Creatinine, Ser: 1.45 mg/dL — ABNORMAL HIGH (ref 0.44–1.00)
GFR calc Af Amer: 44 mL/min — ABNORMAL LOW (ref >60)
GFR calc non Af Amer: 38 mL/min — ABNORMAL LOW (ref >60)
Glucose, Bld: 118 mg/dL — ABNORMAL HIGH (ref 65–99)
Potassium: 3.1 mmol/L — ABNORMAL LOW (ref 3.5–5.1)
Sodium: 144 mmol/L (ref 135–145)
TOTAL PROTEIN: 4.3 g/dL — AB (ref 6.5–8.1)
Total Bilirubin: 0.2 mg/dL — ABNORMAL LOW (ref 0.3–1.2)

## 2015-01-10 LAB — PROTIME-INR
INR: 1.48 (ref 0.00–1.49)
PROTHROMBIN TIME: 18 s — AB (ref 11.6–15.2)

## 2015-01-10 LAB — CULTURE, RESPIRATORY W GRAM STAIN

## 2015-01-10 LAB — GLUCOSE, CAPILLARY
GLUCOSE-CAPILLARY: 99 mg/dL (ref 65–99)
Glucose-Capillary: 69 mg/dL (ref 65–99)
Glucose-Capillary: 77 mg/dL (ref 65–99)
Glucose-Capillary: 82 mg/dL (ref 65–99)
Glucose-Capillary: 86 mg/dL (ref 65–99)
Glucose-Capillary: 94 mg/dL (ref 65–99)

## 2015-01-10 LAB — CBC
HEMATOCRIT: 29.9 % — AB (ref 36.0–46.0)
Hemoglobin: 9 g/dL — ABNORMAL LOW (ref 12.0–15.0)
MCH: 26.9 pg (ref 26.0–34.0)
MCHC: 30.1 g/dL (ref 30.0–36.0)
MCV: 89.3 fL (ref 78.0–100.0)
Platelets: 132 10*3/uL — ABNORMAL LOW (ref 150–400)
RBC: 3.35 MIL/uL — AB (ref 3.87–5.11)
RDW: 20.2 % — ABNORMAL HIGH (ref 11.5–15.5)
WBC: 11.3 10*3/uL — ABNORMAL HIGH (ref 4.0–10.5)

## 2015-01-10 LAB — CULTURE, RESPIRATORY

## 2015-01-10 MED ORDER — FUROSEMIDE 10 MG/ML IJ SOLN
40.0000 mg | Freq: Once | INTRAMUSCULAR | Status: AC
  Administered 2015-01-10: 40 mg via INTRAVENOUS
  Filled 2015-01-10: qty 4

## 2015-01-10 MED ORDER — POTASSIUM CHLORIDE 20 MEQ/15ML (10%) PO SOLN
40.0000 meq | Freq: Once | ORAL | Status: AC
  Administered 2015-01-10: 40 meq
  Filled 2015-01-10 (×2): qty 30

## 2015-01-10 MED ORDER — POTASSIUM CHLORIDE 10 MEQ/50ML IV SOLN
10.0000 meq | INTRAVENOUS | Status: AC
  Administered 2015-01-10 (×2): 10 meq via INTRAVENOUS
  Filled 2015-01-10 (×2): qty 50

## 2015-01-10 NOTE — Progress Notes (Signed)
eLink Physician-Brief Progress Note Patient Name: Kellie SellsMary E Velez DOB: 06/24/1956 MRN: [id number]   Date of Service  01/10/2015  HPI/Events of Note    eICU Interventions  Hypokalemia -repleted      Intervention Category Intermediate Interventions: Electrolyte abnormality - evaluation and management  Roniel Halloran V. 01/10/2015, 5:16 AM

## 2015-01-10 NOTE — Progress Notes (Deleted)
[place] ICU Electrolyte Replacement Protocol  Patient Name: Kellie Velez DOB: 26-Nov-1955 MRN: [id number]  Date of Service  01/10/2015   HPI/Events of Note    Recent Labs Lab 01/07/15 1433 01/08/15 0030 01/08/15 0610 01/09/15 0503 01/09/15 1400 01/09/15 1700 01/10/15 0340  NA  --  152* 150* 140  --  146* 144  K  --  2.7* 3.2* 2.8*  --  3.5 3.1*  CL  --  128* 127* 120*  --  123* 122*  CO2  --  19* 19* 15*  --  16* 15*  GLUCOSE  --  188* 161* 142*  --  119* 118*  BUN  --  43* 40* 36*  --  36* 37*  CREATININE  --  1.94* 1.79* 1.63*  --  1.46* 1.45*  CALCIUM 12.4* 11.5* 11.3* 11.0*  --  11.3* 11.0*  MG  --   --  1.6*  --   --   --   --   PHOS 2.1*  --  1.0*  --  2.5  --   --     Estimated Creatinine Clearance: 48.8 mL/min (by C-G formula based on Cr of 1.45).  Intake/Output      05/27 0701 - 05/28 0700   I.V. (mL/kg) 1470 (16.2)   NG/GT 135   IV Piggyback 860   Total Intake(mL/kg) 2465 (27.2)   Urine (mL/kg/hr) 975 (0.4)   Total Output 975   Net +1490        - I/O DETAILED x24h    Total I/O In: 785 [I.V.:630; IV Piggyback:155] Out: 300 [Urine:300] - I/O THIS SHIFT    ASSESSMENT   eICURN Interventions  Electrolyte protocol criteria met.  Replace per protocol.  MD notified   ASSESSMENT: MAJOR ELECTROLYTE    Lorene Dy 01/10/2015, 5:10 AM

## 2015-01-10 NOTE — Progress Notes (Addendum)
PULMONARY / CRITICAL CARE MEDICINE   Name: Kellie Velez MRN: [id number] DOB: 06/25/1956    ADMISSION DATE:  01/07/2015 CONSULTATION DATE:  01/10/2015  REFERRING MD :  EDP  CHIEF COMPLAINT:  AMS  INITIAL PRESENTATION:  [name] [facility] with AMS likely due to hypernatremia as well as aspiration PNA.  In ED, intubated for GCS 3 and PCCM called for admission.  STUDIES:  CXR 5/25 >>> hazy right infrahilar atx or infiltrate. CT head 5/25 >>> no acute process.  Remote right MCA infarct.  Chronic microvascular ischemic changes. 5/26 - eeg>>>generalized non-specific cerebral dysfunction(encephalopathy). There was no seizure  Ct head #2>>>old subacute rt mca  SIGNIFICANT EVENTS: 5/25 - admitted with AMS, probable aspiration PNA, multiple metabolic derangements. 5/26- pupil change, to CT now, fevers, line was placed  SUBJECTIVE: failed SBT, not following commands, high gastric residuals  VITAL SIGNS: Temp:  [98.2 F (36.8 C)-98.9 F (37.2 C)] 98.9 F (37.2 C) (05/28 1149) Pulse Rate:  [26-87] 69 (05/28 1400) Resp:  [17-28] 27 (05/28 1400) BP: (69-119)/(40-68) 105/56 mmHg (05/28 1400) SpO2:  [82 %-100 %] 100 % (05/28 1400) FiO2 (%):  [40 %] 40 % (05/28 1400) Weight:  [90.5 kg (199 lb 8.3 oz)] 90.5 kg (199 lb 8.3 oz) (05/28 0353) HEMODYNAMICS: CVP:  [4 mmHg-9 mmHg] 6 mmHg VENTILATOR SETTINGS: Vent Mode:  [-] PRVC FiO2 (%):  [40 %] 40 % Set Rate:  [24 bmp] 24 bmp Vt Set:  [530 mL] 530 mL PEEP:  [5 cmH20] 5 cmH20 Plateau Pressure:  [0 cmH20-22 cmH20] 0 cmH20 INTAKE / OUTPUT: Intake/Output      05/27 0701 - 05/28 0700 05/28 0701 - 05/29 0700   I.V. (mL/kg) 1680 (18.6) 490 (5.4)   NG/GT 310 55   IV Piggyback 1010 455   Total Intake(mL/kg) 3000 (33.1) 1000 (11)   Urine (mL/kg/hr) 1395 (0.6) 525 (0.8)   Stool  0 (0)   Total Output 1395 525   Net +1605 +475        Stool Occurrence  2 x     PHYSICAL EXAMINATION:  Gen; Chronically ill appearing, on vent HENT:  NCAT ETT in place PULM: few crackles in bases CV: RRR, no mgr GI: BS+, soft, nontender MSK: normal bulk, tone Derm: edema/anasarca all over Neuro minimal response to external stimuli  LABS:  CBC  Recent Labs Lab 01/08/15 0814 01/09/15 0503 01/10/15 0340  WBC 15.5* 17.7* 11.3*  HGB 8.8* 9.2* 9.0*  HCT 29.3* 30.0* 29.9*  PLT 177 148* 132*   Coag's  Recent Labs Lab 01/07/15 1104 01/08/15 0610 01/10/15 0340  INR 2.32* 2.21* 1.48   BMET  Recent Labs Lab 01/09/15 0503 01/09/15 1700 01/10/15 0340  NA 140 146* 144  K 2.8* 3.5 3.1*  CL 120* 123* 122*  CO2 15* 16* 15*  BUN 36* 36* 37*  CREATININE 1.63* 1.46* 1.45*  GLUCOSE 142* 119* 118*   Electrolytes  Recent Labs Lab 01/07/15 1433  01/08/15 0610 01/09/15 0503 01/09/15 1400 01/09/15 1700 01/10/15 0340  CALCIUM 12.4*  < > 11.3* 11.0*  --  11.3* 11.0*  MG  --   --  1.6*  --   --   --   --   PHOS 2.1*  --  1.0*  --  2.5  --   --   < > = values in this interval not displayed. Sepsis Markers  Recent Labs Lab 01/07/15 1125 01/07/15 1442 01/08/15 0653  LATICACIDVEN 2.15* 2.6* 0.98  ABG  Recent Labs Lab 01/07/15 1712 01/08/15 0547 01/09/15 0445  PHART 7.316* 7.340* 7.327*  PCO2ART 29.5* 26.7* 28.0*  PO2ART 105.0* 187.0* 205.0*   Liver Enzymes  Recent Labs Lab 01/07/15 1104 01/09/15 0503 01/10/15 0340  AST 22 40 53*  ALT 11* 24 36  ALKPHOS 45 47 54  BILITOT 0.6 0.4 0.2*  ALBUMIN 2.6* 1.7* 1.5*   Cardiac Enzymes No results for input(s): TROPONINI, PROBNP in the last 168 hours. Glucose  Recent Labs Lab 01/09/15 1946 01/10/15 0015 01/10/15 0332 01/10/15 0650 01/10/15 0816 01/10/15 1150  GLUCAP 104* 77 69 94 99 82    Imaging 5/28 CXR > bilateral mild airspace disease in bases, pacer, ETT poorly visualized  ASSESSMENT / PLAN:  PULMONARY OETT 5/25 >>> A: VDRF due to inability to protect airway, pneumonia, ?pulm edema MSSA pneumonia P:   SBT daily, goal PSV 1-2  hours Diurese today Daily CXR VAP bundle See ID  INFECTIOUS A:   Septic shock > resolved MSSA pneumonia P:   BCx2 5/25 > UCx 5/25 > Sputum Cx 5/25 >abundant staph aureus   Vanc, start date 5/25>>> Cefepime, start date 5/25>>>5/27 Ceftriaxone 5/27>>>   NEUROLOGIC A:   Acute metabolic encephalopathy > persistent 5/28, likely multifactorial from sepsis, hypernatremia Hx bipolar 1 disorder Hx of CVA CT head neg acute x 2 concern seizure frmo old stroke source Post ictal P:   Sedation:  Fentanyl PRN  D/C  Versed RASS goal: 0 Daily WUA. Continue to hold outpatient benztropine, diazepam, fluphenazine, norco, meclizine, quetiapine, trazodone. Maintain keppra   CARDIOVASCULAR A:  Septic shock > resolved Hx HTN, bradycardia s/p PPM, LVH P:  Monitor BP  RENAL A:   CKD IV Hypernatremia improved Hypokalemia P:   Continue D5 at 50cc/hr Monitor BMET and UOP Replace electrolytes as needed   GASTROINTESTINAL A:   GI prophylaxis Nutrition P:   SUP: Pantoprazole. Start TF   HEMATOLOGIC A:   VTE Prophylaxis Coagulopathy > uncertain etiology, improving P:  Repeat VitK  ENDOCRINE A:   Hyperglycemia - no hx of DM P:   SSI if glucose consistently > 180.   Family updated:  None at bedside  Interdisciplinary Family Meeting v Palliative Care Meeting:  Due by: 5/31.  CC time 40 minutes  Heber [place], MD [facility] Pager: [phone] Cell: [phone] After 3pm or if no response, call [phone]

## 2015-01-11 ENCOUNTER — Inpatient Hospital Stay (HOSPITAL_COMMUNITY): Payer: Medicare Other

## 2015-01-11 DIAGNOSIS — J810 Acute pulmonary edema: Secondary | ICD-10-CM

## 2015-01-11 LAB — BASIC METABOLIC PANEL
Anion gap: 5 (ref 5–15)
BUN: 42 mg/dL — ABNORMAL HIGH (ref 6–20)
CALCIUM: 11 mg/dL — AB (ref 8.9–10.3)
CO2: 16 mmol/L — AB (ref 22–32)
Chloride: 124 mmol/L — ABNORMAL HIGH (ref 101–111)
Creatinine, Ser: 1.35 mg/dL — ABNORMAL HIGH (ref 0.44–1.00)
GFR, EST AFRICAN AMERICAN: 48 mL/min — AB (ref >60)
GFR, EST NON AFRICAN AMERICAN: 41 mL/min — AB (ref >60)
Glucose, Bld: 117 mg/dL — ABNORMAL HIGH (ref 65–99)
POTASSIUM: 3.4 mmol/L — AB (ref 3.5–5.1)
SODIUM: 145 mmol/L (ref 135–145)

## 2015-01-11 LAB — CBC WITH DIFFERENTIAL/PLATELET
Basophils Absolute: 0 10*3/uL (ref 0.0–0.1)
Basophils Relative: 0 % (ref 0–1)
Eosinophils Absolute: 0.2 10*3/uL (ref 0.0–0.7)
Eosinophils Relative: 2 % (ref 0–5)
HEMATOCRIT: 27.4 % — AB (ref 36.0–46.0)
Hemoglobin: 8.4 g/dL — ABNORMAL LOW (ref 12.0–15.0)
LYMPHS ABS: 1.1 10*3/uL (ref 0.7–4.0)
Lymphocytes Relative: 11 % — ABNORMAL LOW (ref 12–46)
MCH: 27.1 pg (ref 26.0–34.0)
MCHC: 30.7 g/dL (ref 30.0–36.0)
MCV: 88.4 fL (ref 78.0–100.0)
MONOS PCT: 7 % (ref 3–12)
Monocytes Absolute: 0.7 10*3/uL (ref 0.1–1.0)
Neutro Abs: 8.1 10*3/uL — ABNORMAL HIGH (ref 1.7–7.7)
Neutrophils Relative %: 80 % — ABNORMAL HIGH (ref 43–77)
PLATELETS: 110 10*3/uL — AB (ref 150–400)
RBC: 3.1 MIL/uL — ABNORMAL LOW (ref 3.87–5.11)
RDW: 20.3 % — ABNORMAL HIGH (ref 11.5–15.5)
WBC: 10.1 10*3/uL (ref 4.0–10.5)

## 2015-01-11 LAB — GLUCOSE, CAPILLARY
GLUCOSE-CAPILLARY: 73 mg/dL (ref 65–99)
GLUCOSE-CAPILLARY: 94 mg/dL (ref 65–99)
Glucose-Capillary: 99 mg/dL (ref 65–99)
Glucose-Capillary: 111 mg/dL — ABNORMAL HIGH (ref 65–99)
Glucose-Capillary: 93 mg/dL (ref 65–99)

## 2015-01-11 LAB — CLOSTRIDIUM DIFFICILE BY PCR: Toxigenic C. Difficile by PCR: POSITIVE — AB

## 2015-01-11 LAB — PROTIME-INR
INR: 1.39 (ref 0.00–1.49)
Prothrombin Time: 17.2 seconds — ABNORMAL HIGH (ref 11.6–15.2)

## 2015-01-11 MED ORDER — METRONIDAZOLE 50 MG/ML ORAL SUSPENSION
500.0000 mg | Freq: Three times a day (TID) | ORAL | Status: DC
  Filled 2015-01-11 (×2): qty 10

## 2015-01-11 MED ORDER — FUROSEMIDE 10 MG/ML IJ SOLN
40.0000 mg | Freq: Four times a day (QID) | INTRAMUSCULAR | Status: AC
  Administered 2015-01-11 (×2): 40 mg via INTRAVENOUS
  Filled 2015-01-11 (×2): qty 4

## 2015-01-11 MED ORDER — POTASSIUM CHLORIDE 20 MEQ/15ML (10%) PO SOLN
20.0000 meq | ORAL | Status: AC
  Administered 2015-01-11 (×2): 20 meq
  Filled 2015-01-11 (×2): qty 15

## 2015-01-11 MED ORDER — POTASSIUM CHLORIDE 20 MEQ/15ML (10%) PO SOLN
40.0000 meq | Freq: Two times a day (BID) | ORAL | Status: AC
  Administered 2015-01-11 (×2): 40 meq
  Filled 2015-01-11 (×2): qty 30

## 2015-01-11 MED ORDER — METRONIDAZOLE 500 MG PO TABS
500.0000 mg | ORAL_TABLET | Freq: Three times a day (TID) | ORAL | Status: DC
  Administered 2015-01-11 – 2015-01-12 (×2): 500 mg
  Filled 2015-01-11 (×5): qty 1

## 2015-01-11 NOTE — Progress Notes (Signed)
eLink Physician-Brief Progress Note Patient Name: Nita SellsMary E Meharg DOB: 12/06/1955 MRN: [id number]   Date of Service  01/11/2015  HPI/Events of Note  Stool C. Difficile Toxin positive. Patient is already in contact isolation.   eICU Interventions  Will order Flagyl Solution 500 mg via gastric tube Q 8 hours.      Intervention Category Intermediate Interventions: Diagnostic test evaluation;Infection - evaluation and management  Sommer,Steven Dennard Nipugene 01/11/2015, 6:26 PM

## 2015-01-11 NOTE — Progress Notes (Signed)
CRITICAL VALUE ALERT  Critical value received:  Positive C-diff  Dr. Arsenio LoaderSommer notified. New order placed

## 2015-01-11 NOTE — Progress Notes (Signed)
PULMONARY / CRITICAL CARE MEDICINE   Name: Kellie SellsMary E Furber MRN: [id number] DOB: 01/30/1956    ADMISSION DATE:  01/07/2015 CONSULTATION DATE:  01/11/2015  REFERRING MD :  EDP  CHIEF COMPLAINT:  AMS  INITIAL PRESENTATION:  [name] [facility] with AMS likely due to hypernatremia as well as aspiration PNA.  In ED, intubated for GCS 3 and PCCM called for admission.  STUDIES:  CXR 5/25 >>> hazy right infrahilar atx or infiltrate. CT head 5/25 >>> no acute process.  Remote right MCA infarct.  Chronic microvascular ischemic changes. 5/26 - eeg>>>generalized non-specific cerebral dysfunction(encephalopathy). There was no seizure  Ct head #2>>>old subacute rt mca  SIGNIFICANT EVENTS: 5/25 - admitted with AMS, probable aspiration PNA, multiple metabolic derangements. 5/26- pupil change, to CT now, fevers, line was placed  SUBJECTIVE: PSV this morning for 4 hours, still not waking up much  VITAL SIGNS: Temp:  [97.5 F (36.4 C)-99.4 F (37.4 C)] 98.6 F (37 C) (05/29 1228) Pulse Rate:  [68-78] 72 (05/29 1300) Resp:  [20-33] 27 (05/29 1300) BP: (82-117)/(40-65) 105/47 mmHg (05/29 1300) SpO2:  [94 %-100 %] 100 % (05/29 1300) FiO2 (%):  [30 %-40 %] 30 % (05/29 1203) Weight:  [88.8 kg (195 lb 12.3 oz)] 88.8 kg (195 lb 12.3 oz) (05/29 0400) HEMODYNAMICS: CVP:  [2 mmHg-9 mmHg] 3 mmHg VENTILATOR SETTINGS: Vent Mode:  [-] CPAP;PSV FiO2 (%):  [30 %-40 %] 30 % Set Rate:  [24 bmp] 24 bmp Vt Set:  [530 mL] 530 mL PEEP:  [5 cmH20] 5 cmH20 Pressure Support:  [10 cmH20] 10 cmH20 Plateau Pressure:  [17 cmH20-20 cmH20] 18 cmH20 INTAKE / OUTPUT: Intake/Output      05/28 0701 - 05/29 0700 05/29 0701 - 05/30 0700   I.V. (mL/kg) 1680 (18.9) 420 (4.7)   NG/GT 438.3 120   IV Piggyback 660 105   Total Intake(mL/kg) 2778.3 (31.3) 645 (7.3)   Urine (mL/kg/hr) 3175 (1.5) 275 (0.4)   Stool 0 (0)    Total Output 3175 275   Net -396.7 +370        Stool Occurrence 2 x      PHYSICAL  EXAMINATION:  Gen; Chronically ill appearing, on vent HENT: NCAT ETT in place PULM: few crackles in bases CV: RRR, no mgr GI: BS+, soft, nontender MSK: normal bulk, tone Derm: edema/anasarca all over Neuro minimal response to external stimuli  LABS:  CBC  Recent Labs Lab 01/09/15 0503 01/10/15 0340 01/11/15 0305  WBC 17.7* 11.3* 10.1  HGB 9.2* 9.0* 8.4*  HCT 30.0* 29.9* 27.4*  PLT 148* 132* 110*   Coag's  Recent Labs Lab 01/08/15 0610 01/10/15 0340 01/11/15 0305  INR 2.21* 1.48 1.39   BMET  Recent Labs Lab 01/09/15 1700 01/10/15 0340 01/11/15 0305  NA 146* 144 145  K 3.5 3.1* 3.4*  CL 123* 122* 124*  CO2 16* 15* 16*  BUN 36* 37* 42*  CREATININE 1.46* 1.45* 1.35*  GLUCOSE 119* 118* 117*   Electrolytes  Recent Labs Lab 01/07/15 1433  01/08/15 0610  01/09/15 1400 01/09/15 1700 01/10/15 0340 01/11/15 0305  CALCIUM 12.4*  < > 11.3*  < >  --  11.3* 11.0* 11.0*  MG  --   --  1.6*  --   --   --   --   --   PHOS 2.1*  --  1.0*  --  2.5  --   --   --   < > = values  in this interval not displayed. Sepsis Markers  Recent Labs Lab 01/07/15 1125 01/07/15 1442 01/08/15 0653  LATICACIDVEN 2.15* 2.6* 0.98   ABG  Recent Labs Lab 01/07/15 1712 01/08/15 0547 01/09/15 0445  PHART 7.316* 7.340* 7.327*  PCO2ART 29.5* 26.7* 28.0*  PO2ART 105.0* 187.0* 205.0*   Liver Enzymes  Recent Labs Lab 01/07/15 1104 01/09/15 0503 01/10/15 0340  AST 22 40 53*  ALT 11* 24 36  ALKPHOS 45 47 54  BILITOT 0.6 0.4 0.2*  ALBUMIN 2.6* 1.7* 1.5*   Cardiac Enzymes No results for input(s): TROPONINI, PROBNP in the last 168 hours. Glucose  Recent Labs Lab 01/10/15 0816 01/10/15 1150 01/10/15 1607 01/11/15 0303 01/11/15 0818 01/11/15 1230  GLUCAP 99 82 86 99 73 94    Imaging 5/28 CXR > bilateral mild airspace disease in bases, pacer, ETT poorly visualized  ASSESSMENT / PLAN:  PULMONARY OETT 5/25 >>> A: VDRF due to inability to protect airway,  pneumonia, acute pulm edema MSSA pneumonia Mental status limiting extubation P:   SBT daily, back to full support now Diurese today again Daily CXR VAP bundle See ID  INFECTIOUS A:   Septic shock > resolved MSSA pneumonia P:   BCx2 5/25 > UCx 5/25 > Sputum Cx 5/25 >MSSA   Vanc, start date 5/25>>> 5/29 Cefepime, start date 5/25>>>5/27 Cefazolin 5/27>>>   NEUROLOGIC A:   Acute metabolic encephalopathy > persistent 5/29, likely multifactorial from sepsis, hypernatremia Hx bipolar 1 disorder Hx of CVA CT head neg acute x 2 P:   Sedation:  Fentanyl PRN  D/C  Versed RASS goal: 0 Daily WUA. Continue to hold outpatient benztropine, diazepam, fluphenazine, norco, meclizine, quetiapine, trazodone Maintain keppra   CARDIOVASCULAR A:  Septic shock > resolved Hx HTN, bradycardia s/p PPM, LVH P:  Monitor BP   RENAL A:   CKD IV Hypernatremia improved Hypokalemia P:   Continue D5 at 50cc/hr Monitor BMET and UOP Replace electrolytes as needed Diurese today again, give extra potassium+  GASTROINTESTINAL A:   GI prophylaxis Nutrition P:   SUP: Pantoprazole Continue TF   HEMATOLOGIC A:   VTE Prophylaxis Coagulopathy > uncertain etiology, improving P:  Monitor for bleeding  ENDOCRINE A:   Hyperglycemia - no hx of DM P:   SSI if glucose consistently > 180   Family updated:  None at bedside  Interdisciplinary Family Meeting v Palliative Care Meeting:  Due by: 5/31.  CC time 35 minutes  Heber [place], MD [place] P[facility]M Pager: [phone] Cell: [phone] After 3pm or if no response, call [phone]

## 2015-01-11 NOTE — Progress Notes (Signed)
Subjective: No overnight events. No further seizure activity noted.   Objective: Current vital signs: BP 100/50 mmHg  Pulse 70  Temp(Src) 97.9 F (36.6 C) (Oral)  Resp 26  Ht 5\' 9"  (1.753 m)  Wt 88.8 kg (195 lb 12.3 oz)  BMI 28.90 kg/m2  SpO2 99% Vital signs in last 24 hours: Temp:  [97.5 F (36.4 C)-99.3 F (37.4 C)] 97.9 F (36.6 C) (05/29 0300) Pulse Rate:  [68-87] 70 (05/29 0700) Resp:  [22-29] 26 (05/29 0700) BP: (82-117)/(40-62) 100/50 mmHg (05/29 0700) SpO2:  [96 %-100 %] 99 % (05/29 0700) FiO2 (%):  [30 %-40 %] 30 % (05/29 0600) Weight:  [88.8 kg (195 lb 12.3 oz)] 88.8 kg (195 lb 12.3 oz) (05/29 0400)  Intake/Output from previous day: 05/28 0701 - 05/29 0700 In: 2778.3 [I.V.:1680; NG/GT:438.3; IV Piggyback:660] Out: 3175 [Urine:3175] Intake/Output this shift:   Nutritional status:    Neurologic Exam: Mental Status: Intubated, off sedation, eyes closed, no spontaneous eye opening. Non-verbal, not following commands Cranial Nerves: II:  pupils equal, round, reactive to light  III,IV, VI: will intermittently look to the left but not past midline to the right V,VII: face symmetric,unable to test LT IX,X: gag reflex present Motor: Flaccid extremities, no spontaneous movement Sensory: No withdrawal to noxious stimuli Deep Tendon Reflexes: 1+ and symmetric throughout Plantars: Right: downgoingLeft: downgoing Cerebellar:  Lab Results: Basic Metabolic Panel:  Recent Labs Lab 01/07/15 1433  01/08/15 0610 01/09/15 0503 01/09/15 1400 01/09/15 1700 01/10/15 0340 01/11/15 0305  NA  --   < > 150* 140  --  146* 144 145  K  --   < > 3.2* 2.8*  --  3.5 3.1* 3.4*  CL  --   < > 127* 120*  --  123* 122* 124*  CO2  --   < > 19* 15*  --  16* 15* 16*  GLUCOSE  --   < > 161* 142*  --  119* 118* 117*  BUN  --   < > 40* 36*  --  36* 37* 42*  CREATININE  --   < > 1.79* 1.63*  --  1.46* 1.45* 1.35*  CALCIUM 12.4*  < > 11.3* 11.0*  --   11.3* 11.0* 11.0*  MG  --   --  1.6*  --   --   --   --   --   PHOS 2.1*  --  1.0*  --  2.5  --   --   --   < > = values in this interval not displayed.  Liver Function Tests:  Recent Labs Lab 01/07/15 1104 01/09/15 0503 01/10/15 0340  AST 22 40 53*  ALT 11* 24 36  ALKPHOS 45 47 54  BILITOT 0.6 0.4 0.2*  PROT 5.8* 4.4* 4.3*  ALBUMIN 2.6* 1.7* 1.5*   No results for input(s): LIPASE, AMYLASE in the last 168 hours. No results for input(s): AMMONIA in the last 168 hours.  CBC:  Recent Labs Lab 01/07/15 1104 01/08/15 0814 01/09/15 0503 01/10/15 0340 01/11/15 0305  WBC 23.1* 15.5* 17.7* 11.3* 10.1  NEUTROABS 17.6*  --  14.5*  --  8.1*  HGB 12.1 8.8* 9.2* 9.0* 8.4*  HCT 42.0 29.3* 30.0* 29.9* 27.4*  MCV 94.8 90.4 88.2 89.3 88.4  PLT 194 177 148* 132* 110*    Cardiac Enzymes: No results for input(s): CKTOTAL, CKMB, CKMBINDEX, TROPONINI in the last 168 hours.  Lipid Panel: No results for input(s): CHOL, TRIG, HDL, CHOLHDL, VLDL, LDLCALC in  the last 168 hours.  CBG:  Recent Labs Lab 01/10/15 0650 01/10/15 0816 01/10/15 1150 01/10/15 1607 01/11/15 0303  GLUCAP 94 99 82 86 99    Microbiology: Results for orders placed or performed during the hospital encounter of 01/07/15  Blood culture (routine x 2)     Status: None (Preliminary result)   Collection Time: 01/07/15 11:04 AM  Result Value Ref Range Status   Specimen Description BLOOD LEFT ARM  Final   Special Requests BOTTLES DRAWN AEROBIC AND ANAEROBIC 5CC  Final   Culture   Final           BLOOD CULTURE RECEIVED NO GROWTH TO DATE CULTURE WILL BE HELD FOR 5 DAYS BEFORE ISSUING A FINAL NEGATIVE REPORT Performed at [facility]    Report Status PENDING  Incomplete  Blood culture (routine x 2)     Status: None (Preliminary result)   Collection Time: 01/07/15 11:23 AM  Result Value Ref Range Status   Specimen Description BLOOD LEFT ARM  Final   Special Requests   Final    BOTTLES DRAWN AEROBIC AND  ANAEROBIC 10CCBLUE 5CCRED   Culture   Final           BLOOD CULTURE RECEIVED NO GROWTH TO DATE CULTURE WILL BE HELD FOR 5 DAYS BEFORE ISSUING A FINAL NEGATIVE REPORT Performed at [facility]    Report Status PENDING  Incomplete  Urine culture     Status: None   Collection Time: 01/07/15 11:30 AM  Result Value Ref Range Status   Specimen Description URINE, RANDOM  Final   Special Requests Normal  Final   Colony Count NO GROWTH Performed at [facility]   Final   Culture NO GROWTH Performed at [facility]   Final   Report Status 01/08/2015 FINAL  Final  Culture, respiratory (NON-Expectorated)     Status: None   Collection Time: 01/07/15  4:13 PM  Result Value Ref Range Status   Specimen Description TRACHEAL ASPIRATE  Final   Special Requests NONE  Final   Gram Stain   Final    ABUNDANT WBC PRESENT, PREDOMINANTLY PMN NO SQUAMOUS EPITHELIAL CELLS SEEN ABUNDANT GRAM POSITIVE COCCI IN CLUSTERS Performed at [facility]    Culture   Final    ABUNDANT STAPHYLOCOCCUS AUREUS Note: RIFAMPIN AND GENTAMICIN SHOULD NOT BE USED AS SINGLE DRUGS FOR TREATMENT OF STAPH INFECTIONS. Performed at [facility]    Report Status 01/10/2015 FINAL  Final   Organism ID, Bacteria STAPHYLOCOCCUS AUREUS  Final      Susceptibility   Staphylococcus aureus - MIC*    CLINDAMYCIN <=0.25 SENSITIVE Sensitive     ERYTHROMYCIN <=0.25 SENSITIVE Sensitive     GENTAMICIN <=0.5 SENSITIVE Sensitive     LEVOFLOXACIN 0.25 SENSITIVE Sensitive     OXACILLIN <=0.25 SENSITIVE Sensitive     PENICILLIN 0.06 SENSITIVE Sensitive     RIFAMPIN <=0.5 SENSITIVE Sensitive     TRIMETH/SULFA <=10 SENSITIVE Sensitive     VANCOMYCIN 1 SENSITIVE Sensitive     TETRACYCLINE <=1 SENSITIVE Sensitive     MOXIFLOXACIN <=0.25 SENSITIVE Sensitive     * ABUNDANT STAPHYLOCOCCUS AUREUS  MRSA PCR Screening     Status: None   Collection Time: 01/07/15  4:13 PM  Result Value Ref Range Status   MRSA  by PCR NEGATIVE NEGATIVE Final    Comment:        The GeneXpert MRSA Assay (FDA approved for NASAL specimens only), is one  component of a comprehensive MRSA colonization surveillance program. It is not intended to diagnose MRSA infection nor to guide or monitor treatment for MRSA infections.     Coagulation Studies:  Recent Labs  01/10/15 0340 01/11/15 0305  LABPROT 18.0* 17.2*  INR 1.48 1.39    Imaging: Dg Chest Port 1 View  01/11/2015   CLINICAL DATA:  Acute respiratory failure  EXAM: PORTABLE CHEST - 1 VIEW  COMPARISON:  01/10/2015  FINDINGS: Bibasilar airspace opacities are stable. There are no new lung abnormalities.  No pneumothorax.  Probable small effusions.  Endotracheal tube tip is not well defined on this exam, but appears to project approximate 5 mm above the carina.  Orogastric tube and right internal jugular central venous line are stable and well positioned.  IMPRESSION: 1. No change from the previous day's study. 2. Persistent basilar airspace opacity which may reflect pneumonia, atelectasis or a combination. No evidence of pulmonary edema. 3. Support apparatus stable position.   Electronically Signed   By: Amie Portland M.D.   On: 01/11/2015 07:13   Dg Chest Port 1 View  01/10/2015   CLINICAL DATA:  Acute respiratory failure.  EXAM: PORTABLE CHEST - 1 VIEW  COMPARISON:  01/09/2015  FINDINGS: Nasogastric tube tip is in the stomach. The right IJ catheter tip is in the projection of the cavoatrial junction. Left chest wall ICD is noted with leads in the right atrial appendage and right ventricle. No significant change in bilateral lower lobe airspace opacities.  IMPRESSION: 1. No change in bilateral lower lobe airspace opacities. 2. Stable support apparatus.   Electronically Signed   By: Signa Kell M.D.   On: 01/10/2015 09:20   Dg Abd Portable 1v  01/10/2015   CLINICAL DATA:  Orogastric tube placement.  EXAM: PORTABLE ABDOMEN - 1 VIEW  COMPARISON:  Chest x-ray today.   FINDINGS: Enteric tube is present which has tip right of midline in the upper abdomen likely over the distal stomach/ gastroduodenal junction. Bowel gas pattern is nonobstructive. IVC filter is present with tip just left of midline at the L2 level as patient is slightly rotated to the left. There are mild degenerative changes of the spine. There are several pelvic phleboliths. A small caliber rectal catheter is present.  IMPRESSION: Nonobstructive bowel gas pattern.  Enteric tube with tip right of midline over the upper abdomen likely over the distal stomach or gastroduodenal junction.   Electronically Signed   By: Elberta Fortis M.D.   On: 01/10/2015 12:12    Medications:  Scheduled: . antiseptic oral rinse  7 mL Mouth Rinse QID  .  ceFAZolin (ANCEF) IV  1 g Intravenous 3 times per day  . chlorhexidine  15 mL Mouth Rinse BID  . feeding supplement (PRO-STAT SUGAR FREE 64)  30 mL Per Tube BID  . heparin  5,000 Units Subcutaneous 3 times per day  . levETIRAcetam  500 mg Intravenous Q12H  . metoCLOPramide (REGLAN) injection  5 mg Intravenous Q12H  . pantoprazole (PROTONIX) IV  40 mg Intravenous QHS  . potassium chloride  20 mEq Per Tube Q4H  . vancomycin  1,250 mg Intravenous Q24H    Assessment/Plan:  59y/o woman with history of prior R MCA infarct, HTN, [facility] resident admitted on 5/25 with altered mental status initially though secondary to hypernatremia (Na 160) and sepsis from aspiration PNA. While in the hospital, noted to have period of fixed pupils and upward to the left gaze deviation concerning for seizure activity. EEG completed  shows generalized slowing but no seizure activity or focus. Started on keppra. Head CT shows likely chronic right MCA infarct.  Patient remains profoundly unresponsive, suspect this is likely multifactorial and related to metabolic abnormalities, underlying sepsis and possible seizure/post-ictal state. If mental status fails to improve with continued correction would  consider MRI brain imaging.  -repeat EEG -continue keppra  BID -continued metabolic and infectious treatment per primary team -will continue to follow   LOS: 4 days   Elspeth Cho, DO [facility] [phone]  If 7pm- 7am, please page neurology on call as listed in AMION. 01/11/2015  7:56 AM

## 2015-01-11 NOTE — Progress Notes (Signed)
Placed pt back on full support due to increased breathing

## 2015-01-11 NOTE — Progress Notes (Signed)
[facility] FOR AM LAB REPLACEMENT ONLY  The [facility] does [facility] based on the criteria listed below:   1. Is GFR >/= 40 ml/min? Yes.    Patient's GFR today is41 2. Is urine output >/= 0.5 ml/kg/hr for the last 6 hours? Yes.   Patient's UOP is 0.6 ml/kg/hr 3. Is BUN < 60 mg/dL? Yes.    Patient's BUN today is 42 4. Abnormal electrolyte(s): K 3.4 5. Ordered repletion with: per protocol 6. If a panic level lab has been reported, has the CCM MD in charge been notified? No..   Physician:    Markus DaftWHELAN, Veanna Dower A 01/11/2015 3:59 AM

## 2015-01-12 ENCOUNTER — Inpatient Hospital Stay (HOSPITAL_COMMUNITY): Payer: Medicare Other

## 2015-01-12 DIAGNOSIS — A047 Enterocolitis due to Clostridium difficile: Secondary | ICD-10-CM

## 2015-01-12 DIAGNOSIS — J9690 Respiratory failure, unspecified, unspecified whether with hypoxia or hypercapnia: Secondary | ICD-10-CM

## 2015-01-12 LAB — GLUCOSE, CAPILLARY
GLUCOSE-CAPILLARY: 101 mg/dL — AB (ref 65–99)
GLUCOSE-CAPILLARY: 124 mg/dL — AB (ref 65–99)
GLUCOSE-CAPILLARY: 98 mg/dL (ref 65–99)
Glucose-Capillary: 115 mg/dL — ABNORMAL HIGH (ref 65–99)
Glucose-Capillary: 123 mg/dL — ABNORMAL HIGH (ref 65–99)
Glucose-Capillary: 103 mg/dL — ABNORMAL HIGH (ref 65–99)
Glucose-Capillary: 97 mg/dL (ref 65–99)

## 2015-01-12 LAB — BASIC METABOLIC PANEL
Anion gap: 4 — ABNORMAL LOW (ref 5–15)
BUN: 43 mg/dL — ABNORMAL HIGH (ref 6–20)
CHLORIDE: 123 mmol/L — AB (ref 101–111)
CO2: 19 mmol/L — AB (ref 22–32)
CREATININE: 1.24 mg/dL — AB (ref 0.44–1.00)
Calcium: 11.7 mg/dL — ABNORMAL HIGH (ref 8.9–10.3)
GFR calc Af Amer: 53 mL/min — ABNORMAL LOW (ref >60)
GFR, EST NON AFRICAN AMERICAN: 46 mL/min — AB (ref >60)
Glucose, Bld: 127 mg/dL — ABNORMAL HIGH (ref 65–99)
Potassium: 4.4 mmol/L (ref 3.5–5.1)
Sodium: 146 mmol/L — ABNORMAL HIGH (ref 135–145)

## 2015-01-12 LAB — AMMONIA: Ammonia: 22 umol/L (ref 9–35)

## 2015-01-12 MED ORDER — POTASSIUM CHLORIDE 20 MEQ/15ML (10%) PO SOLN
40.0000 meq | Freq: Once | ORAL | Status: AC
  Administered 2015-01-12: 40 meq
  Filled 2015-01-12: qty 30

## 2015-01-12 MED ORDER — FUROSEMIDE 10 MG/ML IJ SOLN
40.0000 mg | Freq: Four times a day (QID) | INTRAMUSCULAR | Status: AC
  Administered 2015-01-12 (×2): 40 mg via INTRAVENOUS
  Filled 2015-01-12 (×2): qty 4

## 2015-01-12 MED ORDER — FREE WATER
200.0000 mL | Status: DC
  Administered 2015-01-12 – 2015-01-16 (×25): 200 mL

## 2015-01-12 MED ORDER — SODIUM CHLORIDE 0.9 % IV BOLUS (SEPSIS)
1000.0000 mL | Freq: Once | INTRAVENOUS | Status: AC
  Administered 2015-01-12: 1000 mL via INTRAVENOUS

## 2015-01-12 MED ORDER — VANCOMYCIN 50 MG/ML ORAL SOLUTION
500.0000 mg | Freq: Four times a day (QID) | ORAL | Status: DC
  Administered 2015-01-12 – 2015-01-15 (×12): 500 mg via ORAL
  Filled 2015-01-12 (×18): qty 10

## 2015-01-12 MED ORDER — FIDAXOMICIN 200 MG PO TABS
200.0000 mg | ORAL_TABLET | Freq: Two times a day (BID) | ORAL | Status: AC
  Administered 2015-01-12 – 2015-01-25 (×25): 200 mg via ORAL
  Filled 2015-01-12 (×31): qty 1

## 2015-01-12 NOTE — Progress Notes (Signed)
PULMONARY / CRITICAL CARE MEDICINE   Name: Kellie SellsMary E Patty MRN: [id number] DOB: 08/06/1956    ADMISSION DATE:  01/07/2015 CONSULTATION DATE:  01/12/2015  REFERRING MD :  EDP  CHIEF COMPLAINT:  AMS  INITIAL PRESENTATION:  [name] [facility]25 with AMS likely due to hypernatremia as well as aspiration PNA.  In ED, intubated for GCS 3 and PCCM called for admission.  STUDIES:  CXR 5/25 >>> hazy right infrahilar atx or infiltrate. CT head 5/25 >>> no acute process.  Remote right MCA infarct.  Chronic microvascular ischemic changes. 5/26 - eeg>>>generalized non-specific cerebral dysfunction(encephalopathy). There was no seizure  Ct head #2>>>old subacute rt mca  SIGNIFICANT EVENTS: 5/25 - admitted with AMS, probable aspiration PNA, multiple metabolic derangements. 5/26- pupil change, to CT now, fevers, line was placed  SUBJECTIVE: c.diff positive  VITAL SIGNS: Temp:  [97.4 F (36.3 C)-99.4 F (37.4 C)] 97.4 F (36.3 C) (05/30 0343) Pulse Rate:  [54-84] 54 (05/30 0600) Resp:  [13-33] 21 (05/30 0600) BP: (97-125)/(47-67) 109/54 mmHg (05/30 0600) SpO2:  [87 %-100 %] 87 % (05/30 0600) FiO2 (%):  [30 %] 30 % (05/30 0600) Weight:  [88 kg (194 lb 0.1 oz)] 88 kg (194 lb 0.1 oz) (05/30 0351) HEMODYNAMICS: CVP:  [5 mmHg-6 mmHg] 5 mmHg VENTILATOR SETTINGS: Vent Mode:  [-] PRVC FiO2 (%):  [30 %] 30 % Set Rate:  [24 bmp] 24 bmp Vt Set:  [530 mL] 530 mL PEEP:  [5 cmH20] 5 cmH20 Pressure Support:  [10 cmH20] 10 cmH20 Plateau Pressure:  [16 cmH20-20 cmH20] 20 cmH20 INTAKE / OUTPUT: Intake/Output      05/29 0701 - 05/30 0700 05/30 0701 - 05/31 0700   I.V. (mL/kg) 1444.7 (16.4)    NG/GT 610    IV Piggyback 360    Total Intake(mL/kg) 2414.7 (27.4)    Urine (mL/kg/hr) 4175 (2)    Stool     Total Output 4175     Net -1760.3            PHYSICAL EXAMINATION:  Gen; Chronically ill appearing, on vent HENT: NCAT ETT in place PULM: few crackles in bases CV: RRR, no mgr GI:  BS+, soft, nontender MSK: normal bulk, tone Derm: edema/anasarca all over, fingers dusky Neuro opens eyes on commands, localizes to pain, does not follow commands  LABS:  CBC  Recent Labs Lab 01/09/15 0503 01/10/15 0340 01/11/15 0305  WBC 17.7* 11.3* 10.1  HGB 9.2* 9.0* 8.4*  HCT 30.0* 29.9* 27.4*  PLT 148* 132* 110*   Coag's  Recent Labs Lab 01/08/15 0610 01/10/15 0340 01/11/15 0305  INR 2.21* 1.48 1.39   BMET  Recent Labs Lab 01/10/15 0340 01/11/15 0305 01/12/15 0330  NA 144 145 146*  K 3.1* 3.4* 4.4  CL 122* 124* 123*  CO2 15* 16* 19*  BUN 37* 42* 43*  CREATININE 1.45* 1.35* 1.24*  GLUCOSE 118* 117* 127*   Electrolytes  Recent Labs Lab 01/07/15 1433  01/08/15 0610  01/09/15 1400  01/10/15 0340 01/11/15 0305 01/12/15 0330  CALCIUM 12.4*  < > 11.3*  < >  --   < > 11.0* 11.0* 11.7*  MG  --   --  1.6*  --   --   --   --   --   --   PHOS 2.1*  --  1.0*  --  2.5  --   --   --   --   < > = values in this  interval not displayed. Sepsis Markers  Recent Labs Lab 01/07/15 1125 01/07/15 1442 01/08/15 0653  LATICACIDVEN 2.15* 2.6* 0.98   ABG  Recent Labs Lab 01/07/15 1712 01/08/15 0547 01/09/15 0445  PHART 7.316* 7.340* 7.327*  PCO2ART 29.5* 26.7* 28.0*  PO2ART 105.0* 187.0* 205.0*   Liver Enzymes  Recent Labs Lab 01/07/15 1104 01/09/15 0503 01/10/15 0340  AST 22 40 53*  ALT 11* 24 36  ALKPHOS 45 47 54  BILITOT 0.6 0.4 0.2*  ALBUMIN 2.6* 1.7* 1.5*   Cardiac Enzymes No results for input(s): TROPONINI, PROBNP in the last 168 hours. Glucose  Recent Labs Lab 01/11/15 0303 01/11/15 0818 01/11/15 1230 01/11/15 1636 01/11/15 1953 01/12/15 0318  GLUCAP 99 73 94 111* 93 115*    Imaging 5/30 CXR reviewed > improved bilateral airspace disease in bases, some atelectasis, ETT OK  ASSESSMENT / PLAN:  PULMONARY OETT 5/25 >>> A: VDRF due to inability to protect airway, MSSA pneumonia, acute pulm edema Mental status limiting  extubation P:   SBT daily, Diurese today again x2 doses Daily CXR VAP bundle See ID  INFECTIOUS A:   Septic shock > resolved MSSA pneumonia C diff, diagnosed 5/29; family says this is the third time in 6 weeks P:   BCx2 5/25 > UCx 5/25 > Sputum Cx 5/25 >MSSA 5/29 C diff Positive  Vanc, start date 5/25>>> 5/29 Cefepime, start date 5/25>>>5/27 Cefazolin 5/27>>> Flagyl 5/29 x1 Oral vanc 5/29 > Fidaxomicin  bid >  NEUROLOGIC A:   Acute metabolic encephalopathy > persistent 5/29, likely multifactorial from sepsis, hypernatremia Hx bipolar 1 disorder > family says recent use of Lithium has been harmful to mental status; Prior to 07/2014 was living independently Hx of CVA CT head neg acute x 2 P:   Sedation:  Fentanyl PRN  D/C  Versed RASS goal: 0 Daily WUA. Continue to hold outpatient benztropine, diazepam, fluphenazine, norco, meclizine, quetiapine, trazodone Maintain keppra   CARDIOVASCULAR A:  Septic shock > resolved Hx HTN, bradycardia s/p PPM, LVH Poor cap refill ext, currently with normal BP P:  Monitor BP Doppler pulses Keep hands warm  RENAL A:   CKD IV Hypernatremia improved Hypokalemia P:   Continue D5 d/c  Free water with tube feeds Monitor BMET and UOP Replace electrolytes as needed Diurese today again, give with potassium  GASTROINTESTINAL A:   GI prophylaxis Nutrition P:   SUP: Pantoprazole Continue TF   HEMATOLOGIC A:   VTE Prophylaxis Coagulopathy > uncertain etiology, improving P:  Monitor for bleeding  ENDOCRINE A:   Hyperglycemia - no hx of DM P:   SSI if glucose consistently > 180   Family updated:  Discussed with sister Alesia Banda 5/30; Baseline has been hospitalized since December 2015, started with mental issues, then aspiration pneumonia and recurrent c.diff.  During that time she has had intermittent ability to have conversations which are riddled with delusions and hallucinations.  Sister would like to try to  get her off the vent, but she agrees that CPR would be inappropriate (likelihood of meaningful recovery would be very low if she had a cardiac arrest).  Will change code status to limited code blue, continue efforts to get her off the vent this week.  Interdisciplinary Family Meeting v Palliative Care Meeting:  Due by: 5/31.  CC time 45 minutes  Heber Seaford, MD [facility] Pager: [phone] Cell: [phone] After 3pm or if no response, call [phone]

## 2015-01-12 NOTE — Procedures (Signed)
EEG report.  Brief clinical history: 59y/o woman with history of prior R MCA infarct, HTN, NH resident admitted on 5/25 with altered mental status initially though secondary to hypernatremia (Na 160) and sepsis from aspiration PNA. Hypernatremia has resolved and currently being treated for C. Diff.      Technique: this is a 17 channel routine scalp EEG performed at the bedside in the ICU, with bipolar and monopolar montages arranged in accordance to the international 10/20 system of electrode placement. One channel was dedicated to EKG recording.  The patient is unresponsive, intubated on the vent. No activating procedures performed.  Description: as the study begins and throughout the entire recording, there is evidence generalized intermixed frequencies, with a predominance of theta frequencies over the posterior and central regions and occasional delta in the bi frontal regions. No electrographic seizures noted. No focal or generalized epileptiform discharges noted.  EKG showed sinus rhythm.  Impression: this is an abnormal EEG performed in the ICU setting, with findings consistent with a mild to moderate non specific encephalopathy. No electrographic seizures noted.  Clinical correlation is advised.   Wyatt Portelasvaldo Keghan Mcfarren, MD [facility]

## 2015-01-12 NOTE — Progress Notes (Signed)
eLink Physician-Brief Progress Note Patient Name: Kellie SellsMary E Velez DOB: 10/30/1955 MRN: [id number]   Date of Service  01/12/2015  HPI/Events of Note  Hypotension. BP = 82/41. Being actively diuresed with Lasix. CVP = 2.  eICU Interventions  Will order: 1. Hold Lasix pending re-evaluation by primary team in AM. 2. 0.9 NaCl 1 liter IV over 1 hour now.      Intervention Category Intermediate Interventions: Hypotension - evaluation and management  Sommer,Steven Eugene 01/12/2015, 6:12 PM

## 2015-01-12 NOTE — Progress Notes (Signed)
EEG completed, results pending. 

## 2015-01-12 NOTE — Progress Notes (Signed)
Subjective: patient remains intubated, breathing over vent, no further seizure events over night.   Objective: Current vital signs: BP 109/66 mmHg  Pulse 84  Temp(Src) 98.3 F (36.8 C) (Rectal)  Resp 32  Ht 5\' 9"  (1.753 m)  Wt 88 kg (194 lb 0.1 oz)  BMI 28.64 kg/m2  SpO2 87% Vital signs in last 24 hours: Temp:  [97.4 F (36.3 C)-98.6 F (37 C)] 98.3 F (36.8 C) (05/30 0829) Pulse Rate:  [54-84] 84 (05/30 0759) Resp:  [13-33] 32 (05/30 0759) BP: (97-125)/(47-67) 109/66 mmHg (05/30 0759) SpO2:  [87 %-100 %] 87 % (05/30 0600) FiO2 (%):  [30 %] 30 % (05/30 0759) Weight:  [88 kg (194 lb 0.1 oz)] 88 kg (194 lb 0.1 oz) (05/30 0351)  Intake/Output from previous day: 05/29 0701 - 05/30 0700 In: 2414.7 [I.V.:1444.7; NG/GT:610; IV Piggyback:360] Out: 4175 [Urine:4175] Intake/Output this shift:   Nutritional status:    Neurologic Exam: Mental Status: Patient does not respond to verbal stimuli.  Does not respond to deep sternal rub.  Does not follow commands.  No verbalizations noted.  Cranial Nerves: II: patient does not respond confrontation bilaterally, pupils right 3 mm, left 3 mm,and reactive bilaterally III,IV,VI: doll's response present bilaterally.  V,VII: corneal reflex present bilaterally  VIII: patient does not respond to verbal stimuli IX,X: gag reflex present, XI: trapezius strength unable to test bilaterally XII: tongue strength unable to test Motor: Extremities flaccid throughout.  No spontaneous movement noted.  No purposeful movements noted. Sensory: Does not respond to noxious stimuli in any extremity. Deep Tendon Reflexes:  Absent throughout. Plantars: absent bilaterally Cerebellar: Unable to perform    Lab Results: Basic Metabolic Panel:  Recent Labs Lab 01/07/15 1433  01/08/15 0610 01/09/15 0503 01/09/15 1400 01/09/15 1700 01/10/15 0340 01/11/15 0305 01/12/15 0330  NA  --   < > 150* 140  --  146* 144 145 146*  K  --   < > 3.2* 2.8*  --   3.5 3.1* 3.4* 4.4  CL  --   < > 127* 120*  --  123* 122* 124* 123*  CO2  --   < > 19* 15*  --  16* 15* 16* 19*  GLUCOSE  --   < > 161* 142*  --  119* 118* 117* 127*  BUN  --   < > 40* 36*  --  36* 37* 42* 43*  CREATININE  --   < > 1.79* 1.63*  --  1.46* 1.45* 1.35* 1.24*  CALCIUM 12.4*  < > 11.3* 11.0*  --  11.3* 11.0* 11.0* 11.7*  MG  --   --  1.6*  --   --   --   --   --   --   PHOS 2.1*  --  1.0*  --  2.5  --   --   --   --   < > = values in this interval not displayed.  Liver Function Tests:  Recent Labs Lab 01/07/15 1104 01/09/15 0503 01/10/15 0340  AST 22 40 53*  ALT 11* 24 36  ALKPHOS 45 47 54  BILITOT 0.6 0.4 0.2*  PROT 5.8* 4.4* 4.3*  ALBUMIN 2.6* 1.7* 1.5*   No results for input(s): LIPASE, AMYLASE in the last 168 hours. No results for input(s): AMMONIA in the last 168 hours.  CBC:  Recent Labs Lab 01/07/15 1104 01/08/15 0814 01/09/15 0503 01/10/15 0340 01/11/15 0305  WBC 23.1* 15.5* 17.7* 11.3* 10.1  NEUTROABS 17.6*  --  14.5*  --  8.1*  HGB 12.1 8.8* 9.2* 9.0* 8.4*  HCT 42.0 29.3* 30.0* 29.9* 27.4*  MCV 94.8 90.4 88.2 89.3 88.4  PLT 194 177 148* 132* 110*    Cardiac Enzymes: No results for input(s): CKTOTAL, CKMB, CKMBINDEX, TROPONINI in the last 168 hours.  Lipid Panel: No results for input(s): CHOL, TRIG, HDL, CHOLHDL, VLDL, LDLCALC in the last 168 hours.  CBG:  Recent Labs Lab 01/11/15 1230 01/11/15 1636 01/11/15 1953 01/12/15 0318 01/12/15 0822  GLUCAP 94 111* 93 115* 124*    Microbiology: Results for orders placed or performed during the hospital encounter of 01/07/15  Blood culture (routine x 2)     Status: None (Preliminary result)   Collection Time: 01/07/15 11:04 AM  Result Value Ref Range Status   Specimen Description BLOOD LEFT ARM  Final   Special Requests BOTTLES DRAWN AEROBIC AND ANAEROBIC 5CC  Final   Culture   Final           BLOOD CULTURE RECEIVED NO GROWTH TO DATE CULTURE WILL BE HELD FOR 5 DAYS BEFORE ISSUING A  FINAL NEGATIVE REPORT Performed at [facility]    Report Status PENDING  Incomplete  Blood culture (routine x 2)     Status: None (Preliminary result)   Collection Time: 01/07/15 11:23 AM  Result Value Ref Range Status   Specimen Description BLOOD LEFT ARM  Final   Special Requests   Final    BOTTLES DRAWN AEROBIC AND ANAEROBIC 10CCBLUE 5CCRED   Culture   Final           BLOOD CULTURE RECEIVED NO GROWTH TO DATE CULTURE WILL BE HELD FOR 5 DAYS BEFORE ISSUING A FINAL NEGATIVE REPORT Performed at [facility]    Report Status PENDING  Incomplete  Urine culture     Status: None   Collection Time: 01/07/15 11:30 AM  Result Value Ref Range Status   Specimen Description URINE, RANDOM  Final   Special Requests Normal  Final   Colony Count NO GROWTH Performed at [facility]   Final   Culture NO GROWTH Performed at [facility]   Final   Report Status 01/08/2015 FINAL  Final  Culture, respiratory (NON-Expectorated)     Status: None   Collection Time: 01/07/15  4:13 PM  Result Value Ref Range Status   Specimen Description TRACHEAL ASPIRATE  Final   Special Requests NONE  Final   Gram Stain   Final    ABUNDANT WBC PRESENT, PREDOMINANTLY PMN NO SQUAMOUS EPITHELIAL CELLS SEEN ABUNDANT GRAM POSITIVE COCCI IN CLUSTERS Performed at [facility]    Culture   Final    ABUNDANT STAPHYLOCOCCUS AUREUS Note: RIFAMPIN AND GENTAMICIN SHOULD NOT BE USED AS SINGLE DRUGS FOR TREATMENT OF STAPH INFECTIONS. Performed at [facility]    Report Status 01/10/2015 FINAL  Final   Organism ID, Bacteria STAPHYLOCOCCUS AUREUS  Final      Susceptibility   Staphylococcus aureus - MIC*    CLINDAMYCIN <=0.25 SENSITIVE Sensitive     ERYTHROMYCIN <=0.25 SENSITIVE Sensitive     GENTAMICIN <=0.5 SENSITIVE Sensitive     LEVOFLOXACIN 0.25 SENSITIVE Sensitive     OXACILLIN <=0.25 SENSITIVE Sensitive     PENICILLIN 0.06 SENSITIVE Sensitive     RIFAMPIN <=0.5  SENSITIVE Sensitive     TRIMETH/SULFA <=10 SENSITIVE Sensitive     VANCOMYCIN 1 SENSITIVE Sensitive     TETRACYCLINE <=1 SENSITIVE Sensitive     MOXIFLOXACIN <=0.25  SENSITIVE Sensitive     * ABUNDANT STAPHYLOCOCCUS AUREUS  MRSA PCR Screening     Status: None   Collection Time: 01/07/15  4:13 PM  Result Value Ref Range Status   MRSA by PCR NEGATIVE NEGATIVE Final    Comment:        The GeneXpert MRSA Assay (FDA approved for NASAL specimens only), is one component of a comprehensive MRSA colonization surveillance program. It is not intended to diagnose MRSA infection nor to guide or monitor treatment for MRSA infections.   Clostridium Difficile by PCR     Status: Abnormal   Collection Time: 01/11/15  1:44 PM  Result Value Ref Range Status   C difficile by pcr POSITIVE (A) NEGATIVE Final    Comment: CRITICAL RESULT CALLED TO, READ BACK BY AND VERIFIED WITH: ASATSAWO,A RN 01/11/15 1745 WOOTEN,K     Coagulation Studies:  Recent Labs  01/10/15 0340 01/11/15 0305  LABPROT 18.0* 17.2*  INR 1.48 1.39    Imaging: Dg Chest Port 1 View  01/12/2015   CLINICAL DATA:  Hypoxia with acute respiratory failure  EXAM: PORTABLE CHEST - 1 VIEW  COMPARISON:  01/11/2015  FINDINGS: Cardiac shadow is stable. A pacing device, endotracheal tube, nasogastric catheter and right central venous line are again seen and stable. Bibasilar densities are seen slightly increased on the left when compared with the with the previous day.  IMPRESSION: Tubes and lines as described.  Bibasilar changes with slight increase on the left when compared with the previous day.   Electronically Signed   By: Alcide Clever M.D.   On: 01/12/2015 07:35   Dg Chest Port 1 View  01/11/2015   CLINICAL DATA:  Acute respiratory failure  EXAM: PORTABLE CHEST - 1 VIEW  COMPARISON:  01/10/2015  FINDINGS: Bibasilar airspace opacities are stable. There are no new lung abnormalities.  No pneumothorax.  Probable small effusions.   Endotracheal tube tip is not well defined on this exam, but appears to project approximate 5 mm above the carina.  Orogastric tube and right internal jugular central venous line are stable and well positioned.  IMPRESSION: 1. No change from the previous day's study. 2. Persistent basilar airspace opacity which may reflect pneumonia, atelectasis or a combination. No evidence of pulmonary edema. 3. Support apparatus stable position.   Electronically Signed   By: Amie Portland M.D.   On: 01/11/2015 07:13   Dg Abd Portable 1v  01/10/2015   CLINICAL DATA:  Orogastric tube placement.  EXAM: PORTABLE ABDOMEN - 1 VIEW  COMPARISON:  Chest x-ray today.  FINDINGS: Enteric tube is present which has tip right of midline in the upper abdomen likely over the distal stomach/ gastroduodenal junction. Bowel gas pattern is nonobstructive. IVC filter is present with tip just left of midline at the L2 level as patient is slightly rotated to the left. There are mild degenerative changes of the spine. There are several pelvic phleboliths. A small caliber rectal catheter is present.  IMPRESSION: Nonobstructive bowel gas pattern.  Enteric tube with tip right of midline over the upper abdomen likely over the distal stomach or gastroduodenal junction.   Electronically Signed   By: Elberta Fortis M.D.   On: 01/10/2015 12:12    Medications:  Scheduled: . antiseptic oral rinse  7 mL Mouth Rinse QID  .  ceFAZolin (ANCEF) IV  1 g Intravenous 3 times per day  . chlorhexidine  15 mL Mouth Rinse BID  . feeding supplement (PRO-STAT SUGAR FREE 64)  30 mL Per Tube BID  . fidaxomicin  200 mg Oral BID  . free water  200 mL Per Tube Q4H  . furosemide  40 mg Intravenous Q6H  . heparin  5,000 Units Subcutaneous 3 times per day  . levETIRAcetam  500 mg Intravenous Q12H  . metoCLOPramide (REGLAN) injection  5 mg Intravenous Q12H  . pantoprazole (PROTONIX) IV  40 mg Intravenous QHS  . potassium chloride  40 mEq Per Tube Once  . vancomycin   500 mg Oral 4 times per day    Assessment/Plan: 59y/o woman with history of prior R MCA infarct, HTN, NH resident admitted on 5/25 with altered mental status initially though secondary to hypernatremia (Na 160) and sepsis from aspiration PNA. Hypernatremia has resolved and currently being treated for C. Diff.  Repeat EEG pending.   Recommend: 1) Repeat EEG 2) MRI brain 3) Continue to treat underlying infectious and metabolic issues. --per primary team.    Felicie Morn PA-C [facility] [phone]  I personally participated in this patient's evaluation and management, including formulating above clinical impression and management recommendations.  Venetia Maxon M.D. [facility] [phone]   01/12/2015, 8:50 AM

## 2015-01-13 DIAGNOSIS — Z515 Encounter for palliative care: Secondary | ICD-10-CM | POA: Insufficient documentation

## 2015-01-13 DIAGNOSIS — G9340 Encephalopathy, unspecified: Secondary | ICD-10-CM | POA: Insufficient documentation

## 2015-01-13 DIAGNOSIS — R410 Disorientation, unspecified: Secondary | ICD-10-CM

## 2015-01-13 LAB — HEPATIC FUNCTION PANEL
ALBUMIN: 1.5 g/dL — AB (ref 3.5–5.0)
ALT: 5 U/L — ABNORMAL LOW (ref 14–54)
AST: 17 U/L (ref 15–41)
Alkaline Phosphatase: 62 U/L (ref 38–126)
TOTAL PROTEIN: 4.4 g/dL — AB (ref 6.5–8.1)
Total Bilirubin: 0.4 mg/dL (ref 0.3–1.2)

## 2015-01-13 LAB — BASIC METABOLIC PANEL
ANION GAP: 7 (ref 5–15)
BUN: 47 mg/dL — ABNORMAL HIGH (ref 6–20)
CALCIUM: 11.8 mg/dL — AB (ref 8.9–10.3)
CHLORIDE: 117 mmol/L — AB (ref 101–111)
CO2: 22 mmol/L (ref 22–32)
Creatinine, Ser: 1.09 mg/dL — ABNORMAL HIGH (ref 0.44–1.00)
GFR calc Af Amer: >60 mL/min (ref >60)
GFR calc non Af Amer: 54 mL/min — ABNORMAL LOW (ref >60)
Glucose, Bld: 112 mg/dL — ABNORMAL HIGH (ref 65–99)
Potassium: 3.6 mmol/L (ref 3.5–5.1)
Sodium: 146 mmol/L — ABNORMAL HIGH (ref 135–145)

## 2015-01-13 LAB — PTT FACTOR INHIBITOR (MIXING STUDY)
1 HR INCUB PT 11NP: 26 s
Interpretation-PTT Mixing Study: NOT DETECTED
PATIENT POST INCUBATION: 34.9 s
POST INC 11 MIX, NPP + PT: 29 s
PTT: 33 s — AB (ref <30)
Patient-1/1, Incubated Mix-PTT: 31 seconds

## 2015-01-13 LAB — PT FACTOR INHIBITOR (MIXING STUDY)
PATIENT-1/1, IMMEDIATE MIX-PT: 10.2 s
POSTINC11: 10.4 s
PROTIME: 12.2 s — AB (ref 8.9–12.1)
PT, Mixing Interp: NOT DETECTED
Patient post incubation-ptmix: 12.5 seconds
Patient-1/1, Incubated Mix-PT: 10.6 seconds

## 2015-01-13 LAB — GLUCOSE, CAPILLARY
GLUCOSE-CAPILLARY: 104 mg/dL — AB (ref 65–99)
GLUCOSE-CAPILLARY: 89 mg/dL (ref 65–99)
Glucose-Capillary: 101 mg/dL — ABNORMAL HIGH (ref 65–99)
Glucose-Capillary: 109 mg/dL — ABNORMAL HIGH (ref 65–99)
Glucose-Capillary: 115 mg/dL — ABNORMAL HIGH (ref 65–99)

## 2015-01-13 LAB — CULTURE, BLOOD (ROUTINE X 2)
CULTURE: NO GROWTH
Culture: NO GROWTH

## 2015-01-13 LAB — PROTIME-INR
INR: 1.59 — AB (ref 0.00–1.49)
Prothrombin Time: 19 seconds — ABNORMAL HIGH (ref 11.6–15.2)

## 2015-01-13 MED ORDER — FAMOTIDINE 40 MG/5ML PO SUSR
20.0000 mg | Freq: Two times a day (BID) | ORAL | Status: DC
  Administered 2015-01-13 – 2015-01-21 (×18): 20 mg
  Filled 2015-01-13 (×24): qty 2.5

## 2015-01-13 MED ORDER — VITAL AF 1.2 CAL PO LIQD
1000.0000 mL | ORAL | Status: DC
  Administered 2015-01-13 – 2015-01-19 (×6): 1000 mL
  Filled 2015-01-13 (×10): qty 1000

## 2015-01-13 NOTE — Consult Note (Signed)
Consultation Note Date: 01/13/2015   Patient Name: Kellie Velez  DOB: May 15, 1956  MRN: [id number]  Age / Sex: 59 y.o., female   PCP: [name], MD Referring Physician: Kalman Shan, MD  Reason for Consultation: Establishing goals of care  Palliative Care Assessment and Plan Summary of Established Goals of Care and Medical Treatment Preferences    Palliative Care Discussion Held Today Contacts/Participants in Discussion: Primary Decision Maker: sister Kellie Velez HCPOA: yes   Spoke with sister Kellie Velez today. She tells me that prior to [facility] stopping her lithium in December she was living at home independently and still driving.  She was unemployed but on disability for her bipolar. 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  She also had a pet dog, and enjoyed socializing on the phone. Obviously these past 6 months have been a complete change.  Kellie Velez describes difficulty in managing her bipolar even with inpatient psychiatric care requiring ECT and this of course has been complicated by medical issues as noted below. She tells me that the medical doctors at [facility] told her that these aspiration events did "not look good" for her long term recovery.  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  She has only been at SNF?Rehab for few days and it sounds like she had not made great strides at [facility] prior to d.c to rehab.  Kellie Velez has had some conversations with Dr Kendrick Fries here and has been made a limited code as above. We talked about how worrisome her prolonged encephalopathy is, despite not having a known cause.  She understands this.  We talked about potentially having to make difficult decisions about her care if mental status does not improve to point of being able to safely be liberated from vent.  From her discussions with CCM team, she is interested to see if Alyzae can make any progress in the coming days and is "praying for a  miracle".  I sense that this is at least some acknowledgement of how sick Arkie is.  I will touch base with Kellie Velez again tomorrow and see what other questions she has and also how Ila is doing.    Code Status/Advance Care Planning:  Limited Code  Symptom Management:   Acute Encephalopathy hypoactive delirium. Sedating meds appropriately being held  Psycho-social/Spiritual:   Support System: was living independently in December prior to her 6 month long hospitalization. She is widowed (as of few years ago). She had a pet dog whom her mother is now caregiver for.  Her sister Kellie Velez is HCPOA as of 12/24/14. She has several other siblings as well.   Desire for further Chaplaincy support:yes  Prognosis: Unable to determine  Discharge Planning:  TBD       Chief Complaint: Altered Mental Status  History of Present Illness:  Patient is a 59 yo female with PMHx of Bipolar, CVA, CAD who was admitted from [facility] on 5/21 with altered mental status and acute respiratory failure. She had only been at [facility] for a few days and previously was hospitalized in [place], [place] at [facility] from December 2015 until May of this year.  She reportedly entered [facility] after she stopped taking her lithium for bipolar (On since she was 49) because she felt like she was getting too much medication.  Her sister states that she had long psych hospitalization requiring frequent medication adjustments and even ECT in April for catatonic symptoms. Her stay  in psychatric hospital was complciated by PNA felt 2/2 aspiration and she also spent time in medical portion of hospital.  She also had complications of c-diff colitis.  She was released to [facility] for SNF/rehab.  Unfortunately with altered mental status, she was re-admitted here and intubated for respiratory failure.  She has again been treated for sepsis/PNA.  Neurology following for encephalopathy felt most likely to be 2/2  infectious/metabolic etiology at this point. She has note received sedating meds here and her psych meds have been held. She still remains very lethargic.    Primary Diagnoses  Present on Admission:  . Altered mental status  Palliative Review of Systems: Unable to perform ROS 2/2 encephalopathy and MV.  I have reviewed the medical record, interviewed the patient and family, and examined the patient. The following aspects are pertinent.  Past Medical History  Diagnosis Date  . Arthritis   . Hypertension   . Nerve damage   . Osteoporosis   . Hearing loss   . Abdominal pain   . Stroke     TIA's  . Bradycardia, sinus, persistent, severe   . 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 Bipolar 1 disorder   . Heart murmur 08/13/2012    EF 60-65%,severe LVH,Mild AOV regurg, MR,trivial TR  . Mild carotid artery disease 05/05/2005,08/13/2012    left ICA 0-49% diameter reduction 2006; bilateral fibrous plaque mild 2013  . LVH (left ventricular hypertrophy)    History   Social History  . Marital Status: Married    Spouse Name: N/A  . Number of Children: N/A  . Years of Education: N/A   Social History Main Topics  . Smoking status: Current Every Day Smoker -- 0.50 packs/day    Types: Cigarettes  . Smokeless tobacco: Not on file  . Alcohol Use: No  . Drug Use: No  . Sexual Activity: Not on file   Other Topics Concern  . Not on file   Social History Narrative   No family history on file. Scheduled Meds: . antiseptic oral rinse  7 mL Mouth Rinse QID  .  ceFAZolin (ANCEF) IV  1 g Intravenous 3 times per day  . chlorhexidine  15 mL Mouth Rinse BID  . famotidine  20 mg Per Tube BID  . feeding supplement (PRO-STAT SUGAR FREE 64)  30 mL Per Tube BID  . fidaxomicin  200 mg Oral BID  . free water  200 mL Per Tube Q4H  . heparin  5,000 Units Subcutaneous 3 times per day  . levETIRAcetam  500 mg Intravenous Q12H  .  metoCLOPramide (REGLAN) injection  5 mg Intravenous Q12H  . vancomycin  500 mg Oral 4 times per day   Continuous Infusions: . feeding supplement (VITAL AF 1.2 CAL) 1,000 mL (01/12/15 0339)  . norepinephrine (LEVOPHED) Adult infusion Stopped (01/09/15 0800)   PRN Meds:.sodium chloride, acetaminophen, fentaNYL (SUBLIMAZE) injection Medications Prior to Admission:  Prior to Admission medications   Medication Sig Start Date End Date Taking? Authorizing Provider  acetaminophen (TYLENOL) 500 MG tablet Take 500 mg by mouth every 6 (six) hours as needed. For pain 01/02/15  Yes Historical Provider, MD  lidocaine (XYLOCAINE) 2 % solution Take 10 mLs by mouth every 3 (three) hours as needed for mouth pain.  01/02/15  Yes Historical Provider, MD  lithium 300 MG tablet Take 300 mg by mouth 2 (two) times daily.   Yes Historical Provider, MD  Multiple  Vitamins-Minerals (MULTIVITAMIN & MINERAL PO) Take 1 tablet by mouth every morning.   Yes Historical Provider, MD  OLANZapine (ZYPREXA) 5 MG tablet Take 5 mg by mouth at bedtime. 01/02/15  Yes Historical Provider, MD  omeprazole (PRILOSEC) 20 MG capsule Take 20 mg by mouth every morning.   Yes Historical Provider, MD  saccharomyces boulardii (FLORASTOR) 250 MG capsule Take 250 mg by mouth 2 (two) times daily.   Yes Historical Provider, MD   Allergies  Allergen Reactions  . Floxin [Ofloxacin] Nausea And Vomiting and Rash  . Plavix [Clopidogrel Bisulfate]     "stomach pain"  . Epinephrine Other (See Comments)    "makes her feel excited"   CBC:    Component Value Date/Time   WBC 10.1 01/11/2015 0305   HGB 8.4* 01/11/2015 0305   HCT 27.4* 01/11/2015 0305   PLT 110* 01/11/2015 0305   MCV 88.4 01/11/2015 0305   NEUTROABS 8.1* 01/11/2015 0305   LYMPHSABS 1.1 01/11/2015 0305   MONOABS 0.7 01/11/2015 0305   EOSABS 0.2 01/11/2015 0305   BASOSABS 0.0 01/11/2015 0305   Comprehensive Metabolic Panel:    Component Value Date/Time   NA 146* 01/13/2015 1215     K 3.6 01/13/2015 1215   CL 117* 01/13/2015 1215   CO2 22 01/13/2015 1215   BUN 47* 01/13/2015 1215   CREATININE 1.09* 01/13/2015 1215   GLUCOSE 112* 01/13/2015 1215   CALCIUM 11.8* 01/13/2015 1215   CALCIUM 12.4* 01/07/2015 1433   AST 17 01/13/2015 1215   ALT 5* 01/13/2015 1215   ALKPHOS 62 01/13/2015 1215   BILITOT 0.4 01/13/2015 1215   PROT 4.4* 01/13/2015 1215   ALBUMIN 1.5* 01/13/2015 1215    Physical Exam: Vital Signs: BP 105/61 mmHg  Pulse 52  Temp(Src) 98.8 F (37.1 C) (Oral)  Resp 24  Ht 5\' 9"  (1.753 m)  Wt 84.7 kg (186 lb 11.7 oz)  BMI 27.56 kg/m2  SpO2 90% SpO2: SpO2: 90 % O2 Device: O2 Device: Ventilator O2 Flow Rate:   Intake/output summary:  Intake/Output Summary (Last 24 hours) at 01/13/15 1524 Last data filed at 01/13/15 1500  Gross per 24 hour  Intake   2949 ml  Output   2810 ml  Net    139 ml   Baseline Weight: Weight: 103.42 kg (228 lb) Most recent weight: Weight: 84.7 kg (186 lb 11.7 oz)  Exam Findings:  GEN: intubated, no purposeful interaction HEENT: [place], sclera anicteric CV: mild bradycardia LUNGS: CTAB ABD: soft, ND EXT: warm NEURO: opens eyes to sternal rub         Palliative Performance Scale: 20             Additional Data Reviewed: Recent Labs     01/11/15  0305  01/12/15  0330  01/13/15  1215  WBC  10.1   --    --   HGB  8.4*   --    --   PLT  110*   --    --   NA  145  146*  146*  BUN  42*  43*  47*  CREATININE  1.35*  1.24*  1.09*     Time Total: 60 minutes Greater than 50%  of this time was spent counseling and coordinating care related to the above assessment and plan.  Signed by: Leland HerLAMPKIN,Derrin Currey JOHN, DO  Orvis BrillAaron J Azure Barrales, DO  01/13/2015, 3:24 PM  Please contact Palliative Medicine Team phone at [phone] for questions and concerns.

## 2015-01-13 NOTE — Clinical Social Work Note (Signed)
CSW reviewed chart. CSW will contiune to follow, provide support and assist with discharge.   Ayan Heffington, MSW, LCSWA [phone]

## 2015-01-13 NOTE — Progress Notes (Signed)
PULMONARY / CRITICAL CARE MEDICINE   Name: Kellie Velez MRN: [id number] DOB: 03/20/1956    ADMISSION DATE:  01/07/2015 CONSULTATION DATE:  01/13/2015  REFERRING MD :  EDP  CHIEF COMPLAINT:  AMS  INITIAL PRESENTATION:  [name] [facility] with AMS likely due to hypernatremia as well as aspiration PNA.  In ED, intubated for GCS 3 and PCCM called for admission.  STUDIES:  CXR 5/25 >>> hazy right infrahilar atx or infiltrate. CT head 5/25 >>> no acute process.  Remote right MCA infarct.  Chronic microvascular ischemic changes. 5/26 - eeg>>>generalized non-specific cerebral dysfunction(encephalopathy). There was no seizure  Ct head #2>>>old subacute rt mca  SIGNIFICANT EVENTS: 5/25 - admitted with AMS, probable aspiration PNA, multiple metabolic derangements. 5/26- pupil change, to CT now, fevers, line was placed 01/12/15: Dr Kendrick FriesMcQuaid discussion with sister - > Baseline has been hospitalized since December 2015, started with mental issues, then aspiration pneumonia and recurrent c.diff.  During that time she has had intermittent ability to have conversations which are riddled with delusions and hallucinations.  Sister would like to try to get her off the vent, but she agrees that CPR would be inappropriate (likelihood of meaningful recovery would be very low if she had a cardiac arrest).  Will change code status to limited code blue, continue efforts to get her off the vent this week. Also C Diff positive   SUBJECTIVE/OVERNIGHT/INTERVAL HX 01/13/15: Per RN - EEG x 2 is encephalopathy. MRI not possible due to pacer. Last dose of fent 5/25 and versed 5/27-> mostly unrespnsive. Followed 1 command with squeeze yesterday. Still not noticed to move lowers. RUE slightly stronger 2/5 compare to LUE 1/5.  Also rhythmic movement of mouth noted today. RN reports sister DPOA having scheduling issue to make it for family meeting     VITAL SIGNS: Temp:  [98.4 F (36.9 C)-98.7 F (37.1 C)] 98.7  F (37.1 C) (05/31 0812) Pulse Rate:  [35-83] 83 (05/31 1100) Resp:  [19-35] 24 (05/31 1100) BP: (79-120)/(35-63) 95/52 mmHg (05/31 1100) SpO2:  [89 %-100 %] 100 % (05/31 1100) FiO2 (%):  [30 %] 30 % (05/31 0800) Weight:  [84.7 kg (186 lb 11.7 oz)] 84.7 kg (186 lb 11.7 oz) (05/31 0500) HEMODYNAMICS: CVP:  [1 mmHg-3 mmHg] 1 mmHg VENTILATOR SETTINGS: Vent Mode:  [-] PRVC FiO2 (%):  [30 %] 30 % Set Rate:  [24 bmp] 24 bmp Vt Set:  [530 mL] 530 mL PEEP:  [5 cmH20] 5 cmH20 Pressure Support:  [8 cmH20] 8 cmH20 Plateau Pressure:  [17 cmH20-20 cmH20] 17 cmH20 INTAKE / OUTPUT: Intake/Output      05/30 0701 - 05/31 0700 05/31 0701 - 06/01 0700   I.V. (mL/kg) 230 (2.7) 30 (0.4)   NG/GT 1330 380   IV Piggyback 1359 105   Total Intake(mL/kg) 2919 (34.5) 515 (6.1)   Urine (mL/kg/hr) 3725 (1.8) 350 (0.8)   Total Output 3725 350   Net -806 +165          PHYSICAL EXAMINATION:  Gen; Critically and Chronically ill appearing, on vent HENT: NCAT ETT in place PULM: few crackles in bases CV: RRR, no mgr GI: BS+, soft, nontender MSK: normal bulk, tone Derm: edema/anasarca all over, fingers dusky Neuro : moves mouth in rhythmic movement . Not opening eyes.   LABS:  PULMONARY  Recent Labs Lab 01/07/15 1126 01/07/15 1712 01/08/15 0547 01/09/15 0445  PHART 7.161* 7.316* 7.340* 7.327*  PCO2ART 63.2* 29.5* 26.7* 28.0*  PO2ART  127.0* 105.0* 187.0* 205.0*  HCO3 21.9 15.0* 14.4* 14.7*  TCO2 O2SAT 97.0 98.0 100.0 100.0    CBC  Recent Labs Lab 01/09/15 0503 01/10/15 0340 01/11/15 0305  HGB 9.2* 9.0* 8.4*  HCT 30.0* 29.9* 27.4*  WBC 17.7* 11.3* 10.1  PLT 148* 132* 110*    COAGULATION  Recent Labs Lab 01/07/15 1104 01/08/15 0610 01/10/15 0340 01/11/15 0305  INR 2.32* 2.21* 1.48 1.39    CARDIAC  No results for input(s): TROPONINI in the last 168 hours. No results for input(s): PROBNP in the last 168 hours.   CHEMISTRY  Recent Labs Lab 01/07/15 1433   01/08/15 0610 01/09/15 0503 01/09/15 1400 01/09/15 1700 01/10/15 0340 01/11/15 0305 01/12/15 0330  NA  --   < > 150* 140  --  146* 144 145 146*  K  --   < > 3.2* 2.8*  --  3.5 3.1* 3.4* 4.4  CL  --   < > 127* 120*  --  123* 122* 124* 123*  CO2  --   < > 19* 15*  --  16* 15* 16* 19*  GLUCOSE  --   < > 161* 142*  --  119* 118* 117* 127*  BUN  --   < > 40* 36*  --  36* 37* 42* 43*  CREATININE  --   < > 1.79* 1.63*  --  1.46* 1.45* 1.35* 1.24*  CALCIUM 12.4*  < > 11.3* 11.0*  --  11.3* 11.0* 11.0* 11.7*  MG  --   --  1.6*  --   --   --   --   --   --   PHOS 2.1*  --  1.0*  --  2.5  --   --   --   --   < > = values in this interval not displayed. Estimated Creatinine Clearance: 55.4 mL/min (by C-G formula based on Cr of 1.24).   LIVER  Recent Labs Lab 01/07/15 1104 01/08/15 0610 01/09/15 0503 01/10/15 0340 01/11/15 0305  AST 22  --  40 53*  --   ALT 11*  --  24 36  --   ALKPHOS 45  --  47 54  --   BILITOT 0.6  --  0.4 0.2*  --   PROT 5.8*  --  4.4* 4.3*  --   ALBUMIN 2.6*  --  1.7* 1.5*  --   INR 2.32* 2.21*  --  1.48 1.39     INFECTIOUS  Recent Labs Lab 01/07/15 1125 01/07/15 1442 01/08/15 0653  LATICACIDVEN 2.15* 2.6* 0.98     ENDOCRINE CBG (last 3)   Recent Labs  01/12/15 2333 01/13/15 0349 01/13/15 0807  GLUCAP 98 104* 89         IMAGING x48h  - image(s) personally visualized  -   highlighted in bold Dg Chest Port 1 View  01/12/2015   CLINICAL DATA:  Hypoxia with acute respiratory failure  EXAM: PORTABLE CHEST - 1 VIEW  COMPARISON:  01/11/2015  FINDINGS: Cardiac shadow is stable. A pacing device, endotracheal tube, nasogastric catheter and right central venous line are again seen and stable. Bibasilar densities are seen slightly increased on the left when compared with the with the previous day.  IMPRESSION: Tubes and lines as described.  Bibasilar changes with slight increase on the left when compared with the previous day.   Electronically Signed    By: Alcide Clever M.D.   On: 01/12/2015 07:35  ASSESSMENT / PLAN:  PULMONARY OETT 5/25 >>> A: VDRF due to inability to protect airway, MSSA pneumonia, acute pulm edema   01/13/15: Mental status limiting extubation   P:   SBT daily, CXR in AM VAP bundle See ID  INFECTIOUS BCx2 5/25 > UCx 5/25 > Sputum Cx 5/25 >MSSA 5/29 C diff Positive  A:   Septic shock > resolved MSSA pneumonia C diff, diagnosed 5/29; family says this is the third time in 6 weeks   - not on pressors. Afebrile   P:    Vanc, start date 5/25>>> 5/29 Cefepime, start date 5/25>>>5/27 Cefazolin 5/27>>> Flagyl 5/29 x1 Oral vanc 5/29 > Fidaxomicin  bid >  NEUROLOGIC A:   Acute metabolic encephalopathy > persistent 5/29, likely multifactorial from sepsis, hypernatremia Hx bipolar 1 disorder > family says recent use of Lithium has been harmful to mental status; Prior to 07/2014 was living independently Hx of CVA CT head neg acute x 2  - 01/13/15: Contnued encephalopathy.  Unable to get MRI due to pacer  P:   Sedation:  Fentanyl PRN  D/C  Versed RASS goal: 0 Daily WUA. Continue to hold outpatient benztropine, diazepam, fluphenazine, norco, meclizine, quetiapine, trazodone Maintain keppra Appreciate neuro input   CARDIOVASCULAR A:  Septic shock > resolved Hx HTN, bradycardia s/p PPM, LVH Poor cap refill ext, currently with normal BP   - not on pressors  P:  Monitor BP Doppler pulses  RENAL A:   CKD IV Hypernatremia improved  P   Free water with tube feeds Monitor BMET and UOP Replace electrolytes as needed   GASTROINTESTINAL A:   GI prophylaxis Nutrition P:   SUP: Pantoprazole -> change to H2 blockade in setting of C Diff Continue TF   HEMATOLOGIC A:   VTE Prophylaxis Coagulopathy > uncertain etiology, improving P:  Monitor for bleeding  ENDOCRINE A:   Hyperglycemia - no hx of DM P:   SSI if glucose consistently > 180   Family updated:   Discussed with sister Alesia Banda 5/30;  Interdisciplinary Family Meeting v Palliative Care Meeting:  Due by: 5/31. -Palliative goals of care calledd for 01/13/15 due to schedule issues of DPOA       The patient is critically ill with multiple organ systems failure and requires high complexity decision making for assessment and support, frequent evaluation and titration of therapies, application of advanced monitoring technologies and extensive interpretation of multiple databases.   Critical Care Time devoted to patient care services described in this note is  30  Minutes. This time reflects time of care of this signee Dr Kalman Shan. This critical care time does not reflect procedure time, or teaching time or supervisory time of PA/NP/Med student/Med Resident etc but could involve care discussion time    Dr. Kalman Shan, M.D., [facility].C.P Pulmonary and Critical Care Medicine Staff Physician [facility] Pager: [phone], If no answer or between  15:00h - 7:00h: call [phone]  01/13/2015 12:11 PM

## 2015-01-13 NOTE — Consult Note (Addendum)
WOC wound consult note Reason for Consult: Consult requested for buttocks and sacrum. Wound type: Multiple patchy areas of partial thickness skin loss across bilat buttocks and sacrum. All are red and moist.  No odor or drainage.  Appearance consistent with moisture associated skin damage. Pressure Ulcer POA: This is NOT a pressure ulcer, but areas were present on admission. Measurement: Affected areas 8X8cm Dressing procedure/placement/frequency: Foam dressing to protect and promote healing.  If patient is frequently incontinent, then foam dressing should be left off and barrier cream can be applied to protect sites.  Pt is on a Sport low airloss bed which will decrease pressure and moisture to affected areas. No family present to discuss plan of care and pt is intubated. Please re-consult if further assistance is needed.  Thank-you,  Cammie Mcgeeawn Keirston Saephanh MSN, RN, CWOCN, StaffordWCN-AP, CNS [phone]

## 2015-01-13 NOTE — Progress Notes (Signed)
Subjective: Remains on the vent.  No response to verbal stimuli and not following commands.  Will move right arm and leg spontaneously. Over night had a period of hypotension 82/41.  Currently 94/52.  Objective: Current vital signs: BP 94/52 mmHg  Pulse 79  Temp(Src) 98.7 F (37.1 C) (Oral)  Resp 24  Ht  (1.753 m)  Wt 84.7 kg (186 lb 11.7 oz)  BMI 27.56 kg/m2  SpO2 90% Vital signs in last 24 hours: Temp:  [97.3 F (36.3 C)-98.7 F (37.1 C)] 98.7 F (37.1 C) (05/31 0812) Pulse Rate:  [35-79] 79 (05/31 0800) Resp:  [19-35] 24 (05/31 0800) BP: (79-115)/(35-61) 94/52 mmHg (05/31 0800) SpO2:  [89 %-100 %] 90 % (05/31 0800) FiO2 (%):  [30 %] 30 % (05/31 0743) Weight:  [84.7 kg (186 lb 11.7 oz)] 84.7 kg (186 lb 11.7 oz) (05/31 0500)  Intake/Output from previous day: 05/30 0701 - 05/31 0700 In: 2919 [I.V.:230; NG/GT:1330; IV Piggyback:1359] Out: 3725 [Urine:3725] Intake/Output this shift: Total I/O In: 335 [I.V.:10; NG/GT:220; IV Piggyback:105] Out: 125 [Urine:125] Nutritional status:    Neurologic Exam: Mental Status: Patient does not respond to verbal stimuli. Does not respond to deep sternal rub. Does not follow commands. No verbalizations noted.  Cranial Nerves: II: patient does respond confrontation bilaterally, pupils right 3 mm, left 3 mm,and reactive bilaterally III,IV,VI: doll's response present bilaterally.  V,VII: corneal reflex present bilaterally  VIII: patient does not respond to verbal stimuli IX,X: gag reflex present, XI: trapezius strength unable to test bilaterally XII: tongue strength unable to test Motor: Extremities flaccid throughout.  spontaneous movement noted on the right arm and leg. No purposeful movements noted. Sensory: Does respond to noxious stimuli in periorbital region by wincing. Deep Tendon Reflexes:  Absent throughout. Plantars: absent bilaterally Cerebellar: Unable to perform  Lab Results: Basic Metabolic  Panel:  Recent Labs Lab 01/07/15 1433  01/08/15 0610 01/09/15 0503 01/09/15 1400 01/09/15 1700 01/10/15 0340 01/11/15 0305 01/12/15 0330  NA  --   < > 150* 140  --  146* 144 145 146*  K  --   < > 3.2* 2.8*  --  3.5 3.1* 3.4* 4.4  CL  --   < > 127* 120*  --  123* 122* 124* 123*  CO2  --   < > 19* 15*  --  16* 15* 16* 19*  GLUCOSE  --   < > 161* 142*  --  119* 118* 117* 127*  BUN  --   < > 40* 36*  --  36* 37* 42* 43*  CREATININE  --   < > 1.79* 1.63*  --  1.46* 1.45* 1.35* 1.24*  CALCIUM 12.4*  < > 11.3* 11.0*  --  11.3* 11.0* 11.0* 11.7*  MG  --   --  1.6*  --   --   --   --   --   --   PHOS 2.1*  --  1.0*  --  2.5  --   --   --   --   < > = values in this interval not displayed.  Liver Function Tests:  Recent Labs Lab 01/07/15 1104 01/09/15 0503 01/10/15 0340  AST 22 40 53*  ALT 11* 24 36  ALKPHOS 45 47 54  BILITOT 0.6 0.4 0.2*  PROT 5.8* 4.4* 4.3*  ALBUMIN 2.6* 1.7* 1.5*   No results for input(s): LIPASE, AMYLASE in the last 168 hours.  Recent Labs Lab 01/12/15 1118  AMMONIA 22  CBC:  Recent Labs Lab 01/07/15 1104 01/08/15 0814 01/09/15 0503 01/10/15 0340 01/11/15 0305  WBC 23.1* 15.5* 17.7* 11.3* 10.1  NEUTROABS 17.6*  --  14.5*  --  8.1*  HGB 12.1 8.8* 9.2* 9.0* 8.4*  HCT 42.0 29.3* 30.0* 29.9* 27.4*  MCV 94.8 90.4 88.2 89.3 88.4  PLT 194 177 148* 132* 110*    Cardiac Enzymes: No results for input(s): CKTOTAL, CKMB, CKMBINDEX, TROPONINI in the last 168 hours.  Lipid Panel: No results for input(s): CHOL, TRIG, HDL, CHOLHDL, VLDL, LDLCALC in the last 168 hours.  CBG:  Recent Labs Lab 01/12/15 1136 01/12/15 1620 01/12/15 2023 01/12/15 2333 01/13/15 0349  GLUCAP 123* 97 101* 98 104*    Microbiology: Results for orders placed or performed during the hospital encounter of 01/07/15  Blood culture (routine x 2)     Status: None   Collection Time: 01/07/15 11:04 AM  Result Value Ref Range Status   Specimen Description BLOOD LEFT ARM   Final   Special Requests BOTTLES DRAWN AEROBIC AND ANAEROBIC 5CC  Final   Culture   Final    NO GROWTH 5 DAYS Performed at [facility]    Report Status 01/13/2015 FINAL  Final  Blood culture (routine x 2)     Status: None   Collection Time: 01/07/15 11:23 AM  Result Value Ref Range Status   Specimen Description BLOOD LEFT ARM  Final   Special Requests   Final    BOTTLES DRAWN AEROBIC AND ANAEROBIC 10CCBLUE 5CCRED   Culture   Final    NO GROWTH 5 DAYS Performed at [facility]    Report Status 01/13/2015 FINAL  Final  Urine culture     Status: None   Collection Time: 01/07/15 11:30 AM  Result Value Ref Range Status   Specimen Description URINE, RANDOM  Final   Special Requests Normal  Final   Colony Count NO GROWTH Performed at [facility]   Final   Culture NO GROWTH Performed at [facility]   Final   Report Status 01/08/2015 FINAL  Final  Culture, respiratory (NON-Expectorated)     Status: None   Collection Time: 01/07/15  4:13 PM  Result Value Ref Range Status   Specimen Description TRACHEAL ASPIRATE  Final   Special Requests NONE  Final   Gram Stain   Final    ABUNDANT WBC PRESENT, PREDOMINANTLY PMN NO SQUAMOUS EPITHELIAL CELLS SEEN ABUNDANT GRAM POSITIVE COCCI IN CLUSTERS Performed at [facility]    Culture   Final    ABUNDANT STAPHYLOCOCCUS AUREUS Note: RIFAMPIN AND GENTAMICIN SHOULD NOT BE USED AS SINGLE DRUGS FOR TREATMENT OF STAPH INFECTIONS. Performed at [facility]    Report Status 01/10/2015 FINAL  Final   Organism ID, Bacteria STAPHYLOCOCCUS AUREUS  Final      Susceptibility   Staphylococcus aureus - MIC*    CLINDAMYCIN <=0.25 SENSITIVE Sensitive     ERYTHROMYCIN <=0.25 SENSITIVE Sensitive     GENTAMICIN <=0.5 SENSITIVE Sensitive     LEVOFLOXACIN 0.25 SENSITIVE Sensitive     OXACILLIN <=0.25 SENSITIVE Sensitive     PENICILLIN 0.06 SENSITIVE Sensitive     RIFAMPIN <=0.5 SENSITIVE Sensitive      TRIMETH/SULFA <=10 SENSITIVE Sensitive     VANCOMYCIN 1 SENSITIVE Sensitive     TETRACYCLINE <=1 SENSITIVE Sensitive     MOXIFLOXACIN <=0.25 SENSITIVE Sensitive     * ABUNDANT STAPHYLOCOCCUS AUREUS  MRSA PCR Screening     Status: None  Collection Time: 01/07/15  4:13 PM  Result Value Ref Range Status   MRSA by PCR NEGATIVE NEGATIVE Final    Comment:        The GeneXpert MRSA Assay (FDA approved for NASAL specimens only), is one component of a comprehensive MRSA colonization surveillance program. It is not intended to diagnose MRSA infection nor to guide or monitor treatment for MRSA infections.   Clostridium Difficile by PCR     Status: Abnormal   Collection Time: 01/11/15  1:44 PM  Result Value Ref Range Status   C difficile by pcr POSITIVE (A) NEGATIVE Final    Comment: CRITICAL RESULT CALLED TO, READ BACK BY AND VERIFIED WITH: ASATSAWO,A RN 01/11/15 1745 WOOTEN,K     Coagulation Studies:  Recent Labs  01/11/15 0305  LABPROT 17.2*  INR 1.39    Imaging: Dg Chest Port 1 View  01/12/2015   CLINICAL DATA:  Hypoxia with acute respiratory failure  EXAM: PORTABLE CHEST - 1 VIEW  COMPARISON:  01/11/2015  FINDINGS: Cardiac shadow is stable. A pacing device, endotracheal tube, nasogastric catheter and right central venous line are again seen and stable. Bibasilar densities are seen slightly increased on the left when compared with the with the previous day.  IMPRESSION: Tubes and lines as described.  Bibasilar changes with slight increase on the left when compared with the previous day.   Electronically Signed   By: Alcide Clever M.D.   On: 01/12/2015 07:35    MRI brain pending  EEG: Impression: this is an abnormal EEG performed in the ICU setting, with findings consistent with a mild to moderate non specific encephalopathy. No electrographic seizures noted.    Medications:  Scheduled: . antiseptic oral rinse  7 mL Mouth Rinse QID  .  ceFAZolin (ANCEF) IV  1 g Intravenous  3 times per day  . chlorhexidine  15 mL Mouth Rinse BID  . feeding supplement (PRO-STAT SUGAR FREE 64)  30 mL Per Tube BID  . fidaxomicin  200 mg Oral BID  . free water  200 mL Per Tube Q4H  . heparin  5,000 Units Subcutaneous 3 times per day  . levETIRAcetam  500 mg Intravenous Q12H  . metoCLOPramide (REGLAN) injection  5 mg Intravenous Q12H  . pantoprazole (PROTONIX) IV  40 mg Intravenous QHS  . vancomycin  500 mg Oral 4 times per day    Assessment/Plan: 59y/o woman with history of prior R MCA infarct, HTN, NH resident admitted on 5/25 with altered mental status initially though secondary to hypernatremia (Na 160) and sepsis from aspiration PNA. Hypernatremia has resolved and currently being treated for C. Diff.EEG shows no electrographic seizure activity.  moving right arm and leg spontaneously but not purposefully   Recommend: 1) Repeat CT scan of the head without contrast 2) Continue to treat underlying infectious and metabolic issues. --per primary team.    Felicie Morn PA-C [facility] [phone]  01/13/2015, 8:34 AM   I personally participated in this patient's evaluation and management, including formulating above clinical impression and management recommendations.  Venetia Maxon M.D. [facility] [phone]

## 2015-01-13 NOTE — Progress Notes (Signed)
Informed by MRI that patient's pacemaker is not MRI compatible. Dr Roseanne RenoStewart and Felicie Mornavid Smith PA infromed

## 2015-01-13 NOTE — Progress Notes (Signed)
Nutrition Follow-up  DOCUMENTATION CODES:  Not applicable  INTERVENTION:  Resume TF via OGT with Vital AF 1.2 at new goal rate of 50 ml/h (1200 ml per day) with Prostat 30 ml BID to provide 1640 kcals, 120 gm protein, 973 ml free water daily.  NUTRITION DIAGNOSIS:  Inadequate oral intake related to inability to eat as evidenced by NPO status.  Ongoing  GOAL:  Patient will meet greater than or equal to 90% of their needs  Unmet  MONITOR:  TF tolerance, Vent status, Weight trends, Skin  ASSESSMENT: [facility] with AMS likely due to hypernatremia as well as aspiration PNA.  Discussed patient in ICU rounds and with RN today. Patient is positive for C diff and has a stage 2 wound (MASD); also remote CVA. TF rate reduced to 20 ml/h for residual = 700 ml on 5/27. Patient with minimal residuals over the past 3 days. TF being increased to goal rate today.  Vital AF 1.2 at goal rate of 55 ml/h (1320 ml per day) with Prostat 30 ml BID will provide 1784 kcals, 129 gm protein, 1071 ml free water daily.  Labs reviewed: Sodium is elevated. Receiving 200 ml free water flushes every 4 hours.  Patient is currently intubated on ventilator support MV: 6.2 L/min Temp (24hrs), Avg:98.5 F (36.9 C), Min:98.4 F (36.9 C), Max:98.8 F (37.1 C)  Propofol: none   Height:  Ht Readings from Last 1 Encounters:  01/07/15 5\' 9"  (1.753 m)    Weight:  Wt Readings from Last 1 Encounters:  01/13/15 186 lb 11.7 oz (84.7 kg)   01/08/15 186 lb 4.6 oz (84.5 kg)        Ideal Body Weight:  66 kg   BMI:  Body mass index is 27.56 kg/(m^2).  Estimated Nutritional Needs:  Kcal:  1597  Protein:  105-120 gm  Fluid:  1.6-1.8 L  Skin:  Wound (see comment) (Cyanosis, MSAD groin, Stage II pressure ulcer on sacrum)  Diet Order:   NPO  EDUCATION NEEDS:  Education needs no appropriate at this time   Intake/Output Summary (Last 24 hours) at 01/13/15 1543 Last data  filed at 01/13/15 1500  Gross per 24 hour  Intake   2949 ml  Output   2810 ml  Net    139 ml    Last BM:  5/30   Joaquin CourtsKimberly Harris, RD, LDN, CNSC Pager [phone] After Hours Pager [phone]

## 2015-01-14 ENCOUNTER — Inpatient Hospital Stay (HOSPITAL_COMMUNITY): Payer: Medicare Other

## 2015-01-14 LAB — BASIC METABOLIC PANEL
ANION GAP: 9 (ref 5–15)
BUN: 46 mg/dL — AB (ref 6–20)
CHLORIDE: 113 mmol/L — AB (ref 101–111)
CO2: 20 mmol/L — AB (ref 22–32)
Calcium: 11.5 mg/dL — ABNORMAL HIGH (ref 8.9–10.3)
Creatinine, Ser: 1.07 mg/dL — ABNORMAL HIGH (ref 0.44–1.00)
GFR calc Af Amer: >60 mL/min (ref >60)
GFR calc non Af Amer: 55 mL/min — ABNORMAL LOW (ref >60)
GLUCOSE: 120 mg/dL — AB (ref 65–99)
Potassium: 3.2 mmol/L — ABNORMAL LOW (ref 3.5–5.1)
SODIUM: 142 mmol/L (ref 135–145)

## 2015-01-14 LAB — GLUCOSE, CAPILLARY
GLUCOSE-CAPILLARY: 104 mg/dL — AB (ref 65–99)
GLUCOSE-CAPILLARY: 134 mg/dL — AB (ref 65–99)
Glucose-Capillary: 106 mg/dL — ABNORMAL HIGH (ref 65–99)
Glucose-Capillary: 124 mg/dL — ABNORMAL HIGH (ref 65–99)
Glucose-Capillary: 131 mg/dL — ABNORMAL HIGH (ref 65–99)

## 2015-01-14 LAB — MAGNESIUM: MAGNESIUM: 1.8 mg/dL (ref 1.7–2.4)

## 2015-01-14 LAB — CBC WITH DIFFERENTIAL/PLATELET
BASOS ABS: 0.1 10*3/uL (ref 0.0–0.1)
Basophils Relative: 1 % (ref 0–1)
EOS ABS: 0.1 10*3/uL (ref 0.0–0.7)
Eosinophils Relative: 2 % (ref 0–5)
HEMATOCRIT: 25.8 % — AB (ref 36.0–46.0)
Hemoglobin: 8.1 g/dL — ABNORMAL LOW (ref 12.0–15.0)
LYMPHS ABS: 1.6 10*3/uL (ref 0.7–4.0)
LYMPHS PCT: 22 % (ref 12–46)
MCH: 27.2 pg (ref 26.0–34.0)
MCHC: 31.4 g/dL (ref 30.0–36.0)
MCV: 86.6 fL (ref 78.0–100.0)
MONO ABS: 0.8 10*3/uL (ref 0.1–1.0)
MONOS PCT: 11 % (ref 3–12)
NEUTROS PCT: 64 % (ref 43–77)
Neutro Abs: 4.5 10*3/uL (ref 1.7–7.7)
Platelets: 172 10*3/uL (ref 150–400)
RBC: 2.98 MIL/uL — ABNORMAL LOW (ref 3.87–5.11)
RDW: 20.3 % — AB (ref 11.5–15.5)
WBC Morphology: INCREASED
WBC: 7.1 10*3/uL (ref 4.0–10.5)

## 2015-01-14 LAB — PROTIME-INR
INR: 1.41 (ref 0.00–1.49)
PROTHROMBIN TIME: 17.4 s — AB (ref 11.6–15.2)

## 2015-01-14 LAB — HEPATIC FUNCTION PANEL
ALT: <5 U/L — ABNORMAL LOW (ref 14–54)
AST: 25 U/L (ref 15–41)
Albumin: 1.5 g/dL — ABNORMAL LOW (ref 3.5–5.0)
Alkaline Phosphatase: 65 U/L (ref 38–126)
Bilirubin, Direct: <0.1 mg/dL — ABNORMAL LOW (ref 0.1–0.5)
TOTAL PROTEIN: 4.6 g/dL — AB (ref 6.5–8.1)
Total Bilirubin: 0.3 mg/dL (ref 0.3–1.2)

## 2015-01-14 LAB — PHOSPHORUS: Phosphorus: 2.5 mg/dL (ref 2.5–4.6)

## 2015-01-14 MED ORDER — MAGNESIUM SULFATE 2 GM/50ML IV SOLN
2.0000 g | Freq: Once | INTRAVENOUS | Status: AC
  Administered 2015-01-14: 2 g via INTRAVENOUS
  Filled 2015-01-14: qty 50

## 2015-01-14 MED ORDER — POTASSIUM CHLORIDE 10 MEQ/50ML IV SOLN
10.0000 meq | INTRAVENOUS | Status: AC
  Administered 2015-01-14 (×4): 10 meq via INTRAVENOUS
  Filled 2015-01-14 (×4): qty 50

## 2015-01-14 NOTE — Care Management Note (Signed)
Case Management Note  Patient Details  Name: Nita SellsMary E Gilberti MRN: [id number] Date of Birth: 04/30/1956  Subjective/Objective:                    Action/Plan:   Expected Discharge Date:                  Expected Discharge Plan:  Skilled Nursing Facility  In-House Referral:  Clinical Social Work  Discharge planning Services     Post Acute Care Choice:    Choice offered to:     DME Arranged:    DME Agency:     HH Arranged:    HH Agency:     Status of Service:  In process, will continue to follow  Medicare Important Message Given:    Date Medicare IM Given:    Medicare IM give by:    Date Additional Medicare IM Given:    Additional Medicare Important Message give by:     If discussed at Long Length of Stay Meetings, dates discussed:  01/13/2015  Additional Comments:  Yvone Neurutchfield, Hamzeh Tall M, RN 01/14/2015, 8:59 AM

## 2015-01-14 NOTE — Progress Notes (Signed)
Daily Progress Note   Patient Name: Kellie SellsMary E Velez       Date: 01/14/2015 DOB: 05/30/1956  Age: 59 y.o. MRN#: [id number] Attending Physician: Kalman ShanMurali Ramaswamy, MD Primary Care Physician: Kaleen MaskELKINS,WILSON OLIVER, MD Admit Date: 01/07/2015  Reason for Consultation/Follow-up: Establishing goals of care  Subjective: Minimally responsive. Vented.    Length of Stay: 7 days  Current Medications: Scheduled Meds:  . antiseptic oral rinse  7 mL Mouth Rinse QID  .  ceFAZolin (ANCEF) IV  1 g Intravenous 3 times per day  . chlorhexidine  15 mL Mouth Rinse BID  . famotidine  20 mg Per Tube BID  . feeding supplement (PRO-STAT SUGAR FREE 64)  30 mL Per Tube BID  . fidaxomicin  200 mg Oral BID  . free water  200 mL Per Tube Q4H  . heparin  5,000 Units Subcutaneous 3 times per day  . levETIRAcetam  500 mg Intravenous Q12H  . magnesium sulfate 1 - 4 g bolus IVPB  2 g Intravenous Once  . metoCLOPramide (REGLAN) injection  5 mg Intravenous Q12H  . potassium chloride  10 mEq Intravenous Q1 Hr x 4  . vancomycin  500 mg Oral 4 times per day    Continuous Infusions: . feeding supplement (VITAL AF 1.2 CAL) 1,000 mL (01/13/15 1655)  . norepinephrine (LEVOPHED) Adult infusion Stopped (01/09/15 0800)    PRN Meds: sodium chloride, acetaminophen, fentaNYL (SUBLIMAZE) injection  Vital Signs: BP 106/60 mmHg  Pulse 70  Temp(Src) 97.5 F (36.4 C) (Oral)  Resp 21  Ht 5\' 9"  (1.753 m)  Wt 84 kg (185 lb 3 oz)  BMI 27.33 kg/m2  SpO2 100% SpO2: SpO2: 100 % O2 Device: O2 Device: Ventilator O2 Flow Rate:    Intake/output summary:  Intake/Output Summary (Last 24 hours) at 01/14/15 1325 Last data filed at 01/14/15 1000  Gross per 24 hour  Intake   1760 ml  Output    945 ml  Net    815 ml   Baseline Weight: Weight: 103.42 kg (228 lb) Most recent weight: Weight: 84 kg (185 lb 3 oz)  Physical Exam: GEN: intubated, NAD HEENT: sclera anicteric CV: reg rate EXT: edema, warm  Additional Data  Reviewed: Recent Labs     01/13/15  1215  01/14/15  0445  WBC   --   7.1  HGB   --   8.1*  PLT   --   172  NA  146*  142  BUN  47*  46*  CREATININE  1.09*  1.07*     Problem List:  Patient Active Problem List   Diagnosis Date Noted  . Palliative care encounter   . Encephalopathy   . Acute pulmonary edema   . Acute respiratory failure with hypoxia   . Hypernatremia   . MSSA (methicillin susceptible Staphylococcus aureus) pneumonia   . Encephalopathy acute   . Pressure ulcer 01/09/2015  . ARF (acute respiratory failure)   . Pneumonia   . Pupil asymmetry   . Altered mental status 01/07/2015  . Respiratory failure   . Blood poisoning   . Bipolar disorder, current episode mixed, severe, with psychotic features   . Psychoses   . 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 Unspecified essential hypertension 09/28/2012  . Bipolar disorder, unspecified 09/28/2012  . TIA (transient ischemic attack) 09/28/2012  . Mild carotid artery disease 09/28/2012  . Osteoporosis, unspecified 09/28/2012  . Bradycardia 09/28/2012  . Heart murmur 09/28/2012  . Hearing loss 09/28/2012  . Arthritis 09/28/2012  .  LVH (left ventricular hypertrophy) 09/28/2012     Palliative Care Assessment & Plan    Code Status:  Limited code  Goals of Care:  Spoke again with Kellie Velez today.  Updated that really no change today from mental status perspective. Neurology ordering head CT today.  Unclear why she still has degree of encephalopathy.  Again tried to talk to Kellie Velez about decisions going forward and how concerning clinical picture is going forward, even without diagnostic certainty.  Her last 6 months have been in hospitals with steady decline. I do not expect that is likely to change.  She talked today about her brothers opinion that they should "give-up" on Kellie Velez and she struggles with this. However when talking about what prospects may look like for her in future, this is also of course troubling to her as  well.  She tells me she needs more time to think and is hoping she will hear from Neurology tomorrow.  I enforced with her that decisions likely need to be made in coming days and longer encephalopathy goes on the worse that is for Kellie Velez prognostically.  I am off service tomorrow but will have PMT follow-up.    Symptom Management:   Acute Encephalopathy hypoactive delirium. Sedating meds appropriately being held  Psycho-social/Spiritual:   Support System: was living independently in December prior to her 6 month long hospitalization. She is widowed (as of few years ago). She had a pet dog whom her mother is now caregiver for. Her sister Kellie Velez is HCPOA as of 12/24/14. She has several other siblings as well.   Desire for further Chaplaincy support:yes  Prognosis: Unable to determine  Discharge Planning: TBD   Care plan was discussed with Dr Kellie Velez   Thank you for allowing the Palliative Medicine Team to assist in the care of this patient.   Total Time: 25 minutes Greater than 50%  of this time was spent counseling and coordinating care related to the above assessment and plan.   Kellie Brill, DO  01/14/2015, 1:25 PM  Please contact Palliative Medicine Team phone at [phone] for questions and concerns.

## 2015-01-14 NOTE — Progress Notes (Signed)
PULMONARY / CRITICAL CARE MEDICINE   Name: Kellie Velez MRN: [id number] DOB: 1955-10-02    ADMISSION DATE:  01/07/2015 CONSULTATION DATE:  01/14/2015  REFERRING MD :  EDP  CHIEF COMPLAINT:  AMS  INITIAL PRESENTATION:  [name] brought to [facility] ED 5/25 with AMS likely due to hypernatremia as well as aspiration PNA.  In ED, intubated for GCS 3 and PCCM called for admission.  STUDIES:  CXR 5/25 >>> hazy right infrahilar atx or infiltrate. CT head 5/25 >>> no acute process.  Remote right MCA infarct.  Chronic microvascular ischemic changes. 5/26 - eeg>>>generalized non-specific cerebral dysfunction(encephalopathy). There was no seizure  Ct head #2>>>old subacute rt mca  SIGNIFICANT EVENTS: 5/25 - admitted with AMS, probable aspiration PNA, multiple metabolic derangements. 5/26- pupil change, to CT now, fevers, line was placed 01/12/15: Dr Kendrick Fries discussion with sister - > Baseline has been hospitalized since December 2015, started with mental issues, then aspiration pneumonia and recurrent c.diff.  During that time she has had intermittent ability to have conversations which are riddled with delusions and hallucinations.  Sister would like to try to get her off the vent, but she agrees that CPR would be inappropriate (likelihood of meaningful recovery would be very low if she had a cardiac arrest).  Will change code status to limited code blue, continue efforts to get her off the vent this week. Also C Diff positive  01/13/15: Per RN - EEG x 2 is encephalopathy. MRI not possible due to pacer. Last dose of fent 5/25 and versed 5/27-> mostly unrespnsive. Followed 1 command with squeeze yesterday. Still not noticed to move lowers. RUE slightly stronger 2/5 compare to LUE 1/5.  Also rhythmic movement of mouth noted today. RN reports sister DPOA having scheduling issue to make it for family meeting     SUBJECTIVE/OVERNIGHT/INTERVAL HX 01/14/15: Per paliative MD - d/w and review of notes - patient had  abrupt decline in sept and since then hospitalzed mostly in psych unit. Current PPS scale is 20. Neuro noted that she is not following any commands ad not moving left side. Repeat CT head ordered . Stil with mouth movement. Not on pressors. Not on sedation. Did SBT 2h yesterday. No effort today  VITAL SIGNS: Temp:  [97.8 F (36.6 C)-99.3 F (37.4 C)] 98.4 F (36.9 C) (06/01 0905) Pulse Rate:  [42-133] 74 (06/01 1000) Resp:  [16-27] 21 (06/01 1000) BP: (88-124)/(50-65) 107/60 mmHg (06/01 1000) SpO2:  [84 %-100 %] 100 % (06/01 1000) FiO2 (%):  [30 %] 30 % (06/01 0800) Weight:  [185 lb 3 oz (84 kg)] 185 lb 3 oz (84 kg) (06/01 0500) HEMODYNAMICS: CVP:  [1 mmHg-10 mmHg] 10 mmHg VENTILATOR SETTINGS: Vent Mode:  [-] PRVC FiO2 (%):  [30 %] 30 % Set Rate:  [24 bmp] 24 bmp Vt Set:  [530 mL] 530 mL PEEP:  [5 cmH20] 5 cmH20 Pressure Support:  [10 cmH20] 10 cmH20 Plateau Pressure:  [17 cmH20-18 cmH20] 18 cmH20 INTAKE / OUTPUT: Intake/Output      05/31 0701 - 06/01 0700 06/01 0701 - 06/02 0700   I.V. (mL/kg) 210 (2.5) 30 (0.4)   NG/GT 1880    IV Piggyback 310 105   Total Intake(mL/kg) 2400 (28.6) 135 (1.6)   Urine (mL/kg/hr) 1110 (0.6) 75 (0.2)   Stool 200 (0.1)    Total Output 1310 75   Net +1090 +60          PHYSICAL EXAMINATION:  Gen; Critically and Chronically ill appearing,  on vent HENT: NCAT ETT in place PULM: few crackles in bases CV: RRR, no mgr GI: BS+, soft, nontender MSK: normal bulk, tone Derm: edema/anasarca all over, fingers dusky Neuro : moves mouth in rhythmic movement . Not opening eyes. NOt following commands. Spont Rt side movement; nil on left  LABS:  PULMONARY  Recent Labs Lab 01/07/15 1712 01/08/15 0547 01/09/15 0445  PHART 7.316* 7.340* 7.327*  PCO2ART 29.5* 26.7* 28.0*  PO2ART 105.0* 187.0* 205.0*  HCO3 15.0* 14.4* 14.7*  TCO2 O2SAT 98.0 100.0 100.0    CBC  Recent Labs Lab 01/10/15 0340 01/11/15 0305 01/14/15 0445  HGB 9.0*  8.4* 8.1*  HCT 29.9* 27.4* 25.8*  WBC 11.3* 10.1 7.1  PLT 132* 110* 172    COAGULATION  Recent Labs Lab 01/08/15 0610 01/10/15 0340 01/11/15 0305 01/13/15 1215 01/14/15 0445  INR 2.21* 1.48 1.39 1.59* 1.41    CARDIAC  No results for input(s): TROPONINI in the last 168 hours. No results for input(s): PROBNP in the last 168 hours.   CHEMISTRY  Recent Labs Lab 01/07/15 1433  01/08/15 0610  01/09/15 1400  01/10/15 0340 01/11/15 0305 01/12/15 0330 01/13/15 1215 01/14/15 0445  NA  --   < > 150*  < >  --   < > 144 145 146* 146* 142  K  --   < > 3.2*  < >  --   < > 3.1* 3.4* 4.4 3.6 3.2*  CL  --   < > 127*  < >  --   < > 122* 124* 123* 117* 113*  CO2  --   < > 19*  < >  --   < > 15* 16* 19* 22 20*  GLUCOSE  --   < > 161*  < >  --   < > 118* 117* 127* 112* 120*  BUN  --   < > 40*  < >  --   < > 37* 42* 43* 47* 46*  CREATININE  --   < > 1.79*  < >  --   < > 1.45* 1.35* 1.24* 1.09* 1.07*  CALCIUM 12.4*  < > 11.3*  < >  --   < > 11.0* 11.0* 11.7* 11.8* 11.5*  MG  --   --  1.6*  --   --   --   --   --   --   --  1.8  PHOS 2.1*  --  1.0*  --  2.5  --   --   --   --   --  2.5  < > = values in this interval not displayed. Estimated Creatinine Clearance: 63.9 mL/min (by C-G formula based on Cr of 1.07).   LIVER  Recent Labs Lab 01/08/15 0610 01/09/15 0503 01/10/15 0340 01/11/15 0305 01/13/15 1215 01/14/15 0445  AST  --  40 53*  --  17 25  ALT  --  24 36  --  5* <5*  ALKPHOS  --  47 54  --  62 65  BILITOT  --  0.4 0.2*  --  0.4 0.3  PROT  --  4.4* 4.3*  --  4.4* 4.6*  ALBUMIN  --  1.7* 1.5*  --  1.5* 1.5*  INR 2.21*  --  1.48 1.39 1.59* 1.41     INFECTIOUS  Recent Labs Lab 01/07/15 1442 01/08/15 0653  LATICACIDVEN 2.6* 0.98     ENDOCRINE CBG (last 3)   Recent Labs  01/14/15 0034 01/14/15 0435 01/14/15 0900  GLUCAP 104* 106* 124*         IMAGING x48h  - iNo results found.      ASSESSMENT / PLAN:  PULMONARY OETT 5/25 >>> A: VDRF  due to inability to protect airway, MSSA pneumonia, acute pulm edema   01/14/15: Mental status limiting extubation. Failed SBT  P:   SBT daily, CXR in AM VAP bundle See ID  INFECTIOUS BCx2 5/25 > UCx 5/25 > Sputum Cx 5/25 >MSSA 5/29 C diff Positive  A:   Septic shock > resolved MSSA pneumonia C diff, diagnosed 5/29; family says this is the third time in 6 weeks   - not on pressors. Afebrile   P:    Vanc, start date 5/25>>> 5/29 Cefepime, start date 5/25>>>5/27 Cefazolin 5/27>>> ................... Flagyl 5/29 x1 Oral vanc 5/29 > Fidaxomicin 200mg  bid >  NEUROLOGIC A:   Acute metabolic encephalopathy > persistent 5/29, likely multifactorial from sepsis, hypernatremia Hx bipolar 1 disorder > family says recent use of Lithium has been harmful to mental status; Prior to 07/2014 was living independently Hx of CVA CT head neg acute x 2  - 01/14/15: Contnued encephalopathy.  Unable to get MRI due to pacer. Concern for Left sided weakness. Neuro getting CT P:   Sedation:  Fentanyl PRN  D/C  Versed RASS goal: 0 Daily WUA. Continue to hold outpatient benztropine, diazepam, fluphenazine, norco, meclizine, quetiapine, trazodone Maintain keppra Appreciate neuro input  Might call psych  CARDIOVASCULAR A:  Septic shock > resolved Hx HTN, bradycardia s/p PPM, LVH Poor cap refill ext, currently with normal BP   - not on pressors  P:  Monitor BP   RENAL   A:   CKD IV Hypernatremia resolved. Mag and K slightly low  P   Replete mag and k Free water with tube feeds Monitor BMET and UOP Replace electrolytes as needed   GASTROINTESTINAL A:   GI prophylaxis Nutrition P:   SUP: H2 blockade in setting of C Diff Continue TF   HEMATOLOGIC A:   VTE Prophylaxis Coagulopathy > uncertain etiology, improving P:  Monitor for bleeding  ENDOCRINE A:   Hyperglycemia - no hx of DM P:   SSI if glucose consistently > 180   Family updated:  Discussed with sister  Alesia BandaLeanne 5/30;  Interdisciplinary Family Meeting v Palliative Care Meeting:  Due by: 5/31. -Palliative goals of care done by palliative MD on 01/13/15       The patient is critically ill with multiple organ systems failure and requires high complexity decision making for assessment and support, frequent evaluation and titration of therapies, application of advanced monitoring technologies and extensive interpretation of multiple databases.   Critical Care Time devoted to patient care services described in this note is  30  Minutes. This time reflects time of care of this signee Dr Kalman ShanMurali Kyanna Mahrt. This critical care time does not reflect procedure time, or teaching time or supervisory time of PA/NP/Med student/Med Resident etc but could involve care discussion time    Dr. Kalman ShanMurali Binnie Droessler, M.D., [facility].C.C.P Pulmonary and Critical Care Medicine Staff Physician [facility] Pager: [phone], If no answer or between  15:00h - 7:00h: call [phone]  01/14/2015 11:30 AM

## 2015-01-14 NOTE — Progress Notes (Signed)
Subjective: Remains intubated. Follows no commands and only moving right arm and leg spontaneously. Breathing with vent and on no sedation.   Objective: Current vital signs: BP 109/59 mmHg  Pulse 70  Temp(Src) 98.4 F (36.9 C) (Oral)  Resp 24  Ht  (1.753 m)  Wt 84 kg (185 lb 3 oz)  BMI 27.33 kg/m2  SpO2 100% Vital signs in last 24 hours: Temp:  [97.8 F (36.6 C)-99.3 F (37.4 C)] 98.4 F (36.9 C) (06/01 0905) Pulse Rate:  [42-133] 70 (06/01 0700) Resp:  [16-27] 24 (06/01 0700) BP: (88-124)/(47-65) 109/59 mmHg (06/01 0746) SpO2:  [84 %-100 %] 100 % (06/01 0700) FiO2 (%):  [30 %] 30 % (06/01 0746) Weight:  [84 kg (185 lb 3 oz)] 84 kg (185 lb 3 oz) (06/01 0500)  Intake/Output from previous day: 05/31 0701 - 06/01 0700 In: 2400 [I.V.:210; NG/GT:1880; IV Piggyback:310] Out: 1310 [Urine:1110; Stool:200] Intake/Output this shift:   Nutritional status:    Neurologic Exam: Mental Status: Patient does not respond to verbal stimuli. Does respond to deep sternal rub and localizes with right arm. Does not follow commands. No verbalizations noted.  Cranial Nerves: II: patient does respond confrontation bilaterally, pupils right 3 mm, left 3 mm,and reactive bilaterally III,IV,VI: doll's response present bilaterally.  V,VII: corneal reflex present bilaterally  VIII: patient does not respond to verbal stimuli IX,X: gag reflex present, XI: trapezius strength unable to test bilaterally XII: tongue strength unable to test Motor: Extremities flaccid throughout. spontaneous movement noted on the right arm and leg. localizes to sternal rub with right arm Sensory: Does respond to noxious stimuli in periorbital region by wincing and sternal rub. Deep Tendon Reflexes:  Absent throughout. Plantars: absent bilaterally Cerebellar: Unable to perform  Lab Results: Basic Metabolic Panel:  Recent Labs Lab 01/07/15 1433  01/08/15 0610  01/09/15 1400  01/10/15 0340  01/11/15 0305 01/12/15 0330 01/13/15 1215 01/14/15 0445  NA  --   < > 150*  < >  --   < > 144 145 146* 146* 142  K  --   < > 3.2*  < >  --   < > 3.1* 3.4* 4.4 3.6 3.2*  CL  --   < > 127*  < >  --   < > 122* 124* 123* 117* 113*  CO2  --   < > 19*  < >  --   < > 15* 16* 19* 22 20*  GLUCOSE  --   < > 161*  < >  --   < > 118* 117* 127* 112* 120*  BUN  --   < > 40*  < >  --   < > 37* 42* 43* 47* 46*  CREATININE  --   < > 1.79*  < >  --   < > 1.45* 1.35* 1.24* 1.09* 1.07*  CALCIUM 12.4*  < > 11.3*  < >  --   < > 11.0* 11.0* 11.7* 11.8* 11.5*  MG  --   --  1.6*  --   --   --   --   --   --   --  1.8  PHOS 2.1*  --  1.0*  --  2.5  --   --   --   --   --  2.5  < > = values in this interval not displayed.  Liver Function Tests:  Recent Labs Lab 01/07/15 1104 01/09/15 0503 01/10/15 0340 01/13/15 1215 01/14/15 0445  AST 22  40 53* 17 25  ALT 11* 24 36 5* <5*  ALKPHOS 45 47 54 62 65  BILITOT 0.6 0.4 0.2* 0.4 0.3  PROT 5.8* 4.4* 4.3* 4.4* 4.6*  ALBUMIN 2.6* 1.7* 1.5* 1.5* 1.5*   No results for input(s): LIPASE, AMYLASE in the last 168 hours.  Recent Labs Lab 01/12/15 1118  AMMONIA 22    CBC:  Recent Labs Lab 01/07/15 1104 01/08/15 0814 01/09/15 0503 01/10/15 0340 01/11/15 0305 01/14/15 0445  WBC 23.1* 15.5* 17.7* 11.3* 10.1 7.1  NEUTROABS 17.6*  --  14.5*  --  8.1* 4.5  HGB 12.1 8.8* 9.2* 9.0* 8.4* 8.1*  HCT 42.0 29.3* 30.0* 29.9* 27.4* 25.8*  MCV 94.8 90.4 88.2 89.3 88.4 86.6  PLT 194 177 148* 132* 110* 172    Cardiac Enzymes: No results for input(s): CKTOTAL, CKMB, CKMBINDEX, TROPONINI in the last 168 hours.  Lipid Panel: No results for input(s): CHOL, TRIG, HDL, CHOLHDL, VLDL, LDLCALC in the last 168 hours.  CBG:  Recent Labs Lab 01/13/15 1559 01/13/15 1957 01/14/15 0034 01/14/15 0435 01/14/15 0900  GLUCAP 109* 115* 104* 106* 124*    Microbiology: Results for orders placed or performed during the hospital encounter of 01/07/15  Blood culture (routine  x 2)     Status: None   Collection Time: 01/07/15 11:04 AM  Result Value Ref Range Status   Specimen Description BLOOD LEFT ARM  Final   Special Requests BOTTLES DRAWN AEROBIC AND ANAEROBIC 5CC  Final   Culture   Final    NO GROWTH 5 DAYS Performed at [facility]    Report Status 01/13/2015 FINAL  Final  Blood culture (routine x 2)     Status: None   Collection Time: 01/07/15 11:23 AM  Result Value Ref Range Status   Specimen Description BLOOD LEFT ARM  Final   Special Requests   Final    BOTTLES DRAWN AEROBIC AND ANAEROBIC 10CCBLUE 5CCRED   Culture   Final    NO GROWTH 5 DAYS Performed at [facility]    Report Status 01/13/2015 FINAL  Final  Urine culture     Status: None   Collection Time: 01/07/15 11:30 AM  Result Value Ref Range Status   Specimen Description URINE, RANDOM  Final   Special Requests Normal  Final   Colony Count NO GROWTH Performed at [facility]   Final   Culture NO GROWTH Performed at [facility]   Final   Report Status 01/08/2015 FINAL  Final  Culture, respiratory (NON-Expectorated)     Status: None   Collection Time: 01/07/15  4:13 PM  Result Value Ref Range Status   Specimen Description TRACHEAL ASPIRATE  Final   Special Requests NONE  Final   Gram Stain   Final    ABUNDANT WBC PRESENT, PREDOMINANTLY PMN NO SQUAMOUS EPITHELIAL CELLS SEEN ABUNDANT GRAM POSITIVE COCCI IN CLUSTERS Performed at [facility]    Culture   Final    ABUNDANT STAPHYLOCOCCUS AUREUS Note: RIFAMPIN AND GENTAMICIN SHOULD NOT BE USED AS SINGLE DRUGS FOR TREATMENT OF STAPH INFECTIONS. Performed at [facility]    Report Status 01/10/2015 FINAL  Final   Organism ID, Bacteria STAPHYLOCOCCUS AUREUS  Final      Susceptibility   Staphylococcus aureus - MIC*    CLINDAMYCIN <=0.25 SENSITIVE Sensitive     ERYTHROMYCIN <=0.25 SENSITIVE Sensitive     GENTAMICIN <=0.5 SENSITIVE Sensitive     LEVOFLOXACIN 0.25 SENSITIVE Sensitive  OXACILLIN <=0.25 SENSITIVE Sensitive     PENICILLIN 0.06 SENSITIVE Sensitive     RIFAMPIN <=0.5 SENSITIVE Sensitive     TRIMETH/SULFA <=10 SENSITIVE Sensitive     VANCOMYCIN 1 SENSITIVE Sensitive     TETRACYCLINE <=1 SENSITIVE Sensitive     MOXIFLOXACIN <=0.25 SENSITIVE Sensitive     * ABUNDANT STAPHYLOCOCCUS AUREUS  MRSA PCR Screening     Status: None   Collection Time: 01/07/15  4:13 PM  Result Value Ref Range Status   MRSA by PCR NEGATIVE NEGATIVE Final    Comment:        The GeneXpert MRSA Assay (FDA approved for NASAL specimens only), is one component of a comprehensive MRSA colonization surveillance program. It is not intended to diagnose MRSA infection nor to guide or monitor treatment for MRSA infections.   Clostridium Difficile by PCR     Status: Abnormal   Collection Time: 01/11/15  1:44 PM  Result Value Ref Range Status   C difficile by pcr POSITIVE (A) NEGATIVE Final    Comment: CRITICAL RESULT CALLED TO, READ BACK BY AND VERIFIED WITH: ASATSAWO,A RN 01/11/15 1745 WOOTEN,K     Coagulation Studies:  Recent Labs  01/13/15 1215 01/14/15 0445  LABPROT 19.0* 17.4*  INR 1.59* 1.41    Imaging: No results found.  Medications:  Scheduled: . antiseptic oral rinse  7 mL Mouth Rinse QID  .  ceFAZolin (ANCEF) IV  1 g Intravenous 3 times per day  . chlorhexidine  15 mL Mouth Rinse BID  . famotidine  20 mg Per Tube BID  . feeding supplement (PRO-STAT SUGAR FREE 64)  30 mL Per Tube BID  . fidaxomicin  200 mg Oral BID  . free water  200 mL Per Tube Q4H  . heparin  5,000 Units Subcutaneous 3 times per day  . levETIRAcetam  500 mg Intravenous Q12H  . metoCLOPramide (REGLAN) injection  5 mg Intravenous Q12H  . vancomycin  500 mg Oral 4 times per day    Assessment/Plan:  59y/o woman with history of prior R MCA infarct, HTN, NH resident admitted on 5/25 with altered mental status initially though secondary to hypernatremia (Na 160) and sepsis from aspiration  PNA. Hypernatremia has resolved and currently being treated for C. Diff.EEG shows no electrographic seizure activity. moving right arm and leg spontaneously and localizing to sternal rub with right arm today.  Still no movement noted with left arm or leg.    Recommend: 1) Repeat CT scan of the head without contrast 2) Continue to treat underlying infectious and metabolic issues. --per primary team.     Felicie Mornavid Smith PA-C [facility] [phone]  01/14/2015, 9:19 AM  I personally participated in this patient's evaluation and management, including formulating the above clinical impression and management recommendations.  Venetia MaxonR Rodriques Badie M.D. [facility] [phone]

## 2015-01-14 NOTE — Care Management Note (Addendum)
Case Management Note  Patient Details  Name: Kellie Velez MRN: [id number] Date of Birth: 01/28/1956  Subjective/Objective:      Continues on Vent at present              Action/Plan: Anticipate SNF when ready for discharge   Expected Discharge Date:                  Expected Discharge Plan:  Skilled Nursing Facility  In-House Referral:  Clinical Social Work  Discharge planning Services  CM Consult  Post Acute Care Choice:    Choice offered to:     DME Arranged:    DME Agency:     HH Arranged:    HH Agency:     Status of Service:  In process, will continue to follow  Medicare Important Message Given:    Date Medicare IM Given:    Medicare IM give by:    Date Additional Medicare IM Given:    Additional Medicare Important Message give by:     If discussed at Long Length of Stay Meetings, dates discussed:    Additional Comments:  Yvone NeuCrutchfield, Kailash Hinze M, RN 01/14/2015, 2:57 PM

## 2015-01-14 NOTE — Progress Notes (Signed)
Patient transported to CT and back to room 2M11 without any complications. 

## 2015-01-15 ENCOUNTER — Encounter (HOSPITAL_COMMUNITY): Payer: Self-pay | Admitting: *Deleted

## 2015-01-15 LAB — CBC WITH DIFFERENTIAL/PLATELET
Basophils Absolute: 0.1 10*3/uL (ref 0.0–0.1)
Basophils Relative: 1 % (ref 0–1)
Eosinophils Absolute: 0.2 10*3/uL (ref 0.0–0.7)
Eosinophils Relative: 2 % (ref 0–5)
HCT: 25.1 % — ABNORMAL LOW (ref 36.0–46.0)
HEMOGLOBIN: 7.8 g/dL — AB (ref 12.0–15.0)
LYMPHS ABS: 1.8 10*3/uL (ref 0.7–4.0)
LYMPHS PCT: 19 % (ref 12–46)
MCH: 27.3 pg (ref 26.0–34.0)
MCHC: 31.1 g/dL (ref 30.0–36.0)
MCV: 87.8 fL (ref 78.0–100.0)
Monocytes Absolute: 0.8 10*3/uL (ref 0.1–1.0)
Monocytes Relative: 9 % (ref 3–12)
Neutro Abs: 6.5 10*3/uL (ref 1.7–7.7)
Neutrophils Relative %: 69 % (ref 43–77)
PLATELETS: 175 10*3/uL (ref 150–400)
RBC: 2.86 MIL/uL — ABNORMAL LOW (ref 3.87–5.11)
RDW: 20.6 % — AB (ref 11.5–15.5)
WBC: 9.4 10*3/uL (ref 4.0–10.5)

## 2015-01-15 LAB — GLUCOSE, CAPILLARY
GLUCOSE-CAPILLARY: 97 mg/dL (ref 65–99)
GLUCOSE-CAPILLARY: 117 mg/dL — AB (ref 65–99)
GLUCOSE-CAPILLARY: 92 mg/dL (ref 65–99)
Glucose-Capillary: 112 mg/dL — ABNORMAL HIGH (ref 65–99)
Glucose-Capillary: 91 mg/dL (ref 65–99)
Glucose-Capillary: 97 mg/dL (ref 65–99)
Glucose-Capillary: 98 mg/dL (ref 65–99)

## 2015-01-15 LAB — BASIC METABOLIC PANEL
Anion gap: 9 (ref 5–15)
BUN: 49 mg/dL — ABNORMAL HIGH (ref 6–20)
CHLORIDE: 114 mmol/L — AB (ref 101–111)
CO2: 19 mmol/L — AB (ref 22–32)
Calcium: 11.4 mg/dL — ABNORMAL HIGH (ref 8.9–10.3)
Creatinine, Ser: 0.98 mg/dL (ref 0.44–1.00)
GFR calc Af Amer: >60 mL/min (ref >60)
GLUCOSE: 105 mg/dL — AB (ref 65–99)
Potassium: 4.1 mmol/L (ref 3.5–5.1)
SODIUM: 142 mmol/L (ref 135–145)

## 2015-01-15 LAB — PHOSPHORUS: Phosphorus: 3.1 mg/dL (ref 2.5–4.6)

## 2015-01-15 LAB — MAGNESIUM: MAGNESIUM: 2.3 mg/dL (ref 1.7–2.4)

## 2015-01-15 MED ORDER — PNEUMOCOCCAL VAC POLYVALENT 25 MCG/0.5ML IJ INJ
0.5000 mL | INJECTION | INTRAMUSCULAR | Status: AC
  Administered 2015-01-16: 0.5 mL via INTRAMUSCULAR
  Filled 2015-01-15: qty 0.5

## 2015-01-15 MED ORDER — VANCOMYCIN 50 MG/ML ORAL SOLUTION
125.0000 mg | Freq: Four times a day (QID) | ORAL | Status: AC
  Administered 2015-01-15 – 2015-01-26 (×40): 125 mg via ORAL
  Filled 2015-01-15 (×46): qty 2.5

## 2015-01-15 NOTE — Consult Note (Signed)
WOC consult performed 01/13/15, verified with bedside nurse that no new changes noted no additional needs for re-consultation at this time.   Discussed POC with patient and bedside nurse.  Re consult if needed, will not follow at this time. Thanks  Cato Liburd [employer] RN, CWOCN [phone])

## 2015-01-15 NOTE — Progress Notes (Signed)
PULMONARY / CRITICAL CARE MEDICINE   Name: Kellie Velez MRN: [id number] DOB: November 09, 1955    ADMISSION DATE:  01/07/2015 CONSULTATION DATE:  01/15/2015 LOS 8  REFERRING MD :  EDP  CHIEF COMPLAINT:  AMS  INITIAL PRESENTATION:  [name] brought to [facility] ED 5/25 with AMS likely due to hypernatremia as well as aspiration PNA.  In ED, intubated for GCS 3 and PCCM called for admission.  STUDIES:  CXR 5/25 >>> hazy right infrahilar atx or infiltrate. CT head 5/25 >>> no acute process.  Remote right MCA infarct.  Chronic microvascular ischemic changes. 5/26 - eeg>>>generalized non-specific cerebral dysfunction(encephalopathy). There was no seizure  Ct head #2>>>old subacute rt mca  SIGNIFICANT EVENTS: 5/25 - admitted with AMS, probable aspiration PNA, (cutlures ultimately grew MSSA) multiple metabolic derangements. 5/26- pupil change, to CT now, fevers, line was placed 01/12/15: Dr Kendrick Fries discussion with sister - > Baseline has been hospitalized since December 2015, started with mental issues, then aspiration pneumonia and recurrent c.diff.  During that time she has had intermittent ability to have conversations which are riddled with delusions and hallucinations.  Sister would like to try to get her off the vent, but she agrees that CPR would be inappropriate (likelihood of meaningful recovery would be very low if she had a cardiac arrest).  Will change code status to limited code blue, continue efforts to get her off the vent this week. Also C Diff positive (reportedly 3rd episode)  01/13/15: Per RN - EEG x 2 is encephalopathy. MRI not possible due to pacer. Last dose of fent 5/25 and versed 5/27-> mostly unrespnsive. Followed 1 command with squeeze yesterday. Still not noticed to move lowers. RUE slightly stronger 2/5 compare to LUE 1/5.  Also rhythmic movement of mouth noted today. RN reports sister DPOA having scheduling issue to make it for family meeting   01/14/15: Per paliative MD - d/w and  review of notes - patient had abrupt decline in sept and since then hospitalzed mostly in psych unit. Current PPS scale is 20. Neuro noted that she is not following any commands ad not moving left side. Repeat CT head ordered . Stil with mouth movement. Not on pressors. Not on sedation. Did SBT 2h yesterday. No effort today    SUBJECTIVE/OVERNIGHT/INTERVAL HX 01/15/2015: CT head with subacute R MCA infarct unchanged fro a week earlier - this is c/w left side weakness. In addition chroinic cerebellar and L MCA infarct similar to 2013. She halso has left mastoid effusion. Appears more comatose today and mouth movement has disappeared. Not gotten sedation in days. 7d complete for MSSA   VITAL SIGNS: Temp:  [97.5 F (36.4 C)-98.2 F (36.8 C)] 98.2 F (36.8 C) (06/02 0400) Pulse Rate:  [61-84] 76 (06/02 0800) Resp:  [17-30] 30 (06/02 0800) BP: (77-121)/(40-77) 101/42 mmHg (06/02 0800) SpO2:  [92 %-100 %] 100 % (06/02 0800) FiO2 (%):  [30 %] 30 % (06/02 0743) Weight:  [80 kg (176 lb 5.9 oz)-83 kg (182 lb 15.7 oz)] 83 kg (182 lb 15.7 oz) (06/02 0400) HEMODYNAMICS: CVP:  [4 mmHg-11 mmHg] 6 mmHg VENTILATOR SETTINGS: Vent Mode:  [-] CPAP;PSV FiO2 (%):  [30 %] 30 % Set Rate:  [24 bmp] 24 bmp Vt Set:  [530 mL] 530 mL PEEP:  [5 cmH20] 5 cmH20 Pressure Support:  [10 cmH20] 10 cmH20 Plateau Pressure:  [17 cmH20-20 cmH20] 20 cmH20 INTAKE / OUTPUT: Intake/Output      06/01 0701 - 06/02 0700 06/02 0701 - 06/03  0700   I.V. (mL/kg) 232.5 (2.8) 10 (0.1)   NG/GT 2910 50   IV Piggyback 560 100   Total Intake(mL/kg) 3702.5 (44.6) 160 (1.9)   Urine (mL/kg/hr) 1051 (0.5) 20 (0.1)   Stool 100 (0.1)    Total Output 1151 20   Net +2551.5 +140        Stool Occurrence 1 x      PHYSICAL EXAMINATION:  Gen; Critically and Chronically ill appearing, on vent HENT: NCAT ETT in place PULM: sync with vent CV: RRR, no mgr GI: BS+, soft, nontender MSK: normal bulk, tone Derm: edema/anasarca all over,  fingers dusky Neuro : No longer moves mouth in rhythmic movement . Not opening eyes. NOt following commands.  No spont movement. RASS -4  LABS:  PULMONARY  Recent Labs Lab 01/09/15 0445  PHART 7.327*  PCO2ART 28.0*  PO2ART 205.0*  HCO3 14.7*  TCO2 16  O2SAT 100.0    CBC  Recent Labs Lab 01/11/15 0305 01/14/15 0445 01/15/15 0440  HGB 8.4* 8.1* 7.8*  HCT 27.4* 25.8* 25.1*  WBC 10.1 7.1 9.4  PLT 110* 172 175    COAGULATION  Recent Labs Lab 01/10/15 0340 01/11/15 0305 01/13/15 1215 01/14/15 0445  INR 1.48 1.39 1.59* 1.41    CARDIAC  No results for input(s): TROPONINI in the last 168 hours. No results for input(s): PROBNP in the last 168 hours.   CHEMISTRY  Recent Labs Lab 01/09/15 1400  01/11/15 0305 01/12/15 0330 01/13/15 1215 01/14/15 0445 01/15/15 0440  NA  --   < > 145 146* 146* 142 142  K  --   < > 3.4* 4.4 3.6 3.2* 4.1  CL  --   < > 124* 123* 117* 113* 114*  CO2  --   < > 16* 19* 22 20* 19*  GLUCOSE  --   < > 117* 127* 112* 120* 105*  BUN  --   < > 42* 43* 47* 46* 49*  CREATININE  --   < > 1.35* 1.24* 1.09* 1.07* 0.98  CALCIUM  --   < > 11.0* 11.7* 11.8* 11.5* 11.4*  MG  --   --   --   --   --  1.8 2.3  PHOS 2.5  --   --   --   --  2.5 3.1  < > = values in this interval not displayed. Estimated Creatinine Clearance: 69.4 mL/min (by C-G formula based on Cr of 0.98).   LIVER  Recent Labs Lab 01/09/15 0503 01/10/15 0340 01/11/15 0305 01/13/15 1215 01/14/15 0445  AST 40 53*  --  17 25  ALT 24 36  --  5* <5*  ALKPHOS 47 54  --  62 65  BILITOT 0.4 0.2*  --  0.4 0.3  PROT 4.4* 4.3*  --  4.4* 4.6*  ALBUMIN 1.7* 1.5*  --  1.5* 1.5*  INR  --  1.48 1.39 1.59* 1.41     INFECTIOUS No results for input(s): LATICACIDVEN, PROCALCITON in the last 168 hours.   ENDOCRINE CBG (last 3)   Recent Labs  01/14/15 2354 01/15/15 0350 01/15/15 0819  GLUCAP 91 98 112*         IMAGING x48h  - iCt Head Wo Contrast  01/14/2015   CLINICAL  DATA:  59 year old female with continued encephalopathy. Intubated. Initial encounter.  EXAM: CT HEAD WITHOUT CONTRAST  TECHNIQUE: Contiguous axial images were obtained from the base of the skull through the vertex without intravenous contrast.  COMPARISON:  01/08/2015 and earlier.  FINDINGS: Partially visible endotracheal and oral enteric tubes. Stable left maxillary sinus mucous retention cyst. Left tympanic and mastoid opacification appears increased. Small volume layering fluid in the pharynx.  No acute osseous abnormality identified. No acute orbit or scalp soft tissue findings.  Calcified atherosclerosis at the skull base. Compared to 01/07/2015, abnormal right MCA territory hypodensity has not changed. This is a new finding compared to 2013. No associated mass effect is evident. No associated hemorrhage. No ventriculomegaly. Patchy hypodensity in the left MCA white matter also appears stable, and was partially present in 2013. Chronic left cerebellar infarct is unchanged since 2013. No suspicious intracranial vascular hyperdensity. No acute intracranial hemorrhage identified.  IMPRESSION: 1. Subacute versus chronic right greater than left MCA territory ischemia appears unchanged since 01/07/2015. Some of the left MCA findings were present in 2013. No associated hemorrhage or mass effect. 2. Chronic left cerebellar infarct is stable since 2013. 3. Left mastoid and tympanic effusion likely secondary to intubation.   Electronically Signed   By: Odessa Fleming M.D.   On: 01/14/2015 14:56        ASSESSMENT / PLAN:  PULMONARY OETT 5/25 >>> A: VDRF due to inability to protect airway, MSSA pneumonia, acute pulm edema   01/15/15: Mental status limiting extubation. Failed SBT  P:   SBT based on criteria CXR in AM VAP bundle See ID  INFECTIOUS BCx2 5/25 > UCx 5/25 > Sputum Cx 5/25 >MSSA 5/29 C diff Positive  A:   Septic shock > resolved MSSA pneumonia 01/07/15 C diff, diagnosed 5/29; family says  this is the third time in 6 weeks   - not on pressors. Afebrile. Completed 7d Ancef for MSSA. Pharmacists recommending lower dose of vanc for C diff   P:    Vanc, start date 5/25>>> 5/29 Cefepime, start date 5/25>>>5/27 Cefazolin 5/27>>> ................... Flagyl 5/29 x1 Oral vanc higher dose 500mg  q6h 5/29 > 01/15/15, 01/15/15 (lower dose of 125mg  q6h)  >> (14 days) Fidaxomicin 200mg  bid > (14 days)  NEUROLOGIC A:   Acute metabolic encephalopathy > persistent 5/29, likely multifactorial from sepsis, hypernatremia Hx bipolar 1 disorder > family says recent use of Lithium has been harmful to mental status; Prior to 07/2014 was living independently Hx of CVA CT head neg acute x 2  - 01/15/15: Seems worse. Comatose. RASS -4  Also CT with left mastoid effsuion  P:   DC reglan  ENT consult for mastoid effusion Sedation:  DC Fentanyl PRN  And D/C  Versed RASS goal: 0 Daily WUA. Continue to hold outpatient benztropine, diazepam, fluphenazine, norco, meclizine, quetiapine, trazodone Maintain keppra Appreciate neuro input  Dc calling psych  CARDIOVASCULAR A:  Septic shock > resolved Hx HTN, bradycardia s/p PPM, LVH Poor cap refill ext, currently with normal BP   - not on pressors  P:  Monitor BP   RENAL   A:   CKD IV Hypernatremia resolved.   P   Free water with tube feeds Monitor BMET and UOP Replace electrolytes as needed   GASTROINTESTINAL A:   GI prophylaxis Nutrition  TF residuals improved  P:   SUP: H2 blockade in setting of C Diff Continue TF DC reglan   HEMATOLOGIC A:   VTE Prophylaxis Coagulopathy > uncertain etiology, improving P:  Monitor for bleeding  ENDOCRINE A:   Hyperglycemia - no hx of DM P:   SSI if glucose consistently > 180   Family updated: PCCM MD  Discussed with sister Alesia Banda 5/30.  None at bedside 01/15/15 Interdisciplinary Family Meeting v Palliative Care Meeting:  Due by: 5/31. -Palliative goals of care done by palliative  MD on 01/13/15    GOBAL Neuro worse. Await neuro input   The patient is critically ill with multiple organ systems failure and requires high complexity decision making for assessment and support, frequent evaluation and titration of therapies, application of advanced monitoring technologies and extensive interpretation of multiple databases.   Critical Care Time devoted to patient care services described in this note is  30  Minutes. This time reflects time of care of this signee Dr Kalman ShanMurali Darryon Bastin. This critical care time does not reflect procedure time, or teaching time or supervisory time of PA/NP/Med student/Med Resident etc but could involve care discussion time    Dr. Kalman ShanMurali Asianna Brundage, M.D., [facility] CenterF.C.C.P Pulmonary and Critical Care Medicine Staff Physician [facility] Pager: [phone], If no answer or between  15:00h - 7:00h: call [phone]  01/15/2015 9:20 AM

## 2015-01-15 NOTE — Progress Notes (Signed)
Subjective: No purposeful responses reported. No adverse overnight events reported.  Objective: Current vital signs: BP 100/59 mmHg  Pulse 72  Temp(Src) 98.2 F (36.8 C) (Oral)  Resp 23  Ht 5\' 9"  (1.753 m)  Wt 83 kg (182 lb 15.7 oz)  BMI 27.01 kg/m2  SpO2 100%  Neurologic Exam: Patient remains intubated and on mechanical ventilation. She responds minimally with movement of right upper extremity and partial eye-opening with tactile stimuli. She has no purposeful movements. Pupils were equal and reacted normally to light. Extractor movements were intact with oculocephalic maneuvers. Face was symmetrical with no focal weakness. Muscle tone was flaccid throughout. She had spontaneous movements of upper extremities, right greater than left, and appeared to have greater strength involving right upper extremity. She had no movement of lower extremities, including to noxious stimuli. Deep tendon reflexes were 2+ in upper extremities and trace to 1+ at the knees. Plantar responses were mute bilaterally.  CT scan of the head on 01/14/2015 showed subacute versus chronic right greater than left MCA territory infarctions. He was no change from previous study on 01/07/2015. Chronic left cerebellar infarction was unchanged from 2013. No acute abnormality was noted.  Medications: I have reviewed the patient's current medications.  Assessment/Plan: Continue encephalopathic state, most likely secondary to multiple factors, including sepsis and aspiration pneumonia, as well as previous bilateral MCA territory strokes. Hyponatremia has resolved. Patient's level of alertness has not changed, however. Repeat EEG on 01/12/2015 showed findings nonspecific findings consistent with diffuse encephalopathy. No seizure activity was noted. CT scan showed no acute intracranial abnormality.  Recommendations: No changes in current management recommended. No further neurodiagnostic studies are indicated at this  point.  We will continue to follow this patient with you.   C.R. Roseanne RenoStewart, MD [facility] [phone]  01/15/2015  10:15 AM

## 2015-01-15 NOTE — Care Management Note (Addendum)
Case Management Note  Patient Details  Name: Nita SellsMary E Haste MRN: [id number] Date of Birth: 11/21/1955  Subjective/Objective:        Continues on Vent. Conversations with family continue re: Goals of care            Action/Plan: Will continue to follow   Expected Discharge Date:                  Expected Discharge Plan:  Skilled Nursing Facility  In-House Referral:  Clinical Social Work  Discharge planning Services  CM Consult  Post Acute Care Choice:    Choice offered to:     DME Arranged:    DME Agency:     HH Arranged:    HH Agency:     Status of Service:  In process, will continue to follow  Medicare Important Message Given:    Date Medicare IM Given:    Medicare IM give by:    Date Additional Medicare IM Given:    Additional Medicare Important Message give by:     If discussed at Long Length of Stay Meetings, dates discussed:  01/15/2015  Additional Comments:  Yvone Neurutchfield, Kadence Mikkelson M, RN 01/15/2015, 3:12 PM

## 2015-01-15 NOTE — Consult Note (Signed)
Reason for Consult: Left mastoid effusion Referring Physician: CCM  Kellie Velez is an 58 y.o. female.  HPI: 59 year old female with extensive psychiatric history who presented to the ER by EMS on 5/25 with mental status changes requiring intubation and was found to have staph aureus pneumonia.  She has been treated but remains intubated with poor improvement in mental status.  A small stroke was diagnosed but does not seem to account for her mental status changes.  On a head CT yesterday, a left-sided mastoid effusion was noted and question arose as to whether this may be contributing to her mental status changes.    Past Medical History  Diagnosis Date  . Arthritis   . Hypertension   . Nerve damage   . Osteoporosis   . Hearing loss   . Abdominal pain   . Stroke     TIA's  . Bradycardia, sinus, persistent, severe   . 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 Bipolar 1 disorder   . Heart murmur 08/13/2012    EF 60-65%,severe LVH,Mild AOV regurg, MR,trivial TR  . Mild carotid artery disease 05/05/2005,08/13/2012    left ICA 0-49% diameter reduction 2006; bilateral fibrous plaque mild 2013  . LVH (left ventricular hypertrophy)     Past Surgical History  Procedure Laterality Date  . Cholecystectomy  1980  . Appendectomy  1983  . Total knee arthroplasty  2003    left  . Permanent pacemaker insertion  01/01/2005    Medtronic EnRhythm  . Permanent pacemaker generator change  06/08/2011    Medtronic Adapta  . Tee without cardioversion  01/06/2005    no cardiac source of embolus    History reviewed. No pertinent family history.  Social History:  reports that she has been smoking Cigarettes.  She has been smoking about 0.50 packs per day. She does not have any smokeless tobacco history on file. She reports that she does not drink alcohol or use illicit drugs.  Allergies:  Allergies  Allergen Reactions  . Floxin  [Ofloxacin] Nausea And Vomiting and Rash  . Plavix [Clopidogrel Bisulfate]     "stomach pain"  . Epinephrine Other (See Comments)    "makes her feel excited"    Medications: I have reviewed the patient's current medications.  Results for orders placed or performed during the hospital encounter of 01/07/15 (from the past 48 hour(s))  Glucose, capillary     Status: Abnormal   Collection Time: 01/13/15  7:57 PM  Result Value Ref Range   Glucose-Capillary 115 (H) 65 - 99 mg/dL  Glucose, capillary     Status: Abnormal   Collection Time: 01/14/15 12:34 AM  Result Value Ref Range   Glucose-Capillary 104 (H) 65 - 99 mg/dL  Glucose, capillary     Status: Abnormal   Collection Time: 01/14/15  4:35 AM  Result Value Ref Range   Glucose-Capillary 106 (H) 65 - 99 mg/dL  Basic metabolic panel     Status: Abnormal   Collection Time: 01/14/15  4:45 AM  Result Value Ref Range   Sodium 142 135 - 145 mmol/L   Potassium 3.2 (L) 3.5 - 5.1 mmol/L   Chloride 113 (H) 101 - 111 mmol/L   CO2 20 (L) 22 - 32 mmol/L   Glucose, Bld 120 (H) 65 - 99 mg/dL   BUN 46 (H) 6 - 20 mg/dL   Creatinine, Ser 1.07 (H) 0.44 - 1.00 mg/dL   Calcium 11.5 (  H) 8.9 - 10.3 mg/dL   GFR calc non Af Amer 55 (L) >60 mL/min   GFR calc Af Amer >60 >60 mL/min    Comment: (NOTE) The eGFR has been calculated using the CKD EPI equation. This calculation has not been validated in all clinical situations. eGFR's persistently <60 mL/min signify possible Chronic Kidney Disease.    Anion gap 9 5 - 15  Magnesium     Status: None   Collection Time: 01/14/15  4:45 AM  Result Value Ref Range   Magnesium 1.8 1.7 - 2.4 mg/dL  Phosphorus     Status: None   Collection Time: 01/14/15  4:45 AM  Result Value Ref Range   Phosphorus 2.5 2.5 - 4.6 mg/dL  CBC with Differential/Platelet     Status: Abnormal   Collection Time: 01/14/15  4:45 AM  Result Value Ref Range   WBC 7.1 4.0 - 10.5 K/uL   RBC 2.98 (L) 3.87 - 5.11 MIL/uL   Hemoglobin 8.1  (L) 12.0 - 15.0 g/dL   HCT 25.8 (L) 36.0 - 46.0 %   MCV 86.6 78.0 - 100.0 fL   MCH 27.2 26.0 - 34.0 pg   MCHC 31.4 30.0 - 36.0 g/dL   RDW 20.3 (H) 11.5 - 15.5 %   Platelets 172 150 - 400 K/uL   Neutrophils Relative % 64 43 - 77 %   Lymphocytes Relative 22 12 - 46 %   Monocytes Relative 11 3 - 12 %   Eosinophils Relative 2 0 - 5 %   Basophils Relative 1 0 - 1 %   Neutro Abs 4.5 1.7 - 7.7 K/uL   Lymphs Abs 1.6 0.7 - 4.0 K/uL   Monocytes Absolute 0.8 0.1 - 1.0 K/uL   Eosinophils Absolute 0.1 0.0 - 0.7 K/uL   Basophils Absolute 0.1 0.0 - 0.1 K/uL   WBC Morphology INCREASED BANDS (>20% BANDS)     Comment: MILD LEFT SHIFT (1-5% METAS, OCC MYELO, OCC BANDS) TOXIC GRANULATION   Hepatic function panel     Status: Abnormal   Collection Time: 01/14/15  4:45 AM  Result Value Ref Range   Total Protein 4.6 (L) 6.5 - 8.1 g/dL   Albumin 1.5 (L) 3.5 - 5.0 g/dL   AST 25 15 - 41 U/L   ALT <5 (L) 14 - 54 U/L   Alkaline Phosphatase 65 38 - 126 U/L   Total Bilirubin 0.3 0.3 - 1.2 mg/dL   Bilirubin, Direct <0.1 (L) 0.1 - 0.5 mg/dL   Indirect Bilirubin NOT CALCULATED 0.3 - 0.9 mg/dL  Protime-INR     Status: Abnormal   Collection Time: 01/14/15  4:45 AM  Result Value Ref Range   Prothrombin Time 17.4 (H) 11.6 - 15.2 seconds   INR 1.41 0.00 - 1.49  Glucose, capillary     Status: Abnormal   Collection Time: 01/14/15  9:00 AM  Result Value Ref Range   Glucose-Capillary 124 (H) 65 - 99 mg/dL  Glucose, capillary     Status: Abnormal   Collection Time: 01/14/15 12:01 PM  Result Value Ref Range   Glucose-Capillary 131 (H) 65 - 99 mg/dL  Glucose, capillary     Status: Abnormal   Collection Time: 01/14/15  4:13 PM  Result Value Ref Range   Glucose-Capillary 134 (H) 65 - 99 mg/dL  Glucose, capillary     Status: None   Collection Time: 01/14/15  7:54 PM  Result Value Ref Range   Glucose-Capillary 97 65 - 99 mg/dL  Glucose, capillary     Status: None   Collection Time: 01/14/15 11:54 PM  Result  Value Ref Range   Glucose-Capillary 91 65 - 99 mg/dL  Glucose, capillary     Status: None   Collection Time: 01/15/15  3:50 AM  Result Value Ref Range   Glucose-Capillary 98 65 - 99 mg/dL  Basic metabolic panel     Status: Abnormal   Collection Time: 01/15/15  4:40 AM  Result Value Ref Range   Sodium 142 135 - 145 mmol/L   Potassium 4.1 3.5 - 5.1 mmol/L    Comment: DELTA CHECK NOTED   Chloride 114 (H) 101 - 111 mmol/L   CO2 19 (L) 22 - 32 mmol/L   Glucose, Bld 105 (H) 65 - 99 mg/dL   BUN 49 (H) 6 - 20 mg/dL   Creatinine, Ser 0.98 0.44 - 1.00 mg/dL   Calcium 11.4 (H) 8.9 - 10.3 mg/dL   GFR calc non Af Amer >60 >60 mL/min   GFR calc Af Amer >60 >60 mL/min    Comment: (NOTE) The eGFR has been calculated using the CKD EPI equation. This calculation has not been validated in all clinical situations. eGFR's persistently <60 mL/min signify possible Chronic Kidney Disease.    Anion gap 9 5 - 15  Magnesium     Status: None   Collection Time: 01/15/15  4:40 AM  Result Value Ref Range   Magnesium 2.3 1.7 - 2.4 mg/dL  Phosphorus     Status: None   Collection Time: 01/15/15  4:40 AM  Result Value Ref Range   Phosphorus 3.1 2.5 - 4.6 mg/dL  CBC with Differential/Platelet     Status: Abnormal   Collection Time: 01/15/15  4:40 AM  Result Value Ref Range   WBC 9.4 4.0 - 10.5 K/uL   RBC 2.86 (L) 3.87 - 5.11 MIL/uL   Hemoglobin 7.8 (L) 12.0 - 15.0 g/dL   HCT 25.1 (L) 36.0 - 46.0 %   MCV 87.8 78.0 - 100.0 fL   MCH 27.3 26.0 - 34.0 pg   MCHC 31.1 30.0 - 36.0 g/dL   RDW 20.6 (H) 11.5 - 15.5 %   Platelets 175 150 - 400 K/uL   Neutrophils Relative % 69 43 - 77 %   Lymphocytes Relative 19 12 - 46 %   Monocytes Relative 9 3 - 12 %   Eosinophils Relative 2 0 - 5 %   Basophils Relative 1 0 - 1 %   Neutro Abs 6.5 1.7 - 7.7 K/uL   Lymphs Abs 1.8 0.7 - 4.0 K/uL   Monocytes Absolute 0.8 0.1 - 1.0 K/uL   Eosinophils Absolute 0.2 0.0 - 0.7 K/uL   Basophils Absolute 0.1 0.0 - 0.1 K/uL   RBC  Morphology POLYCHROMASIA PRESENT    WBC Morphology MILD LEFT SHIFT (1-5% METAS, OCC MYELO, OCC BANDS)     Comment: TOXIC GRANULATION  Glucose, capillary     Status: Abnormal   Collection Time: 01/15/15  8:19 AM  Result Value Ref Range   Glucose-Capillary 112 (H) 65 - 99 mg/dL  Glucose, capillary     Status: Abnormal   Collection Time: 01/15/15 12:31 PM  Result Value Ref Range   Glucose-Capillary 117 (H) 65 - 99 mg/dL  Glucose, capillary     Status: None   Collection Time: 01/15/15  4:26 PM  Result Value Ref Range   Glucose-Capillary 97 65 - 99 mg/dL    Ct Head Wo Contrast  01/14/2015  CLINICAL DATA:  59 year old female with continued encephalopathy. Intubated. Initial encounter.  EXAM: CT HEAD WITHOUT CONTRAST  TECHNIQUE: Contiguous axial images were obtained from the base of the skull through the vertex without intravenous contrast.  COMPARISON:  01/08/2015 and earlier.  FINDINGS: Partially visible endotracheal and oral enteric tubes. Stable left maxillary sinus mucous retention cyst. Left tympanic and mastoid opacification appears increased. Small volume layering fluid in the pharynx.  No acute osseous abnormality identified. No acute orbit or scalp soft tissue findings.  Calcified atherosclerosis at the skull base. Compared to 01/07/2015, abnormal right MCA territory hypodensity has not changed. This is a new finding compared to 2013. No associated mass effect is evident. No associated hemorrhage. No ventriculomegaly. Patchy hypodensity in the left MCA white matter also appears stable, and was partially present in 2013. Chronic left cerebellar infarct is unchanged since 2013. No suspicious intracranial vascular hyperdensity. No acute intracranial hemorrhage identified.  IMPRESSION: 1. Subacute versus chronic right greater than left MCA territory ischemia appears unchanged since 01/07/2015. Some of the left MCA findings were present in 2013. No associated hemorrhage or mass effect. 2. Chronic  left cerebellar infarct is stable since 2013. 3. Left mastoid and tympanic effusion likely secondary to intubation.   Electronically Signed   By: Genevie Ann M.D.   On: 01/14/2015 14:56    Review of Systems  Unable to perform ROS: intubated   Blood pressure 108/50, pulse 79, temperature 97.7 F (36.5 C), temperature source Oral, resp. rate 31, height 5' 9"  (1.753 m), weight 83 kg (182 lb 15.7 oz), SpO2 100 %. Physical Exam  Constitutional: She appears well-developed and well-nourished. No distress.  HENT:  Head: Normocephalic and atraumatic.  Right Ear: External ear normal.  Left Ear: External ear normal.  Nose: Nose normal.  Mouth/Throat: Oropharynx is clear and moist.  Left middle ear serous effusion.  Oral exam limited by endotracheal intubation.  Eyes: Conjunctivae and EOM are normal. Pupils are equal, round, and reactive to light.  Neck: Normal range of motion. Neck supple.  Cardiovascular: Normal rate.   Respiratory: Effort normal.  Musculoskeletal: Normal range of motion.  Neurological:  Exam limited.  Skin: Skin is warm and dry.  Psychiatric:  Cannot determine.    Assessment/Plan: Left middle ear and mastoid effusion I personally reviewed her recent head CT and previous head CTs.  The effusion seems to have evolved while she has been hospitalized.  On exam, the effusion appears sterile with no sign of inflammation.  I suspect that this is a consequence of prolonged intubation and illness as opposed to a causative factor.  More than likely, effusion will clear as she recovers from her illness but tympanostomy tube placement could be considered if it remains over several weeks.  I do not believe that urgent tympanostomy tube placement would help her mental status.  Hasnain Manheim 01/15/2015, 4:44 PM

## 2015-01-16 ENCOUNTER — Inpatient Hospital Stay (HOSPITAL_COMMUNITY): Payer: Medicare Other

## 2015-01-16 ENCOUNTER — Encounter: Payer: Self-pay | Admitting: *Deleted

## 2015-01-16 DIAGNOSIS — A419 Sepsis, unspecified organism: Secondary | ICD-10-CM | POA: Diagnosis not present

## 2015-01-16 LAB — CBC WITH DIFFERENTIAL/PLATELET
Basophils Absolute: 0 10*3/uL (ref 0.0–0.1)
Basophils Relative: 0 % (ref 0–1)
EOS ABS: 0.2 10*3/uL (ref 0.0–0.7)
Eosinophils Relative: 2 % (ref 0–5)
HCT: 25 % — ABNORMAL LOW (ref 36.0–46.0)
Hemoglobin: 7.8 g/dL — ABNORMAL LOW (ref 12.0–15.0)
LYMPHS ABS: 1.6 10*3/uL (ref 0.7–4.0)
Lymphocytes Relative: 16 % (ref 12–46)
MCH: 27.4 pg (ref 26.0–34.0)
MCHC: 31.2 g/dL (ref 30.0–36.0)
MCV: 87.7 fL (ref 78.0–100.0)
Monocytes Absolute: 0.6 10*3/uL (ref 0.1–1.0)
Monocytes Relative: 6 % (ref 3–12)
NEUTROS PCT: 76 % (ref 43–77)
Neutro Abs: 7.8 10*3/uL — ABNORMAL HIGH (ref 1.7–7.7)
PLATELETS: 180 10*3/uL (ref 150–400)
RBC: 2.85 MIL/uL — ABNORMAL LOW (ref 3.87–5.11)
RDW: 20.8 % — AB (ref 11.5–15.5)
WBC: 10.2 10*3/uL (ref 4.0–10.5)

## 2015-01-16 LAB — BASIC METABOLIC PANEL
ANION GAP: 6 (ref 5–15)
BUN: 47 mg/dL — ABNORMAL HIGH (ref 6–20)
CALCIUM: 11.5 mg/dL — AB (ref 8.9–10.3)
CO2: 21 mmol/L — AB (ref 22–32)
Chloride: 113 mmol/L — ABNORMAL HIGH (ref 101–111)
Creatinine, Ser: 0.93 mg/dL (ref 0.44–1.00)
GFR calc non Af Amer: >60 mL/min (ref >60)
Glucose, Bld: 113 mg/dL — ABNORMAL HIGH (ref 65–99)
POTASSIUM: 4.1 mmol/L (ref 3.5–5.1)
Sodium: 140 mmol/L (ref 135–145)

## 2015-01-16 LAB — PHOSPHORUS: Phosphorus: 3.6 mg/dL (ref 2.5–4.6)

## 2015-01-16 LAB — GLUCOSE, CAPILLARY
GLUCOSE-CAPILLARY: 109 mg/dL — AB (ref 65–99)
Glucose-Capillary: 93 mg/dL (ref 65–99)
Glucose-Capillary: 99 mg/dL (ref 65–99)
Glucose-Capillary: 104 mg/dL — ABNORMAL HIGH (ref 65–99)

## 2015-01-16 LAB — MAGNESIUM: MAGNESIUM: 2 mg/dL (ref 1.7–2.4)

## 2015-01-16 MED ORDER — FREE WATER
100.0000 mL | Freq: Three times a day (TID) | Status: DC
  Administered 2015-01-16 – 2015-01-19 (×9): 100 mL

## 2015-01-16 NOTE — Progress Notes (Signed)
Clinical Social Work continues to follow patient. Patient remains on the vent with palliative medicine team following to establish Goals of Care.  [facility] aware of patient's status and following.  Once GOC have been determined, then CSW will assist with any needs including disposition.    Deretha EmoryHannah Clarinda Obi LCSW, MSW Clinical Social Work: Emergency Room [phone]

## 2015-01-16 NOTE — Progress Notes (Signed)
PULMONARY / CRITICAL CARE MEDICINE   Name: Kellie Velez MRN: [id number] DOB: August 20, 1955    ADMISSION DATE:  01/07/2015 CONSULTATION DATE:  01/16/2015 LOS 9  REFERRING MD :  EDP  CHIEF COMPLAINT:  AMS  INITIAL PRESENTATION:  [name] brought [facility] ED 5/25 with AMS likely due to hypernatremia as well as aspiration PNA.  In ED, intubated for GCS 3 and PCCM called for admission.  STUDIES:  CXR 5/25 >>> hazy right infrahilar atx or infiltrate. CT head 5/25 >>> no acute process.  Remote right MCA infarct.  Chronic microvascular ischemic changes. 5/26 - eeg>>>generalized non-specific cerebral dysfunction(encephalopathy). There was no seizure  Ct head #2>>>old subacute rt mca  SIGNIFICANT EVENTS: 5/25 - admitted with AMS, probable aspiration PNA, (cutlures ultimately grew MSSA) multiple metabolic derangements. 5/26- pupil change, to CT now, fevers, line was placed 01/12/15: Dr Kendrick Fries discussion with sister - > Baseline has been hospitalized since December 2015, started with mental issues, then aspiration pneumonia and recurrent c.diff.  During that time she has had intermittent ability to have conversations which are riddled with delusions and hallucinations.  Sister would like to try to get her off the vent, but she agrees that CPR would be inappropriate (likelihood of meaningful recovery would be very low if she had a cardiac arrest).  Will change code status to limited code blue, continue efforts to get her off the vent this week. Also C Diff positive (reportedly 3rd episode)  01/13/15: Per RN - EEG x 2 is encephalopathy. MRI not possible due to pacer. Last dose of fent 5/25 and versed 5/27-> mostly unrespnsive. Followed 1 command with squeeze yesterday. Still not noticed to move lowers. RUE slightly stronger 2/5 compare to LUE 1/5.  Also rhythmic movement of mouth noted today. RN reports sister DPOA having scheduling issue to make it for family meeting   01/14/15: Per paliative MD - d/w and  review of notes - patient had abrupt decline in sept and since then hospitalzed mostly in psych unit. Current PPS scale is 20. Neuro noted that she is not following any commands ad not moving left side. Repeat CT head ordered . Stil with mouth movement. Not on pressors. Not on sedation. Did SBT 2h yesterday. No effort today   01/15/2015: CT head with subacute R MCA infarct unchanged fro a week earlier - this is c/w left side weakness. In addition chroinic cerebellar and L MCA infarct similar to 2013. She halso has left mastoid effusion. Appears more comatose today and mouth movement has disappeared. Not gotten sedation in days. 7d complete for MSSA    SUBJECTIVE/OVERNIGHT/INTERVAL HX 01/16/15: ENT consult noted - effusions related to intubaiton. Needs serial fu in several weeks. Still obtunded but better per RN.She did follow some commands. Still flaccid on left. Doing SBT for first time  VITAL SIGNS: Temp:  [97.7 F (36.5 C)-98.6 F (37 C)] 98.2 F (36.8 C) (06/03 1138) Pulse Rate:  [60-110] 70 (06/03 1100) Resp:  [10-33] 20 (06/03 1100) BP: (90-136)/(46-92) 115/60 mmHg (06/03 1100) SpO2:  [52 %-100 %] 100 % (06/03 1100) FiO2 (%):  [30 %] 30 % (06/03 0911) Weight:  [83.4 kg (183 lb 13.8 oz)] 83.4 kg (183 lb 13.8 oz) (06/03 0427) HEMODYNAMICS: CVP:  [1 mmHg-3 mmHg] 3 mmHg VENTILATOR SETTINGS: Vent Mode:  [-] CPAP;PSV FiO2 (%):  [30 %] 30 % Set Rate:  [24 bmp] 24 bmp Vt Set:  [530 mL] 530 mL PEEP:  [5 cmH20] 5 cmH20 Pressure Support:  [  10 cmH20] 10 cmH20 Plateau Pressure:  [17 cmH20-20 cmH20] 17 cmH20 INTAKE / OUTPUT: Intake/Output      06/02 0701 - 06/03 0700 06/03 0701 - 06/04 0700   I.V. (mL/kg) 225 (2.7) 40 (0.5)   NG/GT 2550 200   IV Piggyback 305 105   Total Intake(mL/kg) 3080 (36.9) 345 (4.1)   Urine (mL/kg/hr) 1030 (0.5) 30 (0.1)   Emesis/NG output 40 (0)    Stool     Total Output 1070 30   Net +2010 +315          PHYSICAL EXAMINATION:  Gen; Critically and  Chronically ill appearing, on vent HENT: NCAT ETT in place PULM: sync with vent CV: RRR, no mgr GI: BS+, soft, nontender MSK: normal bulk, tone Derm: edema/anasarca all over, fingers dusky and scattered bruises Neuro : No longer moves mouth in rhythmic movement x 48h.  Did following commands per RN. Moved right side. RASS -3 for MD  LABS:  PULMONARY No results for input(s): PHART, PCO2ART, PO2ART, HCO3, TCO2, O2SAT in the last 168 hours.  Invalid input(s): PCO2, PO2  CBC  Recent Labs Lab 01/14/15 0445 01/15/15 0440 01/16/15 0415  HGB 8.1* 7.8* 7.8*  HCT 25.8* 25.1* 25.0*  WBC 7.1 9.4 10.2  PLT 172 175 180    COAGULATION  Recent Labs Lab 01/10/15 0340 01/11/15 0305 01/13/15 1215 01/14/15 0445  INR 1.48 1.39 1.59* 1.41    CARDIAC  No results for input(s): TROPONINI in the last 168 hours. No results for input(s): PROBNP in the last 168 hours.   CHEMISTRY  Recent Labs Lab 01/09/15 1400  01/12/15 0330 01/13/15 1215 01/14/15 0445 01/15/15 0440 01/16/15 0415  NA  --   < > 146* 146* 142 142 140  K  --   < > 4.4 3.6 3.2* 4.1 4.1  CL  --   < > 123* 117* 113* 114* 113*  CO2  --   < > 19* 22 20* 19* 21*  GLUCOSE  --   < > 127* 112* 120* 105* 113*  BUN  --   < > 43* 47* 46* 49* 47*  CREATININE  --   < > 1.24* 1.09* 1.07* 0.98 0.93  CALCIUM  --   < > 11.7* 11.8* 11.5* 11.4* 11.5*  MG  --   --   --   --  1.8 2.3 2.0  PHOS 2.5  --   --   --  2.5 3.1 3.6  < > = values in this interval not displayed. Estimated Creatinine Clearance: 73.3 mL/min (by C-G formula based on Cr of 0.93).   LIVER  Recent Labs Lab 01/10/15 0340 01/11/15 0305 01/13/15 1215 01/14/15 0445  AST 53*  --  17 25  ALT 36  --  5* <5*  ALKPHOS 54  --  62 65  BILITOT 0.2*  --  0.4 0.3  PROT 4.3*  --  4.4* 4.6*  ALBUMIN 1.5*  --  1.5* 1.5*  INR 1.48 1.39 1.59* 1.41     INFECTIOUS No results for input(s): LATICACIDVEN, PROCALCITON in the last 168 hours.   ENDOCRINE CBG (last 3)    Recent Labs  01/15/15 1957 01/16/15 0409 01/16/15 0742  GLUCAP 92 93 99         IMAGING x48h  - iCt Head Wo Contrast  01/14/2015   CLINICAL DATA:  59 year old female with continued encephalopathy. Intubated. Initial encounter.  EXAM: CT HEAD WITHOUT CONTRAST  TECHNIQUE: Contiguous axial images were obtained from the  base of the skull through the vertex without intravenous contrast.  COMPARISON:  01/08/2015 and earlier.  FINDINGS: Partially visible endotracheal and oral enteric tubes. Stable left maxillary sinus mucous retention cyst. Left tympanic and mastoid opacification appears increased. Small volume layering fluid in the pharynx.  No acute osseous abnormality identified. No acute orbit or scalp soft tissue findings.  Calcified atherosclerosis at the skull base. Compared to 01/07/2015, abnormal right MCA territory hypodensity has not changed. This is a new finding compared to 2013. No associated mass effect is evident. No associated hemorrhage. No ventriculomegaly. Patchy hypodensity in the left MCA white matter also appears stable, and was partially present in 2013. Chronic left cerebellar infarct is unchanged since 2013. No suspicious intracranial vascular hyperdensity. No acute intracranial hemorrhage identified.  IMPRESSION: 1. Subacute versus chronic right greater than left MCA territory ischemia appears unchanged since 01/07/2015. Some of the left MCA findings were present in 2013. No associated hemorrhage or mass effect. 2. Chronic left cerebellar infarct is stable since 2013. 3. Left mastoid and tympanic effusion likely secondary to intubation.   Electronically Signed   By: Odessa FlemingH  Hall M.D.   On: 01/14/2015 14:56   Dg Chest Port 1 View  01/16/2015   CLINICAL DATA:  Hypoxia  EXAM: PORTABLE CHEST - 1 VIEW  COMPARISON:  Jan 12, 2015  FINDINGS: Endotracheal tube tip is 4.7 cm above the carina. Central catheter tip is essentially at the cavoatrial junction. Nasogastric tube tip and side port  are below the diaphragm. No pneumothorax. There is persistent airspace consolidation on the left with minimal left effusion. Right lung is clear. Heart size and pulmonary vascularity are normal. No adenopathy. Pacemaker leads are attached to the right atrium and right ventricle.  IMPRESSION: Tube and catheter positions as described without pneumothorax. Persistent left lower lobe consolidation with small left effusion. No new opacity.   Electronically Signed   By: Bretta BangWilliam  Woodruff III M.D.   On: 01/16/2015 07:14        ASSESSMENT / PLAN:  PULMONARY OETT 5/25 >>> A: VDRF due to inability to protect airway, MSSA pneumonia, acute pulm edema   01/15/15: Mental status limiting extubation. Doin SBT  P:   SBT based on criteria No extubation 01/16/2015 CXR in AM VAP bundle See ID  INFECTIOUS BCx2 5/25 > UCx 5/25 > Sputum Cx 5/25 >MSSA 5/29 C diff Positive  A:   Septic shock > resolved MSSA pneumonia 01/07/15 C diff, diagnosed 5/29; family says this is the third time in 6 weeks   - not on pressors. Afebrile. Completed 7d Ancef for MSSA.  On Recurrent C Diff Rx   P:    Vanc, start date 5/25>>> 5/29 Cefepime, start date 5/25>>>5/27 Cefazolin 5/27>>> ................... Flagyl 5/29 x1 Oral vanc higher dose 500mg  q6h 5/29 > 01/15/15, 01/15/15 (lower dose of 125mg  q6h)  >> (14 days) Fidaxomicin 200mg  bid > (14 days)  NEUROLOGIC A:   Acute metabolic encephalopathy > persistent 5/29, likely multifactorial from sepsis, hypernatremia Hx bipolar 1 disorder > family says recent use of Lithium has been harmful to mental status; Prior to 07/2014 was living independently Hx of CVA CT head neg acute x 2  - 01/16/15:CT with  left mastoid effsuiondue to intubation. Some improvement in mental status but still with dense left sided hemiplegia  P:   Repeat CT early July 2016 for mastoid effusion Sedation: NONE RASS goal: 0 Daily WUA. Continue to hold outpatient benztropine, diazepam,  fluphenazine, norco, meclizine, quetiapine, trazodone Maintain keppra Appreciate  neuro input    CARDIOVASCULAR A:  Septic shock > resolved Hx HTN, bradycardia s/p PPM, LVH Poor cap refill ext, currently with normal BP   - not on pressors  P:  Monitor BP   RENAL   A:   CKD IV Hypernatremia resolved.   P   Reduce Free water with tube feeds Monitor BMET and UOP Replace electrolytes as needed   GASTROINTESTINAL A:   GI prophylaxis Nutrition  TF residuals improved  P:   SUP: H2 blockade in setting of C Diff Continue TF DC reglan   HEMATOLOGY  A:   VTE Prophylaxis Coagulopathy > uncertain etiology, resolved  P:  Monitor for bleeding  ENDOCRINE A:   Hyperglycemia - no hx of DM P:   SSI if glucose consistently > 180   Family updated: PCCM MD  Discussed with sister Alesia Banda 5/30. None at bedside 01/15/15 and 01/16/15 Interdisciplinary Family Meeting v Palliative Care Meeting:  Due by: 5/31. -Palliative goals of care done by palliative MD on 01/13/15 and 01/14/15. CCM MD reaching out to palliative for followup update    GOBAL  Await neuro input   The patient is critically ill with multiple organ systems failure and requires high complexity decision making for assessment and support, frequent evaluation and titration of therapies, application of advanced monitoring technologies and extensive interpretation of multiple databases.   Critical Care Time devoted to patient care services described in this note is  30  Minutes. This time reflects time of care of this signee Dr Kalman Shan. This critical care time does not reflect procedure time, or teaching time or supervisory time of PA/NP/Med student/Med Resident etc but could involve care discussion time    Dr. Kalman Shan, M.D., [facility].C.P Pulmonary and Critical Care Medicine Staff Physician [facility] Pager: [phone], If no answer or between  15:00h - 7:00h:  call [phone]  01/16/2015 11:54 AM

## 2015-01-17 ENCOUNTER — Inpatient Hospital Stay (HOSPITAL_COMMUNITY): Payer: Medicare Other

## 2015-01-17 DIAGNOSIS — Z515 Encounter for palliative care: Secondary | ICD-10-CM

## 2015-01-17 LAB — BLOOD GAS, ARTERIAL
ACID-BASE DEFICIT: 2.5 mmol/L — AB (ref 0.0–2.0)
Bicarbonate: 21 mEq/L (ref 20.0–24.0)
Drawn by: 10006
FIO2: 0.3 %
LHR: 24 {breaths}/min
O2 Saturation: 97.8 %
PATIENT TEMPERATURE: 98.6
PCO2 ART: 31.5 mmHg — AB (ref 35.0–45.0)
PEEP/CPAP: 5 cmH2O
PH ART: 7.44 (ref 7.350–7.450)
TCO2: 22 mmol/L (ref 0–100)
VT: 530 mL
pO2, Arterial: 87.9 mmHg (ref 80.0–100.0)

## 2015-01-17 LAB — GLUCOSE, CAPILLARY
GLUCOSE-CAPILLARY: 86 mg/dL (ref 65–99)
GLUCOSE-CAPILLARY: 74 mg/dL (ref 65–99)
GLUCOSE-CAPILLARY: 99 mg/dL (ref 65–99)
Glucose-Capillary: 101 mg/dL — ABNORMAL HIGH (ref 65–99)
Glucose-Capillary: 109 mg/dL — ABNORMAL HIGH (ref 65–99)
Glucose-Capillary: 122 mg/dL — ABNORMAL HIGH (ref 65–99)
Glucose-Capillary: 76 mg/dL (ref 65–99)
Glucose-Capillary: 82 mg/dL (ref 65–99)

## 2015-01-17 LAB — CBC WITH DIFFERENTIAL/PLATELET
BASOS ABS: 0 10*3/uL (ref 0.0–0.1)
Basophils Relative: 0 % (ref 0–1)
EOS ABS: 0.1 10*3/uL (ref 0.0–0.7)
Eosinophils Relative: 1 % (ref 0–5)
HEMATOCRIT: 23.6 % — AB (ref 36.0–46.0)
HEMOGLOBIN: 7.4 g/dL — AB (ref 12.0–15.0)
LYMPHS ABS: 1.2 10*3/uL (ref 0.7–4.0)
Lymphocytes Relative: 14 % (ref 12–46)
MCH: 27.6 pg (ref 26.0–34.0)
MCHC: 31.4 g/dL (ref 30.0–36.0)
MCV: 88.1 fL (ref 78.0–100.0)
MONOS PCT: 5 % (ref 3–12)
Monocytes Absolute: 0.4 10*3/uL (ref 0.1–1.0)
NEUTROS ABS: 7 10*3/uL (ref 1.7–7.7)
NEUTROS PCT: 80 % — AB (ref 43–77)
PLATELETS: 163 10*3/uL (ref 150–400)
RBC: 2.68 MIL/uL — ABNORMAL LOW (ref 3.87–5.11)
RDW: 20.8 % — ABNORMAL HIGH (ref 11.5–15.5)
WBC: 8.8 10*3/uL (ref 4.0–10.5)

## 2015-01-17 LAB — BASIC METABOLIC PANEL
Anion gap: 5 (ref 5–15)
BUN: 45 mg/dL — ABNORMAL HIGH (ref 6–20)
CO2: 23 mmol/L (ref 22–32)
Calcium: 11.8 mg/dL — ABNORMAL HIGH (ref 8.9–10.3)
Chloride: 115 mmol/L — ABNORMAL HIGH (ref 101–111)
Creatinine, Ser: 0.87 mg/dL (ref 0.44–1.00)
GFR calc Af Amer: >60 mL/min (ref >60)
Glucose, Bld: 106 mg/dL — ABNORMAL HIGH (ref 65–99)
Potassium: 4.2 mmol/L (ref 3.5–5.1)
Sodium: 143 mmol/L (ref 135–145)

## 2015-01-17 LAB — MAGNESIUM: Magnesium: 1.9 mg/dL (ref 1.7–2.4)

## 2015-01-17 LAB — PHOSPHORUS: Phosphorus: 3.6 mg/dL (ref 2.5–4.6)

## 2015-01-17 MED ORDER — POTASSIUM CHLORIDE 20 MEQ/15ML (10%) PO SOLN
40.0000 meq | Freq: Two times a day (BID) | ORAL | Status: DC
  Administered 2015-01-17 (×2): 40 meq
  Filled 2015-01-17 (×4): qty 30

## 2015-01-17 MED ORDER — FUROSEMIDE 10 MG/ML IJ SOLN
40.0000 mg | Freq: Three times a day (TID) | INTRAMUSCULAR | Status: DC
  Administered 2015-01-17 – 2015-01-19 (×6): 40 mg via INTRAVENOUS
  Filled 2015-01-17 (×9): qty 4

## 2015-01-17 MED ORDER — MAGNESIUM SULFATE 2 GM/50ML IV SOLN
2.0000 g | Freq: Once | INTRAVENOUS | Status: AC
  Administered 2015-01-17: 2 g via INTRAVENOUS
  Filled 2015-01-17: qty 50

## 2015-01-17 NOTE — Progress Notes (Signed)
Pt had 3 episodes of Ventricular Fibrillation starting at 06:00:35-6:04:35. MD Byrum was notified in regards to these episodes. Will continue to monitor and assess.

## 2015-01-17 NOTE — Progress Notes (Signed)
Daily Progress Note   Patient Name: Kellie SellsMary E Tate       Date: 01/17/2015 DOB: 04/07/1956  Age: 59 y.o. MRN#: [id number] Attending Physician: Kalman ShanMurali Ramaswamy, MD Primary Care Physician: Kaleen MaskELKINS,WILSON OLIVER, MD Admit Date: 01/07/2015  Reason for Consultation/Follow-up: Establishing goals of care, Family-clinician negotiation and Psychosocial/spiritual support  Subjective: Unresponsive on the ventilator. Grimacing. Minimally follows commands. Interval Events: Admitted 5/25  Length of Stay: 10 days  Current Medications: Scheduled Meds:  . antiseptic oral rinse  7 mL Mouth Rinse QID  . chlorhexidine  15 mL Mouth Rinse BID  . famotidine  20 mg Per Tube BID  . feeding supplement (PRO-STAT SUGAR FREE 64)  30 mL Per Tube BID  . fidaxomicin  200 mg Oral BID  . free water  100 mL Per Tube 3 times per day  . furosemide  40 mg Intravenous 3 times per day  . heparin  5,000 Units Subcutaneous 3 times per day  . levETIRAcetam  500 mg Intravenous Q12H  . potassium chloride  40 mEq Per Tube BID  . vancomycin  125 mg Oral 4 times per day    Continuous Infusions: . feeding supplement (VITAL AF 1.2 CAL) 1,000 mL (01/17/15 1200)    PRN Meds: sodium chloride, acetaminophen  Palliative Performance Scale: 10%     Vital Signs: BP 130/62 mmHg  Pulse 78  Temp(Src) 98.2 F (36.8 C) (Oral)  Resp 26  Ht 5\' 9"  (1.753 m)  Wt 83.6 kg (184 lb 4.9 oz)  BMI 27.20 kg/m2  SpO2 100% SpO2: SpO2: 100 % O2 Device: O2 Device: Ventilator O2 Flow Rate:    Intake/output summary:  Intake/Output Summary (Last 24 hours) at 01/17/15 1507 Last data filed at 01/17/15 1343  Gross per 24 hour  Intake   1910 ml  Output   1390 ml  Net    520 ml   LBM:   Baseline Weight: Weight: 103.42 kg (228 lb) Most recent weight: Weight: 83.6 kg (184 lb 4.9 oz)  Physical Exam: Critically ill appearing-on vent- extremities are edematous, weeping   Additional Data Reviewed: Recent Labs     01/16/15  0415   01/17/15  0313  WBC  10.2  8.8  HGB  7.8*  7.4*  PLT  180  163  NA  140  143  BUN  47*  45*  CREATININE  0.93  0.87     Problem List:  Patient Active Problem List   Diagnosis Date Noted  . Palliative care encounter   . Encephalopathy   . Acute pulmonary edema   . Acute respiratory failure with hypoxia   . Hypernatremia   . MSSA (methicillin susceptible Staphylococcus aureus) pneumonia   . Encephalopathy acute   . Pressure ulcer 01/09/2015  . ARF (acute respiratory failure)   . Pneumonia   . Pupil asymmetry   . Altered mental status 01/07/2015  . Respiratory failure   . Blood poisoning   . Bipolar disorder, current episode mixed, severe, with psychotic features   . Psychoses   . 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 Unspecified essential hypertension 09/28/2012  . Bipolar disorder, unspecified 09/28/2012  . TIA (transient ischemic attack) 09/28/2012  . Mild carotid artery disease 09/28/2012  . Osteoporosis, unspecified 09/28/2012  . Bradycardia 09/28/2012  . Heart murmur 09/28/2012  . Hearing loss 09/28/2012  . Arthritis 09/28/2012  . LVH (left ventricular hypertrophy) 09/28/2012     Palliative Care Assessment & Plan   I spoke with Leanne HCPOA by phone today.  We discussed end points and disease trajectories-she also spoke of Almadelia's QOL prior to admission and and the struggles with getting her off lithium. Aspiration has been a chronic problem. Rogene has a history of strokes as well. Most of this is never going to improve especially to the degree that Miyana will be able to care for herself or make a meaningful recovery. Alesia Banda will be at teh hospital tomorrow- I will meet with her and try to move a plan forward given her current condition- big picture is very poor.  Thank you for allowing the Palliative Medicine Team to assist in the care of this patient.  Time: 2PM-3PM Total 60 min   Greater than 50%  of this time was spent counseling and coordinating care related to the above  assessment and plan.   Edsel Petrin, DO  01/17/2015, 3:07 PM  Please contact Palliative Medicine Team phone at [phone] for questions and concerns.

## 2015-01-17 NOTE — Progress Notes (Signed)
PULMONARY / CRITICAL CARE MEDICINE   Name: Kellie Velez MRN: [id number] DOB: 01/24/1956    ADMISSION DATE:  01/07/2015 CONSULTATION DATE:  01/17/2015 LOS 10  REFERRING MD :  EDP  CHIEF COMPLAINT:  AMS  INITIAL PRESENTATION:  [name] [facility] with AMS likely due to hypernatremia as well as aspiration PNA.  In ED, intubated for GCS 3 and PCCM called for admission.  STUDIES:  CXR 5/25 >>> hazy right infrahilar atx or infiltrate. CT head 5/25 >>> no acute process.  Remote right MCA infarct.  Chronic microvascular ischemic changes. 5/26 - eeg>>>generalized non-specific cerebral dysfunction(encephalopathy). There was no seizure  Ct head #2>>>old subacute rt mca  SIGNIFICANT EVENTS: 5/25 - admitted with AMS, probable aspiration PNA, (cutlures ultimately grew MSSA) multiple metabolic derangements. 5/26- pupil change, to CT now, fevers, line was placed 01/12/15: Dr Kendrick FriesMcQuaid discussion with sister - > Baseline has been hospitalized since December 2015, started with mental issues, then aspiration pneumonia and recurrent c.diff.  During that time she has had intermittent ability to have conversations which are riddled with delusions and hallucinations.  Sister would like to try to get her off the vent, but she agrees that CPR would be inappropriate (likelihood of meaningful recovery would be very low if she had a cardiac arrest).  Will change code status to limited code blue, continue efforts to get her off the vent this week. Also C Diff positive (reportedly 3rd episode)  01/13/15: Per RN - EEG x 2 is encephalopathy. MRI not possible due to pacer. Last dose of fent 5/25 and versed 5/27-> mostly unrespnsive. Followed 1 command with squeeze yesterday. Still not noticed to move lowers. RUE slightly stronger 2/5 compare to LUE 1/5.  Also rhythmic movement of mouth noted today. RN reports sister DPOA having scheduling issue to make it for family meeting   01/14/15: Per paliative MD - d/w and  review of notes - patient had abrupt decline in sept and since then hospitalzed mostly in psych unit. Current PPS scale is 20. Neuro noted that she is not following any commands ad not moving left side. Repeat CT head ordered . Stil with mouth movement. Not on pressors. Not on sedation. Did SBT 2h yesterday. No effort today   01/15/2015: CT head with subacute R MCA infarct unchanged fro a week earlier - this is c/w left side weakness. In addition chroinic cerebellar and L MCA infarct similar to 2013. She halso has left mastoid effusion. Appears more comatose today and mouth movement has disappeared. Not gotten sedation in days. 7d complete for MSSA  01/16/15: ENT consult noted - effusions related to intubaiton. Needs serial fu in several weeks. Still obtunded but better per RN.She did follow some commands. Still flaccid on left. Doing SBT for first time   SUBJECTIVE/OVERNIGHT/INTERVAL HX 01/17/15: More awake per RN. Doing SBT. Per RN: palliative care was updaed by RN: they are awaiting family.  Not on pressors  VITAL SIGNS: Temp:  [97.6 F (36.4 C)-98.2 F (36.8 C)] 98.2 F (36.8 C) (06/04 1156) Pulse Rate:  [69-91] 70 (06/04 0800) Resp:  [11-26] 20 (06/04 1057) BP: (80-142)/(45-73) 142/55 mmHg (06/04 1057) SpO2:  [96 %-100 %] 100 % (06/04 1057) FiO2 (%):  [30 %] 30 % (06/04 1057) Weight:  [83.6 kg (184 lb 4.9 oz)] 83.6 kg (184 lb 4.9 oz) (06/04 0312) HEMODYNAMICS:   VENTILATOR SETTINGS: Vent Mode:  [-] PSV;CPAP FiO2 (%):  [30 %] 30 % Set Rate:  [24 bmp]  24 bmp Vt Set:  [530 mL] 530 mL PEEP:  [5 cmH20] 5 cmH20 Pressure Support:  [5 cmH20-15 cmH20] 5 cmH20 Plateau Pressure:  [17 cmH20-22 cmH20] 22 cmH20 INTAKE / OUTPUT: Intake/Output      06/03 0701 - 06/04 0700 06/04 0701 - 06/05 0700   I.V. (mL/kg) 220 (2.6) 20 (0.2)   NG/GT 1400 100   IV Piggyback 210 105   Total Intake(mL/kg) 1830 (21.9) 225 (2.7)   Urine (mL/kg/hr) 1270 (0.6) 150 (0.3)   Emesis/NG output     Stool 100 (0) 0 (0)    Total Output 1370 150   Net +460 +75        Urine Occurrence 4 x 1 x   Stool Occurrence  1 x     PHYSICAL EXAMINATION:  Gen; Critically and Chronically ill appearing, on vent HENT: NCAT ETT in place PULM: sync with vent CV: RRR, no mgr GI: BS+, soft, nontender MSK: normal bulk, tone Derm: edema/anasarca all over, fingers dusky and scattered bruises Neuro : No longer moves mouth in rhythmic movement x 72hh.  RASS -2 for MD (improved from -4)   LABS:  PULMONARY  Recent Labs Lab 01/17/15 0400  PHART 7.440  PCO2ART 31.5*  PO2ART 87.9  HCO3 21.0  TCO2 22.0  O2SAT 97.8    CBC  Recent Labs Lab 01/15/15 0440 01/16/15 0415 01/17/15 0313  HGB 7.8* 7.8* 7.4*  HCT 25.1* 25.0* 23.6*  WBC 9.4 10.2 8.8  PLT 175 180 163    COAGULATION  Recent Labs Lab 01/11/15 0305 01/13/15 1215 01/14/15 0445  INR 1.39 1.59* 1.41    CARDIAC  No results for input(s): TROPONINI in the last 168 hours. No results for input(s): PROBNP in the last 168 hours.   CHEMISTRY  Recent Labs Lab 01/13/15 1215 01/14/15 0445 01/15/15 0440 01/16/15 0415 01/17/15 0313  NA 146* 142 142 140 143  K 3.6 3.2* 4.1 4.1 4.2  CL 117* 113* 114* 113* 115*  CO2 22 20* 19* 21* 23  GLUCOSE 112* 120* 105* 113* 106*  BUN 47* 46* 49* 47* 45*  CREATININE 1.09* 1.07* 0.98 0.93 0.87  CALCIUM 11.8* 11.5* 11.4* 11.5* 11.8*  MG  --  1.8 2.3 2.0 1.9  PHOS  --  2.5 3.1 3.6 3.6   Estimated Creatinine Clearance: 78.5 mL/min (by C-G formula based on Cr of 0.87).   LIVER  Recent Labs Lab 01/11/15 0305 01/13/15 1215 01/14/15 0445  AST  --  17 25  ALT  --  5* <5*  ALKPHOS  --  62 65  BILITOT  --  0.4 0.3  PROT  --  4.4* 4.6*  ALBUMIN  --  1.5* 1.5*  INR 1.39 1.59* 1.41     INFECTIOUS No results for input(s): LATICACIDVEN, PROCALCITON in the last 168 hours.   ENDOCRINE CBG (last 3)   Recent Labs  01/17/15 0312 01/17/15 0818 01/17/15 1153  GLUCAP 74 99 122*         IMAGING  x48h  - iDg Chest Port 1 View  01/17/2015   CLINICAL DATA:  Endotracheal tube present.  EXAM: PORTABLE CHEST - 1 VIEW  COMPARISON:  One-view chest 01/16/2015  FINDINGS: The heart size is normal. A left pleural effusion and basilar airspace disease are again noted. The endotracheal tube terminates 3 cm above the carina. Pacing wires are stable. The NG tube courses off the inferior border the film. A right IJ line terminates at cavoatrial junction. Mild edema is present.  IMPRESSION: 1. No significant change in mild edema, left pleural effusion and basilar airspace disease likely atelectasis. 2. Stable and satisfactory positioning of the support apparatus.   Electronically Signed   By: Marin Roberts M.D.   On: 01/17/2015 07:33   Dg Chest Port 1 View  01/16/2015   CLINICAL DATA:  Hypoxia  EXAM: PORTABLE CHEST - 1 VIEW  COMPARISON:  Jan 12, 2015  FINDINGS: Endotracheal tube tip is 4.7 cm above the carina. Central catheter tip is essentially at the cavoatrial junction. Nasogastric tube tip and side port are below the diaphragm. No pneumothorax. There is persistent airspace consolidation on the left with minimal left effusion. Right lung is clear. Heart size and pulmonary vascularity are normal. No adenopathy. Pacemaker leads are attached to the right atrium and right ventricle.  IMPRESSION: Tube and catheter positions as described without pneumothorax. Persistent left lower lobe consolidation with small left effusion. No new opacity.   Electronically Signed   By: Bretta Bang III M.D.   On: 01/16/2015 07:14        ASSESSMENT / PLAN:  PULMONARY OETT 5/25 >>> A: VDRF due to inability to protect airway, MSSA pneumonia, acute pulm edema   01/17/15: Mental status limiting extubation but improving mental status. Doin SBT  P:   SBT based on criteria No extubation 01/17/2015 CXR in AM VAP bundle See ID  INFECTIOUS BCx2 5/25 > UCx 5/25 > Sputum Cx 5/25 >MSSA 5/29 C diff Positive  A:    Septic shock > resolved MSSA pneumonia 01/07/15 C diff, diagnosed 5/29; family says this is the third time in 6 weeks   - not on pressors. Afebrile. Completed 7d Ancef for MSSA.  On Recurrent C Diff Rx.  P:    Vanc, start date 5/25>>> 5/29 Cefepime, start date 5/25>>>5/27 Cefazolin 5/27>>> ................... Flagyl 5/29 x1 Oral vanc higher dose 500mg  q6h 5/29 > 01/15/15, 01/15/15 (lower dose of 125mg  q6h)  >> (14 days) Fidaxomicin 200mg  bid > (14 days)  NEUROLOGIC A:   Acute metabolic encephalopathy > persistent 5/29, likely multifactorial from sepsis, hypernatremia Hx bipolar 1 disorder > family says recent use of Lithium has been harmful to mental status; Prior to 07/2014 was living independently Hx of CVA CT head neg acute x 2  - 01/16/15:CT with  left mastoid effsuiondue to intubation. Some improvement in mental status but still with dense left sided hemiplegia  P:   Repeat CT early July 2016 for mastoid effusion Sedation: NONE  RASS goal: 0 Daily WUA. Continue to hold outpatient benztropine, diazepam, fluphenazine, norco, meclizine, quetiapine, trazodone Maintain keppra Appreciate neuro input    CARDIOVASCULAR A:  Septic shock > resolved Hx HTN, bradycardia s/p PPM, LVH Poor cap refill ext, currently with normal BP   - not on pressors.  Appears volume overloaded on exam   P:  Monitor BP Diurese  RENAL   A:   CKD IV Hypernatremia resolved.   P    Free water with tube feeds for maintenance Monitor BMET and UOP Replace electrolytes as needed   GASTROINTESTINAL A:   GI prophylaxis Nutrition  TF residuals improved  P:   SUP: H2 blockade in setting of C Diff Continue TF DC reglan   HEMATOLOGY  A:   VTE Prophylaxis Coagulopathy > uncertain etiology, resolved  P:  Monitor for bleeding  ENDOCRINE A:   Hyperglycemia - no hx of DM P:   SSI if glucose consistently > 180   Family updated: PCCM MD  Discussed with  sister Alesia Banda 5/30. None at  bedside 01/15/15 and 01/16/15, 01/17/15. Last pallaitive round 01/14/15 Interdisciplinary Family Meeting v Palliative Care Meeting:  Due by: 5/31. -Palliative goals of care done by palliative MD on 01/13/15 and 01/14/15. CCM MD reaching out to palliative for followup update    GOBAL Slowly improving   The patient is critically ill with multiple organ systems failure and requires high complexity decision making for assessment and support, frequent evaluation and titration of therapies, application of advanced monitoring technologies and extensive interpretation of multiple databases.   Critical Care Time devoted to patient care services described in this note is  30  Minutes. This time reflects time of care of this signee Dr Kalman Shan. This critical care time does not reflect procedure time, or teaching time or supervisory time of PA/NP/Med student/Med Resident etc but could involve care discussion time    Dr. Kalman Shan, M.D., [facility].C.P Pulmonary and Critical Care Medicine Staff Physician [facility] [place] [facility]monary [facility] Critical Care Pager: [phone], If no answer or between  15:00h - 7:00h: call [phone]  01/17/2015 12:44 PM

## 2015-01-18 ENCOUNTER — Inpatient Hospital Stay (HOSPITAL_COMMUNITY): Payer: Medicare Other

## 2015-01-18 DIAGNOSIS — Z789 Other specified health status: Secondary | ICD-10-CM

## 2015-01-18 LAB — CBC WITH DIFFERENTIAL/PLATELET
Basophils Absolute: 0 10*3/uL (ref 0.0–0.1)
Basophils Relative: 0 % (ref 0–1)
Eosinophils Absolute: 0.1 10*3/uL (ref 0.0–0.7)
Eosinophils Relative: 1 % (ref 0–5)
HCT: 26.5 % — ABNORMAL LOW (ref 36.0–46.0)
HEMOGLOBIN: 8.2 g/dL — AB (ref 12.0–15.0)
LYMPHS ABS: 1.7 10*3/uL (ref 0.7–4.0)
Lymphocytes Relative: 14 % (ref 12–46)
MCH: 27.6 pg (ref 26.0–34.0)
MCHC: 30.9 g/dL (ref 30.0–36.0)
MCV: 89.2 fL (ref 78.0–100.0)
Monocytes Absolute: 0.5 10*3/uL (ref 0.1–1.0)
Monocytes Relative: 4 % (ref 3–12)
NEUTROS ABS: 9.8 10*3/uL — AB (ref 1.7–7.7)
Neutrophils Relative %: 81 % — ABNORMAL HIGH (ref 43–77)
Platelets: 168 10*3/uL (ref 150–400)
RBC: 2.97 MIL/uL — ABNORMAL LOW (ref 3.87–5.11)
RDW: 20.8 % — ABNORMAL HIGH (ref 11.5–15.5)
WBC: 12.2 10*3/uL — ABNORMAL HIGH (ref 4.0–10.5)

## 2015-01-18 LAB — GLUCOSE, CAPILLARY
GLUCOSE-CAPILLARY: 100 mg/dL — AB (ref 65–99)
GLUCOSE-CAPILLARY: 109 mg/dL — AB (ref 65–99)
GLUCOSE-CAPILLARY: 75 mg/dL (ref 65–99)
GLUCOSE-CAPILLARY: 90 mg/dL (ref 65–99)
Glucose-Capillary: 125 mg/dL — ABNORMAL HIGH (ref 65–99)
Glucose-Capillary: 103 mg/dL — ABNORMAL HIGH (ref 65–99)

## 2015-01-18 LAB — BASIC METABOLIC PANEL
Anion gap: 6 (ref 5–15)
Anion gap: 6 (ref 5–15)
BUN: 44 mg/dL — AB (ref 6–20)
BUN: 46 mg/dL — AB (ref 6–20)
CALCIUM: 12.3 mg/dL — AB (ref 8.9–10.3)
CHLORIDE: 114 mmol/L — AB (ref 101–111)
CHLORIDE: 112 mmol/L — AB (ref 101–111)
CO2: 25 mmol/L (ref 22–32)
CO2: 27 mmol/L (ref 22–32)
Calcium: 12.7 mg/dL — ABNORMAL HIGH (ref 8.9–10.3)
Creatinine, Ser: 0.9 mg/dL (ref 0.44–1.00)
Creatinine, Ser: 0.99 mg/dL (ref 0.44–1.00)
GFR calc Af Amer: >60 mL/min (ref >60)
GFR calc Af Amer: >60 mL/min (ref >60)
GFR calc non Af Amer: >60 mL/min (ref >60)
GFR calc non Af Amer: >60 mL/min (ref >60)
Glucose, Bld: 113 mg/dL — ABNORMAL HIGH (ref 65–99)
Glucose, Bld: 130 mg/dL — ABNORMAL HIGH (ref 65–99)
POTASSIUM: 5.7 mmol/L — AB (ref 3.5–5.1)
Potassium: 4.9 mmol/L (ref 3.5–5.1)
Sodium: 145 mmol/L (ref 135–145)
Sodium: 145 mmol/L (ref 135–145)

## 2015-01-18 LAB — PHOSPHORUS: Phosphorus: 3.7 mg/dL (ref 2.5–4.6)

## 2015-01-18 LAB — MAGNESIUM: MAGNESIUM: 2.4 mg/dL (ref 1.7–2.4)

## 2015-01-18 LAB — BRAIN NATRIURETIC PEPTIDE: B NATRIURETIC PEPTIDE 5: 54.2 pg/mL (ref 0.0–100.0)

## 2015-01-18 NOTE — Progress Notes (Signed)
eLink Physician-Brief Progress Note Patient Name: Kellie SellsMary E Velez DOB: 05/25/1956 MRN: [id number]   Date of Service  01/18/2015  HPI/Events of Note  K 5.7, note scheduled KCl + lasix  eICU Interventions  - will d/c the scheduled KCl - recheck BMP at 10:00     Intervention Category Intermediate Interventions: Electrolyte abnormality - evaluation and management  Meadow Abramo S. 01/18/2015, 6:11 AM

## 2015-01-18 NOTE — Progress Notes (Signed)
Palliative Medicine team paged that POA arrived. POA previously had not committed to a time-PMT responded within 30 minutes to the call but family member left the unit and could not be contacted or located. I am recommending one-way extubation, DNR and to solidify goals of care in terms of future disease trajectory- this case is complicated by severe, chronic, mental illness in a patient with multiple health problems. Will speak again with POA by phone-would not proceed with tracheostomy or escalate interventions without a face-to-face goals of care meeting with patient and HCPOA. Our team will be available to support next transition.  Anderson MaltaElizabeth Bryleigh Ottaway, DO Palliative Medicine [phone]

## 2015-01-18 NOTE — Progress Notes (Signed)
PULMONARY / CRITICAL CARE MEDICINE   Name: Kellie Velez MRN: [id number] DOB: 07/05/1956    ADMISSION DATE:  01/07/2015 CONSULTATION DATE:  01/18/2015 LOS 11  REFERRING MD :  EDP  CHIEF COMPLAINT:  AMS  INITIAL PRESENTATION:  [name] [facility] with AMS likely due to hypernatremia as well as aspiration PNA.  In ED, intubated for GCS 3 and PCCM called for admission.  STUDIES:  CXR 5/25 >>> hazy right infrahilar atx or infiltrate. CT head 5/25 >>> no acute process.  Remote right MCA infarct.  Chronic microvascular ischemic changes. 5/26 - eeg>>>generalized non-specific cerebral dysfunction(encephalopathy). There was no seizure  Ct head #2>>>old subacute rt mca  SIGNIFICANT EVENTS: 5/25 - admitted with AMS, probable aspiration PNA, (cutlures ultimately grew MSSA) multiple metabolic derangements. 5/26- pupil change, to CT now, fevers, line was placed 01/12/15: Dr Kendrick FriesMcQuaid discussion with sister - > Baseline has been hospitalized since December 2015, started with mental issues, then aspiration pneumonia and recurrent c.diff.  During that time she has had intermittent ability to have conversations which are riddled with delusions and hallucinations.  Sister would like to try to get her off the vent, but she agrees that CPR would be inappropriate (likelihood of meaningful recovery would be very low if she had a cardiac arrest).  Will change code status to limited code blue, continue efforts to get her off the vent this week. Also C Diff positive (reportedly 3rd episode)  01/13/15: Per RN - EEG x 2 is encephalopathy. MRI not possible due to pacer. Last dose of fent 5/25 and versed 5/27-> mostly unrespnsive. Followed 1 command with squeeze yesterday. Still not noticed to move lowers. RUE slightly stronger 2/5 compare to LUE 1/5.  Also rhythmic movement of mouth noted today. RN reports sister DPOA having scheduling issue to make it for family meeting   01/14/15: Per paliative MD - d/w and  review of notes - patient had abrupt decline in sept and since then hospitalzed mostly in psych unit. Current PPS scale is 20. Neuro noted that she is not following any commands ad not moving left side. Repeat CT head ordered . Stil with mouth movement. Not on pressors. Not on sedation. Did SBT 2h yesterday. No effort today   01/15/2015: CT head with subacute R MCA infarct unchanged fro a week earlier - this is c/w left side weakness. In addition chroinic cerebellar and L MCA infarct similar to 2013. She halso has left mastoid effusion. Appears more comatose today and mouth movement has disappeared. Not gotten sedation in days. 7d complete for MSSA  01/16/15: ENT consult noted - effusions related to intubaiton. Needs serial fu in several weeks. Still obtunded but better per RN.She did follow some commands. Still flaccid on left. Doing SBT for first time   01/17/15: More awake per RN. Doing SBT. Per RN: palliative care was updaed by RN: they are awaiting family.  Not on pressors   SUBJECTIVE/OVERNIGHT/INTERVAL HX 01/18/15 Doing SBT. Follows some commands . But overall still lethargic. Palliative care notes - aspiration and disability and psych issues will be a long term problem. Family meeeting possibly later today. Diuersising well  VITAL SIGNS: Temp:  [97.5 F (36.4 C)-98.2 F (36.8 C)] 98 F (36.7 C) (06/05 0827) Pulse Rate:  [57-131] 72 (06/05 1120) Resp:  [15-30] 30 (06/05 1120) BP: (96-130)/(41-69) 111/61 mmHg (06/05 1120) SpO2:  [94 %-100 %] 100 % (06/05 1120) FiO2 (%):  [30 %] 30 % (06/05 1124) Weight:  [  79.2 kg (174 lb 9.7 oz)] 79.2 kg (174 lb 9.7 oz) (06/05 0200) HEMODYNAMICS:   VENTILATOR SETTINGS: Vent Mode:  [-] CPAP;PSV FiO2 (%):  [30 %] 30 % Set Rate:  [24 bmp] 24 bmp Vt Set:  [530 mL] 530 mL PEEP:  [5 cmH20] 5 cmH20 Pressure Support:  [5 cmH20] 5 cmH20 Plateau Pressure:  [16 cmH20-26 cmH20] 16 cmH20 INTAKE / OUTPUT: Intake/Output      06/04 0701 - 06/05 0700 06/05 0701 -  06/06 0700   I.V. (mL/kg) 230 (2.9) 50 (0.6)   NG/GT 1560 250   IV Piggyback 260 105   Total Intake(mL/kg) 2050 (25.9) 405 (5.1)   Urine (mL/kg/hr) 1900 (1) 1100 (2.9)   Stool 0 (0)    Total Output 1900 1100   Net +150 -695        Urine Occurrence 3 x    Stool Occurrence 2 x      PHYSICAL EXAMINATION:  Gen; Critically and Chronically ill appearing, on vent HENT: NCAT ETT in place PULM: sync with vent CV: RRR, no mgr GI: BS+, soft, nontender MSK: normal bulk, tone Derm: edema/anasarca all over, fingers dusky and scattered bruises - improved 01/18/15 following lasix Neuro :  RASS -1 for MD (improved from -4). OVerall lethargic. BEginning to move left side   LABS:  PULMONARY  Recent Labs Lab 01/17/15 0400  PHART 7.440  PCO2ART 31.5*  PO2ART 87.9  HCO3 21.0  TCO2 22.0  O2SAT 97.8    CBC  Recent Labs Lab 01/16/15 0415 01/17/15 0313 01/18/15 0402  HGB 7.8* 7.4* 8.2*  HCT 25.0* 23.6* 26.5*  WBC 10.2 8.8 12.2*  PLT 180 163 168    COAGULATION  Recent Labs Lab 01/13/15 1215 01/14/15 0445  INR 1.59* 1.41    CARDIAC  No results for input(s): TROPONINI in the last 168 hours. No results for input(s): PROBNP in the last 168 hours.   CHEMISTRY  Recent Labs Lab 01/14/15 0445 01/15/15 0440 01/16/15 0415 01/17/15 0313 01/18/15 0402  NA 142 142 140 143 145  K 3.2* 4.1 4.1 4.2 5.7*  CL 113* 114* 113* 115* 114*  CO2 20* 19* 21* 23 25  GLUCOSE 120* 105* 113* 106* 113*  BUN 46* 49* 47* 45* 44*  CREATININE 1.07* 0.98 0.93 0.87 0.90  CALCIUM 11.5* 11.4* 11.5* 11.8* 12.3*  MG 1.8 2.3 2.0 1.9 2.4  PHOS 2.5 3.1 3.6 3.6 3.7   Estimated Creatinine Clearance: 68.6 mL/min (by C-G formula based on Cr of 0.9).   LIVER  Recent Labs Lab 01/13/15 1215 01/14/15 0445  AST 17 25  ALT 5* <5*  ALKPHOS 62 65  BILITOT 0.4 0.3  PROT 4.4* 4.6*  ALBUMIN 1.5* 1.5*  INR 1.59* 1.41     INFECTIOUS No results for input(s): LATICACIDVEN, PROCALCITON in the last 168  hours.   ENDOCRINE CBG (last 3)   Recent Labs  01/17/15 2347 01/18/15 0352 01/18/15 0828  GLUCAP 90 75 100*         IMAGING x48h  - iDg Chest Port 1 View  01/18/2015   CLINICAL DATA:  Acute respiratory failure. On ventilator.  Hypoxia  EXAM: PORTABLE CHEST - 1 VIEW  COMPARISON:  01/17/2015  FINDINGS: Patient is rotated to the left on today's exam. Support lines and tubes in appropriate position. Bilateral lower lobe airspace disease is again seen, left side greater than right, without significant change. No definite pneumothorax visualized.  IMPRESSION: Left greater than right lower lobe airspace disease, without significant  change.   Electronically Signed   By: Myles Rosenthal M.D.   On: 01/18/2015 09:09   Dg Chest Port 1 View  01/17/2015   CLINICAL DATA:  Endotracheal tube present.  EXAM: PORTABLE CHEST - 1 VIEW  COMPARISON:  One-view chest 01/16/2015  FINDINGS: The heart size is normal. A left pleural effusion and basilar airspace disease are again noted. The endotracheal tube terminates 3 cm above the carina. Pacing wires are stable. The NG tube courses off the inferior border the film. A right IJ line terminates at cavoatrial junction. Mild edema is present.  IMPRESSION: 1. No significant change in mild edema, left pleural effusion and basilar airspace disease likely atelectasis. 2. Stable and satisfactory positioning of the support apparatus.   Electronically Signed   By: Marin Roberts M.D.   On: 01/17/2015 07:33        ASSESSMENT / PLAN:  PULMONARY OETT 5/25 >>> A: VDRF due to inability to protect airway, MSSA pneumonia, acute pulm edema   01/18/15: Mental status limiting extubation but improving mental status. Doin SBT  P:   SBT based on criteria No extubation 01/18/2015 (need goals of care complete as well) CXR in AM VAP bundle See ID  INFECTIOUS BCx2 5/25 > UCx 5/25 > Sputum Cx 5/25 >MSSA 5/29 C diff Positive  A:   Septic shock > resolved MSSA pneumonia  01/07/15 C diff, diagnosed 5/29; family says this is the third time in 6 weeks   - not on pressors. Afebrile. Completed 7d Ancef for MSSA.  On Recurrent C Diff Rx. No change 01/18/15 P:    Vanc, start date 5/25>>> 5/29 Cefepime, start date 5/25>>>5/27 Cefazolin 5/27>>> ................... Flagyl 5/29 x1 Oral vanc higher dose  q6h 5/29 > 01/15/15, 01/15/15 (lower dose of  q6h)  >> (14 days) Fidaxomicin  bid > (14 days)  NEUROLOGIC A:   Acute metabolic encephalopathy > persistent 5/29, likely multifactorial from sepsis, hypernatremia Hx bipolar 1 disorder > family says recent use of Lithium has been harmful to mental status; Prior to 07/2014 was living independently Hx of CVA CT head neg acute x 2  - 01/16/15:CT with  left mastoid effsuiondue to intubation.   01/18/15: Some improvement in mental status and with  dense left sided hemiplegia  P:   Repeat CT early July 2016 for mastoid effusion Sedation: NONE  RASS goal: 0 Daily WUA. Continue to hold outpatient benztropine, diazepam, fluphenazine, norco, meclizine, quetiapine, trazodone Maintain keppra Appreciate neuro input    CARDIOVASCULAR A:  Septic shock > resolved Hx HTN, bradycardia s/p PPM, LVH Poor cap refill ext, currently with normal BP   - not on pressors.  Appears volume overloaded on exam but improved after lasix since 01/17/15   P:  Monitor BP Diurese  RENAL   A:   CKD IV Hypernatremia resolved.    - hyperkalemia mild due to kcl supplementation  P   DC kcl Continue diuresis  Free water with tube feeds for maintenance Monitor BMET and UOP Replace electrolytes as needed   GASTROINTESTINAL A:   GI prophylaxis Nutrition  Tolerating TF  P:   SUP: H2 blockade in setting of C Diff Continue TF Avoid reglan due to pscyh issues (was on it in ICU)   HEMATOLOGY  A:   VTE Prophylaxis Coagulopathy > uncertain etiology, resolved  P:  Monitor for bleeding  ENDOCRINE A:   Hyperglycemia  - no hx of DM P:   SSI if glucose consistently > 180   Family  updated: PCCM MD  Discussed with sister Kellie Velez 5/30. None at bedside 01/15/15 and 01/16/15, 01/17/15. Last pallaitive round 01/14/15  Interdisciplinary Family Meeting v Palliative Care Meeting:  Due by: 5/31. -Palliative goals of care done by palliative MD on 01/13/15 and 01/14/15 Famiy meet pending 01/18/2015 with pall care .      GOBAL Slowly improving but big picture very poor. Not a good candidate for LTAC, [facility]   The patient is critically ill with multiple organ systems failure and requires high complexity decision making for assessment and support, frequent evaluation and titration of therapies, application of advanced monitoring technologies and extensive interpretation of multiple databases.   Critical Care Time devoted to patient care services described in this note is  30  Minutes. This time reflects time of care of this signee Dr Kalman Shan. This critical care time does not reflect procedure time, or teaching time or supervisory time of PA/NP/Med student/Med Resident etc but could involve care discussion time    Dr. Kalman Shan, M.D., [facility].C.P Pulmonary and Critical Care Medicine Staff Physician [facility] [place] [facility] Pager: [phone], If no answer or between  15:00h - 7:00h: call [phone]  01/18/2015 11:47 AM

## 2015-01-19 ENCOUNTER — Inpatient Hospital Stay (HOSPITAL_COMMUNITY): Payer: Medicare Other

## 2015-01-19 LAB — CBC WITH DIFFERENTIAL/PLATELET
BASOS ABS: 0.1 10*3/uL (ref 0.0–0.1)
Basophils Relative: 1 % (ref 0–1)
Eosinophils Absolute: 0.2 10*3/uL (ref 0.0–0.7)
Eosinophils Relative: 2 % (ref 0–5)
HEMATOCRIT: 27.3 % — AB (ref 36.0–46.0)
Hemoglobin: 8.3 g/dL — ABNORMAL LOW (ref 12.0–15.0)
Lymphocytes Relative: 20 % (ref 12–46)
Lymphs Abs: 1.9 10*3/uL (ref 0.7–4.0)
MCH: 27.1 pg (ref 26.0–34.0)
MCHC: 30.4 g/dL (ref 30.0–36.0)
MCV: 89.2 fL (ref 78.0–100.0)
MONOS PCT: 6 % (ref 3–12)
Monocytes Absolute: 0.6 10*3/uL (ref 0.1–1.0)
NEUTROS ABS: 7.2 10*3/uL (ref 1.7–7.7)
NEUTROS PCT: 73 % (ref 43–77)
PLATELETS: 182 10*3/uL (ref 150–400)
RBC: 3.06 MIL/uL — AB (ref 3.87–5.11)
RDW: 20.7 % — ABNORMAL HIGH (ref 11.5–15.5)
WBC: 9.9 10*3/uL (ref 4.0–10.5)

## 2015-01-19 LAB — BASIC METABOLIC PANEL
Anion gap: 11 (ref 5–15)
BUN: 56 mg/dL — ABNORMAL HIGH (ref 6–20)
CO2: 27 mmol/L (ref 22–32)
Calcium: 12.7 mg/dL — ABNORMAL HIGH (ref 8.9–10.3)
Chloride: 109 mmol/L (ref 101–111)
Creatinine, Ser: 1.17 mg/dL — ABNORMAL HIGH (ref 0.44–1.00)
GFR calc non Af Amer: 49 mL/min — ABNORMAL LOW (ref >60)
GFR, EST AFRICAN AMERICAN: 57 mL/min — AB (ref >60)
GLUCOSE: 111 mg/dL — AB (ref 65–99)
POTASSIUM: 4.5 mmol/L (ref 3.5–5.1)
Sodium: 147 mmol/L — ABNORMAL HIGH (ref 135–145)

## 2015-01-19 LAB — PHOSPHORUS: PHOSPHORUS: 4.1 mg/dL (ref 2.5–4.6)

## 2015-01-19 LAB — GLUCOSE, CAPILLARY
GLUCOSE-CAPILLARY: 105 mg/dL — AB (ref 65–99)
Glucose-Capillary: 111 mg/dL — ABNORMAL HIGH (ref 65–99)
Glucose-Capillary: 111 mg/dL — ABNORMAL HIGH (ref 65–99)
Glucose-Capillary: 118 mg/dL — ABNORMAL HIGH (ref 65–99)
Glucose-Capillary: 125 mg/dL — ABNORMAL HIGH (ref 65–99)
Glucose-Capillary: 127 mg/dL — ABNORMAL HIGH (ref 65–99)

## 2015-01-19 LAB — MAGNESIUM: Magnesium: 2.2 mg/dL (ref 1.7–2.4)

## 2015-01-19 MED ORDER — FREE WATER
200.0000 mL | Freq: Four times a day (QID) | Status: DC
  Administered 2015-01-19 – 2015-01-22 (×11): 200 mL

## 2015-01-19 MED ORDER — LEVETIRACETAM 100 MG/ML PO SOLN
500.0000 mg | Freq: Two times a day (BID) | ORAL | Status: DC
  Administered 2015-01-19 – 2015-01-21 (×5): 500 mg
  Filled 2015-01-19 (×8): qty 5

## 2015-01-19 NOTE — Clinical Social Work Note (Signed)
[facility] Per RNCM, family meeting planned for Tuesday, 6/7.   FL-2 updated. Clinical Social Worker will continue to follow patient and pt's family for continued support.   Derenda FennelBashira Jessicaann Overbaugh, MSW, LCSWA [phone] 01/19/2015 1:02 PM

## 2015-01-19 NOTE — Progress Notes (Signed)
PULMONARY / CRITICAL CARE MEDICINE   Name: Kellie Velez MRN: [id number] DOB: [birth year]    ADMISSION DATE:  01/07/2015 CONSULTATION DATE:  01/19/2015 LOS 12  REFERRING MD :  EDP  CHIEF COMPLAINT:  AMS  INITIAL PRESENTATION:  [name] brought to [facility] ED 5/25 with AMS likely due to hypernatremia as well as aspiration PNA.  In ED, intubated for GCS 3 and PCCM called for admission.  STUDIES:  CXR 5/25 >>> hazy right infrahilar atx or infiltrate. CT head 5/25 >>> no acute process.  Remote right MCA infarct.  Chronic microvascular ischemic changes. 5/26 - eeg>>>generalized non-specific cerebral dysfunction(encephalopathy). There was no seizure  Ct head #2>>>old subacute rt mca,- this is c/w left side weakness. In addition chroinic cerebellar and L MCA infarct similar to 2013.   SIGNIFICANT EVENTS: 5/25 - admitted with AMS, probable aspiration PNA, (cutlures ultimately grew MSSA) multiple metabolic derangements. 5/26- pupil change, to CT now, fevers, line was placed 01/12/15: Dr Kendrick Fries discussion with sister - > Baseline has been hospitalized since December 2015, started with mental issues, then aspiration pneumonia and recurrent c.diff.  During that time she has had intermittent ability to have conversations which are riddled with delusions and hallucinations.  Sister would like to try to get her off the vent, but she agrees that CPR would be inappropriate (likelihood of meaningful recovery would be very low if she had a cardiac arrest).  Will change code status to limited code blue, continue efforts to get her off the vent this week. Also C Diff positive (reportedly 3rd episode)  01/13/15: Per RN - EEG x 2 is encephalopathy. MRI not possible due to pacer.Followed 1 command with squeeze yesterday. Still not noticed to move lowers. RUE slightly stronger 2/5 compare to LUE 1/5.  Also rhythmic movement of mouth noted today.   01/14/15: Per paliative MD -- patient had abrupt decline in sept and since  then hospitalzed mostly in psych unit.   01/16/15: ENT consult noted - mastoid effusions related to intubaiton. Needs serial fu in several weeks     SUBJECTIVE/OVERNIGHT/INTERVAL HX Low RR on SBTs Afebrile Remains catatonic - RASS 0  VITAL SIGNS: Temp:  [97.3 F (36.3 C)-98.2 F (36.8 C)] 97.5 F (36.4 C) (06/06 0816) Pulse Rate:  [69-81] 74 (06/06 0834) Resp:  [20-30] 24 (06/06 0834) BP: (83-131)/(42-69) 131/54 mmHg (06/06 0834) SpO2:  [98 %-100 %] 100 % (06/06 0834) FiO2 (%):  [30 %-40 %] 40 % (06/06 0834) Weight:  [79.3 kg (174 lb 13.2 oz)] 79.3 kg (174 lb 13.2 oz) (06/06 0500) HEMODYNAMICS:   VENTILATOR SETTINGS: Vent Mode:  [-] PRVC FiO2 (%):  [30 %-40 %] 40 % Set Rate:  [24 bmp] 24 bmp Vt Set:  [530 mL] 530 mL PEEP:  [5 cmH20] 5 cmH20 Pressure Support:  [5 cmH20-8 cmH20] 8 cmH20 Plateau Pressure:  [17 cmH20-19 cmH20] 18 cmH20 INTAKE / OUTPUT: Intake/Output      06/05 0701 - 06/06 0700 06/06 0701 - 06/07 0700   I.V. (mL/kg) 250 (3.2) 10 (0.1)   NG/GT 1310 50   IV Piggyback 210 105   Total Intake(mL/kg) 1770 (22.3) 165 (2.1)   Urine (mL/kg/hr) 3400 (1.8) 330 (2)   Stool     Total Output 3400 330   Net -1630 -165          PHYSICAL EXAMINATION:  Gen; Critically and Chronically ill appearing, on vent HENT: NCAT ETT in place PULM: sync with vent CV: RRR, no mgr GI: BS+,  soft, nontender MSK: normal bulk, tone Derm: edema/anasarca all over, fingers dusky and scattered bruises  Neuro :  RASS -1 for MD . OVerall lethargic-catatonic, does not follow commands   LABS:  PULMONARY  Recent Labs Lab 01/17/15 0400  PHART 7.440  PCO2ART 31.5*  PO2ART 87.9  HCO3 21.0  TCO2 22.0  O2SAT 97.8    CBC  Recent Labs Lab 01/17/15 0313 01/18/15 0402 01/19/15 0525  HGB 7.4* 8.2* 8.3*  HCT 23.6* 26.5* 27.3*  WBC 8.8 12.2* 9.9  PLT 163 168 182    COAGULATION  Recent Labs Lab 01/13/15 1215 01/14/15 0445  INR 1.59* 1.41    CARDIAC  No results for  input(s): TROPONINI in the last 168 hours. No results for input(s): PROBNP in the last 168 hours.   CHEMISTRY  Recent Labs Lab 01/15/15 0440 01/16/15 0415 01/17/15 0313 01/18/15 0402 01/18/15 1000 01/19/15 0525  NA 142 140 143 145 145 147*  K 4.1 4.1 4.2 5.7* 4.9 4.5  CL 114* 113* 115* 114* 112* 109  CO2 19* 21* 23 25 27 27   GLUCOSE 105* 113* 106* 113* 130* 111*  BUN 49* 47* 45* 44* 46* 56*  CREATININE 0.98 0.93 0.87 0.90 0.99 1.17*  CALCIUM 11.4* 11.5* 11.8* 12.3* 12.7* 12.7*  MG 2.3 2.0 1.9 2.4  --  2.2  PHOS 3.1 3.6 3.6 3.7  --  4.1   Estimated Creatinine Clearance: 52.8 mL/min (by C-G formula based on Cr of 1.17).   LIVER  Recent Labs Lab 01/13/15 1215 01/14/15 0445  AST 17 25  ALT 5* <5*  ALKPHOS 62 65  BILITOT 0.4 0.3  PROT 4.4* 4.6*  ALBUMIN 1.5* 1.5*  INR 1.59* 1.41     INFECTIOUS No results for input(s): LATICACIDVEN, PROCALCITON in the last 168 hours.   ENDOCRINE CBG (last 3)   Recent Labs  01/18/15 2346 01/19/15 0342 01/19/15 0817  GLUCAP 111* 105* 111*         IMAGING x48h  - iDg Chest Port 1 View  01/19/2015   CLINICAL DATA:  Shortness of breath.  EXAM: PORTABLE CHEST - 1 VIEW  COMPARISON:  01/18/2015.  FINDINGS: Patient rotated to the left. Endotracheal tube tip 1.8 cm above the carina. Right IJ line and NG tube in stable position. Mediastinum hilar structures are normal. Heart size stable. Cardiac pacer in stable position. Persistent bilateral lower lobe infiltrates and atelectatic changes are present. No interim change. No pleural effusion or pneumothorax.  IMPRESSION: 1. Lines and tubes in stable position. 2. Persistent bilateral lower lobe infiltrates and atelectasis. No interim change.   Electronically Signed   By: Maisie Fushomas  Register   On: 01/19/2015 07:09   Dg Chest Port 1 View  01/18/2015   CLINICAL DATA:  Acute respiratory failure. On ventilator.  Hypoxia  EXAM: PORTABLE CHEST - 1 VIEW  COMPARISON:  01/17/2015  FINDINGS: Patient is  rotated to the left on today's exam. Support lines and tubes in appropriate position. Bilateral lower lobe airspace disease is again seen, left side greater than right, without significant change. No definite pneumothorax visualized.  IMPRESSION: Left greater than right lower lobe airspace disease, without significant change.   Electronically Signed   By: Myles RosenthalJohn  Stahl M.D.   On: 01/18/2015 09:09        ASSESSMENT / PLAN:  PULMONARY OETT 5/25 >>> A: VDRF due to inability to protect airway, MSSA pneumonia, acute pulm edema  P:   SBT based on criteria, Mental status limiting extubation, need goals  of care complete as well -would advocate for no reintubation VAP bundle   INFECTIOUS BCx2 5/25 >ng UCx 5/25 > ng Sputum Cx 5/25 >MSSA 5/29 C diff Positive  A:   Septic shock > resolved MSSA pneumonia 01/07/15  Completed 7d Ancef  C diff, diagnosed 5/29; family says this is the third time in 6 weeks   On Recurrent C Diff Rx P:    Vanc, start date 5/25>>> 5/29 Cefepime, start date 5/25>>>5/27 Cefazolin 5/27>>>6/1 ................... Flagyl 5/29 x1 Oral vanc higher dose  q6h 5/29 > 01/15/15, 01/15/15 (lower dose of  q6h)  >> (14 days) Fidaxomicin  bid > (14 days)  NEUROLOGIC A:   Acute metabolic encephalopathy > persistent 5/29, likely multifactorial from sepsis, hypernatremia Hx bipolar 1 disorder > family says recent use of Lithium has been harmful to mental status; Prior to 07/2014 was living independently Hx of CVA, dense left sided hemiplegia  - 01/16/15:CT with  left mastoid effusion due to intubation.    P:   Repeat CT early July 2016 for mastoid effusion Sedation: NONE  RASS goal: 0 Daily WUA. Continue to hold outpatient benztropine, diazepam, fluphenazine, norco, meclizine, quetiapine, trazodone Maintain keppra PO Appreciate neuro input  ? Consider adderall   CARDIOVASCULAR A:  Septic shock > resolved Hx HTN, bradycardia s/p PPM, LVH Poor cap refill  ext, currently with normal BP   P:  Monitor BP   RENAL   A:   AKI - due to lasix Hypernatremia resolved.    - hyperkalemia mild due to kcl supplementation  P   DC kcl Dc diuresis  Free water with tube feeds for maintenance Monitor BMET and UOP Replace electrolytes as needed   GASTROINTESTINAL A:   GI prophylaxis Nutrition   P:   SUP: H2 blockade in setting of C Diff Continue TF Avoid reglan due to pscyh issues (was on it in ICU)   HEMATOLOGY  A:   VTE Prophylaxis Coagulopathy > uncertain etiology, resolved  P:  Monitor for bleeding  ENDOCRINE A:   Hyperglycemia - no hx of DM P:   SSI if glucose consistently > 180   Family updated:  Discussed with sister Leanne 5/30. -appaently avoiding discussion, planned for 6/7  Interdisciplinary Family Meeting v Palliative Care Meeting: ongoing  GOBAL Slowly improving but big picture very poor. Not a good candidate for LTAC, [facility] Would advocate for one way extubation once POA on board  The patient is critically ill with multiple organ systems failure and requires high complexity decision making for assessment and support, frequent evaluation and titration of therapies, application of advanced monitoring technologies and extensive interpretation of multiple databases. Critical Care Time devoted to patient care services described in this note independent of APP time is 35 minutes.    Cyril Mourning MD. Tonny Bollman. [place] [facility] Pager [phone] If no response call [phone]     01/19/2015 9:04 AM

## 2015-01-20 ENCOUNTER — Inpatient Hospital Stay (HOSPITAL_COMMUNITY): Payer: Medicare Other

## 2015-01-20 DIAGNOSIS — A0472 Enterocolitis due to Clostridium difficile, not specified as recurrent: Secondary | ICD-10-CM | POA: Insufficient documentation

## 2015-01-20 LAB — BASIC METABOLIC PANEL
ANION GAP: 9 (ref 5–15)
BUN: 70 mg/dL — AB (ref 6–20)
CHLORIDE: 107 mmol/L (ref 101–111)
CO2: 27 mmol/L (ref 22–32)
CREATININE: 1.24 mg/dL — AB (ref 0.44–1.00)
Calcium: 12 mg/dL — ABNORMAL HIGH (ref 8.9–10.3)
GFR calc Af Amer: 53 mL/min — ABNORMAL LOW (ref >60)
GFR calc non Af Amer: 46 mL/min — ABNORMAL LOW (ref >60)
GLUCOSE: 114 mg/dL — AB (ref 65–99)
POTASSIUM: 4.2 mmol/L (ref 3.5–5.1)
SODIUM: 143 mmol/L (ref 135–145)

## 2015-01-20 LAB — GLUCOSE, CAPILLARY
GLUCOSE-CAPILLARY: 129 mg/dL — AB (ref 65–99)
GLUCOSE-CAPILLARY: 93 mg/dL (ref 65–99)
GLUCOSE-CAPILLARY: 90 mg/dL (ref 65–99)
Glucose-Capillary: 111 mg/dL — ABNORMAL HIGH (ref 65–99)
Glucose-Capillary: 107 mg/dL — ABNORMAL HIGH (ref 65–99)
Glucose-Capillary: 113 mg/dL — ABNORMAL HIGH (ref 65–99)

## 2015-01-20 LAB — CBC WITH DIFFERENTIAL/PLATELET
Basophils Absolute: 0 10*3/uL (ref 0.0–0.1)
Basophils Relative: 0 % (ref 0–1)
EOS ABS: 0.1 10*3/uL (ref 0.0–0.7)
EOS PCT: 1 % (ref 0–5)
HEMATOCRIT: 27.1 % — AB (ref 36.0–46.0)
HEMOGLOBIN: 8.1 g/dL — AB (ref 12.0–15.0)
Lymphocytes Relative: 18 % (ref 12–46)
Lymphs Abs: 2 10*3/uL (ref 0.7–4.0)
MCH: 26.8 pg (ref 26.0–34.0)
MCHC: 29.9 g/dL — ABNORMAL LOW (ref 30.0–36.0)
MCV: 89.7 fL (ref 78.0–100.0)
Monocytes Absolute: 0.8 10*3/uL (ref 0.1–1.0)
Monocytes Relative: 7 % (ref 3–12)
NEUTROS ABS: 8 10*3/uL — AB (ref 1.7–7.7)
Neutrophils Relative %: 74 % (ref 43–77)
PLATELETS: 173 10*3/uL (ref 150–400)
RBC: 3.02 MIL/uL — ABNORMAL LOW (ref 3.87–5.11)
RDW: 20.6 % — ABNORMAL HIGH (ref 11.5–15.5)
WBC: 10.9 10*3/uL — ABNORMAL HIGH (ref 4.0–10.5)

## 2015-01-20 LAB — MAGNESIUM: MAGNESIUM: 2.2 mg/dL (ref 1.7–2.4)

## 2015-01-20 LAB — PHOSPHORUS: Phosphorus: 5 mg/dL — ABNORMAL HIGH (ref 2.5–4.6)

## 2015-01-20 MED ORDER — VITAL AF 1.2 CAL PO LIQD
1000.0000 mL | ORAL | Status: DC
  Administered 2015-01-20: 1000 mL
  Filled 2015-01-20 (×6): qty 1000

## 2015-01-20 NOTE — Progress Notes (Signed)
Nutrition Follow-up   INTERVENTION:   Once enteral feeding tube replaced, resume TF with Vital AF 1.2 at 60 ml/h to provide 1728 kcals, 108 gm protein, 1168 ml free water daily.  NUTRITION DIAGNOSIS:  Inadequate oral intake related to inability to eat as evidenced by NPO status.  Ongoing  GOAL:  Patient will meet greater than or equal to 90% of their needs  Unmet  MONITOR:  TF tolerance, Weight trends, Labs, Diet advancement  REASON FOR ASSESSMENT:  Consult Enteral/tube feeding initiation and management  ASSESSMENT: [facility] with AMS likely due to hypernatremia as well as aspiration PNA.  Required intubation on 5/25, extubated today. OGT out and TF off. RN to replace feeding tube for resuming TF. Previously receiving Vital AF 1.2 at 50 ml/h with Prostat BID. Family still deciding on goals of care (? Trach/PEG vs comfort care if patient does not do well off the ventilator). Palliative Care Team is following.   Height:  Ht Readings from Last 1 Encounters:  01/07/15 5\' 9"  (1.753 m)    Weight:  Wt Readings from Last 1 Encounters:  01/20/15 173 lb 11.6 oz (78.8 kg)    Ideal Body Weight:  66 kg  Wt Readings from Last 10 Encounters:  01/20/15 173 lb 11.6 oz (78.8 kg)  07/21/14 228 lb 9 oz (103.675 kg)  07/11/14 230 lb (104.327 kg)  12/31/13 250 lb (113.399 kg)  12/15/12 228 lb 2 oz (103.477 kg)  05/20/11 238 lb 12.8 oz (108.319 kg)    BMI:  Body mass index is 25.64 kg/(m^2).  Estimated Nutritional Needs:  Kcal:  1700-1900  Protein:  90-105 gm  Fluid:  1.7-1.9 L  Skin:  Wound (see comment) (Cyanosis, MSAD groin, Stage II pressure ulcer on sacrum)  Diet Order:   NPO  EDUCATION NEEDS:  Education needs no appropriate at this time   Intake/Output Summary (Last 24 hours) at 01/20/15 1550 Last data filed at 01/20/15 1300  Gross per 24 hour  Intake   1280 ml  Output    870 ml  Net    410 ml    Last BM:  6/6   Joaquin CourtsKimberly  Kaoir Loree, RD, LDN, CNSC Pager [phone] After Hours Pager [phone]

## 2015-01-20 NOTE — Progress Notes (Signed)
PULMONARY / CRITICAL CARE MEDICINE   Name: Kellie Velez MRN: [id number] DOB: 1955-10-14    ADMISSION DATE:  01/07/2015 CONSULTATION DATE:  01/20/2015 LOS 13  REFERRING MD :  EDP  CHIEF COMPLAINT:  AMS  INITIAL PRESENTATION:  [name] brought to [facility] ED 5/25 with AMS likely due to hypernatremia as well as aspiration PNA.  In ED, intubated for GCS 3 and PCCM called for admission.  Baseline has been hospitalized since December 2015, started with mental issues, then aspiration pneumonia and recurrent c.diff. During that time she has had intermittent ability to have conversations which are riddled with delusions and hallucinations  STUDIES:  CXR 5/25 >>> hazy right infrahilar atx or infiltrate. CT head 5/25 >>> no acute process.  Remote right MCA infarct.  Chronic microvascular ischemic changes. 5/26 - eeg>>>generalized non-specific cerebral dysfunction(encephalopathy). There was no seizure  Ct head #2>>>old subacute rt mca,- this is c/w left side weakness. In addition chroinic cerebellar and L MCA infarct similar to 2013.   SIGNIFICANT EVENTS: 5/25 - admitted with AMS, probable aspiration PNA, (cutlures ultimately grew MSSA) multiple metabolic derangements. 5/26- pupil change, to CT now, fevers, line was placed 01/12/15: Dr Kendrick Fries discussion with sister - >limited code blue, continue efforts to get her off the vent this week. Also C Diff positive (reportedly 3rd episode)  01/13/15: Per RN - EEG x 2 is encephalopathy. MRI not possible due to pacer.Followed 1 command with squeeze yesterday. Still not noticed to move lowers. RUE slightly stronger 2/5 compare to LUE 1/5.  Also rhythmic movement of mouth noted today.   01/14/15: Per paliative MD -- patient had abrupt decline in sept and since then hospitalzed mostly in psych unit.   01/16/15: ENT consult noted - mastoid effusions related to intubaiton. Needs serial fu in several weeks     SUBJECTIVE/OVERNIGHT/INTERVAL HX Tolerating  SBTs Afebrile RASS 0, responds better to s-in law than to medical personnel  VITAL SIGNS: Temp:  [97.5 F (36.4 C)-99.7 F (37.6 C)] 98.4 F (36.9 C) (06/07 1111) Pulse Rate:  [68-94] 80 (06/07 1300) Resp:  [16-30] 24 (06/07 1300) BP: (88-121)/(43-70) 111/55 mmHg (06/07 1300) SpO2:  [96 %-100 %] 100 % (06/07 1300) FiO2 (%):  [30 %] 30 % (06/07 1200) Weight:  [78.8 kg (173 lb 11.6 oz)] 78.8 kg (173 lb 11.6 oz) (06/07 0500) HEMODYNAMICS:   VENTILATOR SETTINGS: Vent Mode:  [-] PSV;CPAP FiO2 (%):  [30 %] 30 % Set Rate:  [18 bmp] 18 bmp Vt Set:  [530 mL] 530 mL PEEP:  [5 cmH20] 5 cmH20 Pressure Support:  [8 cmH20] 8 cmH20 Plateau Pressure:  [14 cmH20-17 cmH20] 16 cmH20 INTAKE / OUTPUT: Intake/Output      06/06 0701 - 06/07 0700 06/07 0701 - 06/08 0700   I.V. (mL/kg) 240 (3) 60 (0.8)   NG/GT 1290 260   IV Piggyback 105    Total Intake(mL/kg) 1635 (20.7) 320 (4.1)   Urine (mL/kg/hr) 1465 (0.8) 60 (0.1)   Total Output 1465 60   Net +170 +260          PHYSICAL EXAMINATION:  Gen; Critically and Chronically ill appearing, on vent HENT: NCAT ETT in place PULM: sync with vent CV: RRR, no mgr GI: BS+, soft, nontender MSK: normal bulk, tone Derm: edema/anasarca all over, fingers dusky and scattered bruises  Neuro :  RASS -1 for MD . OVerall lethargic-catatonic, does not follow commands   LABS:  PULMONARY  Recent Labs Lab 01/17/15 0400  PHART 7.440  PCO2ART 31.5*  PO2ART 87.9  HCO3 21.0  TCO2 22.0  O2SAT 97.8    CBC  Recent Labs Lab 01/18/15 0402 01/19/15 0525 01/20/15 0500  HGB 8.2* 8.3* 8.1*  HCT 26.5* 27.3* 27.1*  WBC 12.2* 9.9 10.9*  PLT 168 182 173    COAGULATION  Recent Labs Lab 01/14/15 0445  INR 1.41    CARDIAC  No results for input(s): TROPONINI in the last 168 hours. No results for input(s): PROBNP in the last 168 hours.   CHEMISTRY  Recent Labs Lab 01/16/15 0415 01/17/15 0313 01/18/15 0402 01/18/15 1000 01/19/15 0525  01/20/15 0500  NA 140 143 145 145 147* 143  K 4.1 4.2 5.7* 4.9 4.5 4.2  CL 113* 115* 114* 112* 109 107  CO2 21* 23 25 27 27 27   GLUCOSE 113* 106* 113* 130* 111* 114*  BUN 47* 45* 44* 46* 56* 70*  CREATININE 0.93 0.87 0.90 0.99 1.17* 1.24*  CALCIUM 11.5* 11.8* 12.3* 12.7* 12.7* 12.0*  MG 2.0 1.9 2.4  --  2.2 2.2  PHOS 3.6 3.6 3.7  --  4.1 5.0*   Estimated Creatinine Clearance: 49.8 mL/min (by C-G formula based on Cr of 1.24).   LIVER  Recent Labs Lab 01/14/15 0445  AST 25  ALT <5*  ALKPHOS 65  BILITOT 0.3  PROT 4.6*  ALBUMIN 1.5*  INR 1.41     INFECTIOUS No results for input(s): LATICACIDVEN, PROCALCITON in the last 168 hours.   ENDOCRINE CBG (last 3)   Recent Labs  01/20/15 0516 01/20/15 0817 01/20/15 1109  GLUCAP 107* 113* 129*         IMAGING x48h  - iDg Chest Port 1 View  01/19/2015   CLINICAL DATA:  Shortness of breath.  EXAM: PORTABLE CHEST - 1 VIEW  COMPARISON:  01/18/2015.  FINDINGS: Patient rotated to the left. Endotracheal tube tip 1.8 cm above the carina. Right IJ line and NG tube in stable position. Mediastinum hilar structures are normal. Heart size stable. Cardiac pacer in stable position. Persistent bilateral lower lobe infiltrates and atelectatic changes are present. No interim change. No pleural effusion or pneumothorax.  IMPRESSION: 1. Lines and tubes in stable position. 2. Persistent bilateral lower lobe infiltrates and atelectasis. No interim change.   Electronically Signed   By: Maisie Fushomas  Register   On: 01/19/2015 07:09        ASSESSMENT / PLAN:  PULMONARY OETT 5/25 >>> A: VDRF due to inability to protect airway, MSSA pneumonia, acute pulm edema  P:   SBT based on criteria, Mental status limiting extubation somewhat improved -see below for  goals of care -would advocate for no reintubation VAP bundle   INFECTIOUS BCx2 5/25 >ng UCx 5/25 > ng Sputum Cx 5/25 >MSSA 5/29 C diff Positive  A:   Septic shock > resolved MSSA  pneumonia 01/07/15  Completed 7d Ancef  C diff, diagnosed 5/29; family says this is the third time in 6 weeks   On Recurrent C Diff Rx P:    Vanc, start date 5/25>>> 5/29 Cefepime, start date 5/25>>>5/27 Cefazolin 5/27>>>6/1 ................... Flagyl 5/29 x1 Oral vanc higher dose 500mg  q6h 5/29 > 01/15/15, 01/15/15 (lower dose of 125mg  q6h)  >> (14 days) Fidaxomicin 200mg  bid > (14 days)  NEUROLOGIC A:   Acute metabolic encephalopathy > persistent 5/29, likely multifactorial from sepsis, hypernatremia Hx bipolar 1 disorder > family says recent use of Lithium has been harmful to mental status; Prior to 07/2014 was living independently Hx of CVA, dense  left sided hemiplegia  - 01/16/15:CT with  left mastoid effusion due to intubation.    P:   Repeat CT early July 2016 for mastoid effusion Sedation: NONE  RASS goal: 0 Daily WUA. Continue to hold outpatient benztropine, diazepam, fluphenazine, norco, meclizine, quetiapine, trazodone Maintain keppra PO Appreciate neuro input  ? Consider adderall   CARDIOVASCULAR A:  Septic shock > resolved Hx HTN, bradycardia s/p PPM, LVH Poor cap refill ext, currently with normal BP  P:  Monitor BP   RENAL  A:   AKI - due to lasix Hypernatremia resolved.   P    Free water with tube feeds for maintenance Monitor BMET and UOP Replace electrolytes as needed   GASTROINTESTINAL A:   GI prophylaxis Nutrition   P:   SUP: H2 blockade in setting of C Diff Continue TF Avoid reglan due to pscyh issues (was on it in ICU)   HEMATOLOGY  A:   VTE Prophylaxis Coagulopathy > uncertain etiology, resolved  P:  Monitor for bleeding  ENDOCRINE A:   Hyperglycemia - no hx of DM P:   SSI if glucose consistently > 180   Family updated:  Discussed with sister Alesia Banda  6/7 >> see palliative care note  Interdisciplinary Family Meeting v Palliative Care Meeting: ongoing  GOBAL Slowly improving but big picture very poor. Not a good  candidate for LTAC, [facility] Would advocate for one way extubation but Leanne prefers trial of extubation first & will not commit to no trach  The patient is critically ill with multiple organ systems failure and requires high complexity decision making for assessment and support, frequent evaluation and titration of therapies, application of advanced monitoring technologies and extensive interpretation of multiple databases. Critical Care Time devoted to patient care services described in this note independent of APP time is 35 minutes.    Cyril Mourning MD. Tonny Bollman. [facility] Pager [phone] If no response call [phone]     01/20/2015 1:51 PM

## 2015-01-20 NOTE — Progress Notes (Signed)
Family meeting with HCPOA Leann.   1. Extubate today if weaning- attempt all measures to prevent re-intubation or decomensation- Full Scope Medical Treatment.  2. In the event of rapid decline-they would want re-intubation in order to get family gathered at which point they would make a decision regarding transition to comfort care or to proceed with Trach/PEG- Leann was clear that she had not taken Trach off the table- but wanted Corrie DandyMary to have one chance at not going in the direction.  3. I discussed the high likleyhood of decompensation-her risk for not progressing and complications from immobility, underlying mental status issues and the possibility of irreversible aspiration which may have contributed to her current condition.  4. While Leann feels just a few months ago Corrie DandyMary was healthy- further history would suggest that she actually has not been "well" for quite some time.  5. Family reasonable but they are also trying to get some of Joette's affairs in order-she has no POA or will and family requesting social work consult to assist with these issues or in making referrals for help in this area. They may be delaying a next step hoping to get these issues taken care of in case of Cecilee's rapid decline or death.  6. Geoffery SpruceLeann tells me Corrie DandyMary is a DNR- that in teh event of cardiac arrest she should not receive compressions, defib or ACLS in general- but be treated for respiratory arrest with re-intubation or bipap if needed.  I shared with her my position that "one-way extubation" should really be just that and if she elects for reintubation that should be a decision for tracheostomy-Leann however specifically requested the plan above-and it may be fairly reasonable given Adeline's somewhat nebulous underlying condition leading up to her current condition...ie PNA ?aspiration and AMS.  We will continue to follow- hopeful that we can help family with further decision making following extubation.  Anderson MaltaElizabeth  Naziyah Tieszen, DO Palliative Medicine [phone]

## 2015-01-20 NOTE — Procedures (Signed)
Extubation Procedure Note  Patient Details:   Name: Kellie Velez DOB: 03/21/1956 MRN: [id number]  Pt extubated to 4L [place] per MD order. Pt able to vocalize, no stridor noted. Pt tolerating well at this time. RT to monitor.    Evaluation  O2 sats: stable throughout Complications: No apparent complications Patient did tolerate procedure well. Bilateral Breath Sounds: Clear, Diminished Suctioning: Airway Yes  Harley HallmarkKeen, Katriana Dortch Lyman 01/20/2015, 2:39 PM

## 2015-01-21 DIAGNOSIS — R4182 Altered mental status, unspecified: Secondary | ICD-10-CM

## 2015-01-21 LAB — CBC WITH DIFFERENTIAL/PLATELET
Basophils Absolute: 0 10*3/uL (ref 0.0–0.1)
Basophils Relative: 0 % (ref 0–1)
Eosinophils Absolute: 0.1 10*3/uL (ref 0.0–0.7)
Eosinophils Relative: 2 % (ref 0–5)
HCT: 26.9 % — ABNORMAL LOW (ref 36.0–46.0)
Hemoglobin: 8 g/dL — ABNORMAL LOW (ref 12.0–15.0)
LYMPHS PCT: 18 % (ref 12–46)
Lymphs Abs: 1.5 10*3/uL (ref 0.7–4.0)
MCH: 27.3 pg (ref 26.0–34.0)
MCHC: 29.7 g/dL — ABNORMAL LOW (ref 30.0–36.0)
MCV: 91.8 fL (ref 78.0–100.0)
MONO ABS: 0.6 10*3/uL (ref 0.1–1.0)
Monocytes Relative: 7 % (ref 3–12)
Neutro Abs: 6.1 10*3/uL (ref 1.7–7.7)
Neutrophils Relative %: 73 % (ref 43–77)
PLATELETS: 152 10*3/uL (ref 150–400)
RBC: 2.93 MIL/uL — AB (ref 3.87–5.11)
RDW: 20.3 % — ABNORMAL HIGH (ref 11.5–15.5)
WBC: 8.4 10*3/uL (ref 4.0–10.5)

## 2015-01-21 LAB — GLUCOSE, CAPILLARY
GLUCOSE-CAPILLARY: 84 mg/dL (ref 65–99)
GLUCOSE-CAPILLARY: 98 mg/dL (ref 65–99)
GLUCOSE-CAPILLARY: 99 mg/dL (ref 65–99)
Glucose-Capillary: 80 mg/dL (ref 65–99)
Glucose-Capillary: 100 mg/dL — ABNORMAL HIGH (ref 65–99)
Glucose-Capillary: 100 mg/dL — ABNORMAL HIGH (ref 65–99)

## 2015-01-21 LAB — BASIC METABOLIC PANEL
Anion gap: 8 (ref 5–15)
BUN: 67 mg/dL — ABNORMAL HIGH (ref 6–20)
CHLORIDE: 110 mmol/L (ref 101–111)
CO2: 28 mmol/L (ref 22–32)
Calcium: 11.8 mg/dL — ABNORMAL HIGH (ref 8.9–10.3)
Creatinine, Ser: 1.17 mg/dL — ABNORMAL HIGH (ref 0.44–1.00)
GFR calc non Af Amer: 49 mL/min — ABNORMAL LOW (ref >60)
GFR, EST AFRICAN AMERICAN: 57 mL/min — AB (ref >60)
Glucose, Bld: 101 mg/dL — ABNORMAL HIGH (ref 65–99)
Potassium: 4.4 mmol/L (ref 3.5–5.1)
Sodium: 146 mmol/L — ABNORMAL HIGH (ref 135–145)

## 2015-01-21 LAB — PHOSPHORUS: Phosphorus: 5.7 mg/dL — ABNORMAL HIGH (ref 2.5–4.6)

## 2015-01-21 LAB — MAGNESIUM: Magnesium: 2.4 mg/dL (ref 1.7–2.4)

## 2015-01-21 MED ORDER — AMPHETAMINE-DEXTROAMPHETAMINE 10 MG PO TABS
10.0000 mg | ORAL_TABLET | Freq: Every day | ORAL | Status: DC
  Administered 2015-01-21 – 2015-01-27 (×5): 10 mg via ORAL
  Filled 2015-01-21 (×5): qty 1

## 2015-01-21 NOTE — Progress Notes (Signed)
PULMONARY / CRITICAL CARE MEDICINE   Name: Kellie Velez MRN: [id number] DOB: 11-04-1955    ADMISSION DATE:  01/07/2015 CONSULTATION DATE:  01/21/2015 LOS 14  REFERRING MD :  EDP  CHIEF COMPLAINT:  AMS  INITIAL PRESENTATION:  [name] brought to [facility] ED 5/25 with AMS likely due to hypernatremia as well as aspiration PNA.  In ED, intubated for GCS 3 and PCCM called for admission.  Baseline has been hospitalized since December 2015, started with mental issues, then aspiration pneumonia and recurrent c.diff. 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 CT head 5/25 >>> no acute process.  Remote right MCA infarct.  Chronic microvascular ischemic changes. 5/26 - eeg>>>generalized non-specific cerebral dysfunction(encephalopathy). There was no seizure  Ct head #2>>>old subacute rt mca,- this is c/w left side weakness. In addition chroinic cerebellar and L MCA infarct similar to 2013.   SIGNIFICANT EVENTS: 5/25 - admitted with AMS, probable aspiration PNA, (cutlures ultimately grew MSSA) multiple metabolic derangements. 5/26- pupil change, to CT now, fevers, line was placed 01/12/15: Dr Kendrick Fries discussion with s-in law - >limited code blue, continue efforts to get her off the vent this week. Also C Diff positive (reportedly 3rd episode)  01/13/15: Per RN - EEG x 2 is encephalopathy. MRI not possible due to pacer.Followed 1 command with squeeze yesterday. Still not noticed to move lowers. RUE slightly stronger 2/5 compare to LUE 1/5.  Also rhythmic movement of mouth noted today.   01/14/15: Per paliative MD -- patient had abrupt decline in sept and since then hospitalzed mostly in psych unit.  01/16/15: ENT consult noted - mastoid effusions related to intubaiton. Needs serial fu in several weeks  6/7  extubated  SUBJECTIVE/OVERNIGHT/INTERVAL HX Weak cough Afebrile RASS 0, denies pain  VITAL SIGNS: Temp:  [97.4 F (36.3 C)-98.4 F (36.9 C)] 98 F (36.7 C) (06/08 0351) Pulse Rate:  [25-81] 71 (06/08 1000) Resp:  [15-27] 20 (06/08 1000) BP: (84-113)/(40-89) 88/46 mmHg (06/08 1000) SpO2:  [92 %-100 %] 97 % (06/08 1000) FiO2 (%):  [30 %] 30 % (06/07 1200) Weight:  [175 lb 4.3 oz (79.5 kg)] 175 lb 4.3 oz (79.5 kg) (06/08 0441) HEMODYNAMICS:   VENTILATOR SETTINGS: Vent Mode:  [-] PSV;CPAP FiO2 (%):  [30 %] 30 % PEEP:  [5 cmH20] 5 cmH20 Pressure Support:  [8 cmH20] 8 cmH20 INTAKE / OUTPUT: Intake/Output      06/07 0701 - 06/08 0700 06/08 0701 - 06/09 0700   I.V. (mL/kg) 190 (2.4) 30 (0.4)   NG/GT 1010 210   IV Piggyback     Total Intake(mL/kg) 1200 (15.1) 240 (3)   Urine (mL/kg/hr) 1440 (0.8) 200 (0.8)   Stool 50 (0)    Total Output 1490 200   Net -290 +40          PHYSICAL EXAMINATION:  Gen; Critically and Chronically ill appearing,  HENT: NCAT , weak cough PULM: decreased BL CV: RRR, no mgr GI: BS+, soft, nontender MSK: normal bulk, tone Derm: edema/anasarca all over, fingers dusky and scattered bruises  Neuro :  RASS 0 . OVerall lethargic-catatonic,  follows commands int   LABS:  PULMONARY  Recent Labs Lab 01/17/15 0400  PHART 7.440  PCO2ART 31.5*  PO2ART 87.9  HCO3 21.0  TCO2 22.0  O2SAT 97.8    CBC  Recent Labs  Lab 01/19/15 0525 01/20/15 0500 01/21/15 0430  HGB 8.3* 8.1* 8.0*  HCT 27.3* 27.1* 26.9*  WBC 9.9 10.9* 8.4  PLT 182 173 152    COAGULATION No results for input(s): INR in the last 168 hours.  CARDIAC  No results for input(s): TROPONINI in the last 168 hours. No results for input(s): PROBNP in the last 168 hours.   CHEMISTRY  Recent Labs Lab 01/17/15 0313 01/18/15 0402 01/18/15 1000 01/19/15 0525 01/20/15 0500 01/21/15 0430  NA 143 145 145 147* 143 146*  K 4.2 5.7* 4.9 4.5 4.2 4.4  CL 115* 114* 112* 109 107 110   CO2 23 25 27 27 27 28   GLUCOSE 106* 113* 130* 111* 114* 101*  BUN 45* 44* 46* 56* 70* 67*  CREATININE 0.87 0.90 0.99 1.17* 1.24* 1.17*  CALCIUM 11.8* 12.3* 12.7* 12.7* 12.0* 11.8*  MG 1.9 2.4  --  2.2 2.2 2.4  PHOS 3.6 3.7  --  4.1 5.0* 5.7*   Estimated Creatinine Clearance: 57 mL/min (by C-G formula based on Cr of 1.17).   LIVER No results for input(s): AST, ALT, ALKPHOS, BILITOT, PROT, ALBUMIN, INR in the last 168 hours.   INFECTIOUS No results for input(s): LATICACIDVEN, PROCALCITON in the last 168 hours.   ENDOCRINE CBG (last 3)   Recent Labs  01/21/15 0012 01/21/15 0347 01/21/15 0750  GLUCAP 99 80 84         IMAGING x48h  - iDg Abd Portable 1v  01/20/2015   CLINICAL DATA:  Evaluate NG tube placement  EXAM: PORTABLE ABDOMEN - 1 VIEW  COMPARISON:  01/10/2015  FINDINGS: NG huge it is in the peripyloric region with the side port in the antrum. Prior cholecystectomy. Nonobstructive bowel gas pattern.  IMPRESSION: NG tube tip in the peripyloric region.   Electronically Signed   By: Charlett NoseKevin  Dover M.D.   On: 01/20/2015 17:15      ASSESSMENT / PLAN:  PULMONARY OETT 5/25 >>> 6/7 A: VDRF due to inability to protect airway, MSSA pneumonia, acute pulm edema -resolved  P:   -see below for  goals of care -would advocate for no reintubation VAP bundle   INFECTIOUS BCx2 5/25 >ng UCx 5/25 > ng Sputum Cx 5/25 >MSSA 5/29 C diff Positive  A:   Septic shock > resolved MSSA pneumonia 01/07/15  Completed 7d Ancef  C diff, diagnosed 5/29; family says this is the third time in 6 weeks   On Recurrent C Diff Rx P:    Vanc, start date 5/25>>> 5/29 Cefepime, start date 5/25>>>5/27 Cefazolin 5/27>>>6/1 ................... Flagyl 5/29 x1 Oral vanc higher dose 500mg  q6h 5/29 > 01/15/15, 01/15/15 (lower dose of 125mg  q6h)  >> (14 days) Fidaxomicin 200mg  bid > (14 days)  NEUROLOGIC A:   Acute metabolic encephalopathy > persistent 5/29, likely multifactorial from sepsis,  hypernatremia Hx bipolar 1 disorder > family says recent use of Lithium has been harmful to mental status; Prior to 07/2014 was living independently Hx of CVA, dense left sided hemiplegia  - 01/16/15:CT with  left mastoid effusion due to intubation.    P:   Repeat CT early July 2016 for mastoid effusion RASS goal: 0 Continue to hold outpatient zyprexa, lithium dc keppra PO Appreciate neuro input  ? Consider adderall   CARDIOVASCULAR A:  Septic shock > resolved Hx HTN, bradycardia s/p PPM, LVH Poor cap refill ext, currently with normal BP  P:  Monitor BP   RENAL  A:   AKI - due to lasix  Hypernatremia resolved, rising again   P    Free water with tube feeds for maintenance Monitor BMET and UOP Replace electrolytes as needed   GASTROINTESTINAL A:   GI prophylaxis Nutrition   P:   SUP: H2 blockade in setting of C Diff Continue TF Avoid reglan due to pscyh issues (was on it in ICU)   HEMATOLOGY  A:   VTE Prophylaxis Coagulopathy > uncertain etiology, resolved  P:  Monitor for bleeding  ENDOCRINE A:   Hyperglycemia - no hx of DM P:   SSI if glucose consistently > 180   Family updated:  Discussed with sister Leanne in detail  6/7  & 6/8>>  Advocated for no reintubation - she will consider note  Interdisciplinary Family Meeting v Palliative Care Meeting: ongoing  GOBAL Slowly improving but big picture very poor. Not a good candidate for LTAC, [facility] Would advocate no re intubation but Leanne  will not commit to no trach  The patient is critically ill with multiple organ systems failure and requires high complexity decision making for assessment and support, frequent evaluation and titration of therapies, application of advanced monitoring technologies and extensive interpretation of multiple databases. Critical Care Time devoted to patient care services described in this note independent of APP time is 31 minutes.    Cyril Mourning MD. Tonny Bollman.   Pulmonary & Critical care Pager [phone] If no response call [phone]     01/21/2015 10:18 AM

## 2015-01-21 NOTE — Progress Notes (Signed)
Daily Progress Note   Patient Name: Kellie Velez       Date: 01/21/2015 DOB: [birth year]  Age: 59 y.o. MRN#: [id number] Attending Physician: Oretha Milch, MD Primary Care Physician: [name], MD Admit Date: 01/07/2015  Reason for Consultation/Follow-up: Establishing goals of care  Subjective: Lethargic, nonverbal.  Little purposeful interaction.    Length of Stay: 14 days  Current Medications: Scheduled Meds:  . amphetamine-dextroamphetamine  10 mg Oral Q breakfast  . antiseptic oral rinse  7 mL Mouth Rinse QID  . chlorhexidine  15 mL Mouth Rinse BID  . famotidine  20 mg Per Tube BID  . fidaxomicin  200 mg Oral BID  . free water  200 mL Per Tube Q6H  . heparin  5,000 Units Subcutaneous 3 times per day  . levETIRAcetam  500 mg Per Tube Q12H  . vancomycin  125 mg Oral 4 times per day    Continuous Infusions: . feeding supplement (VITAL AF 1.2 CAL) 1,000 mL (01/20/15 2000)    PRN Meds: sodium chloride, acetaminophen  Palliative Performance Scale: 20%     Vital Signs: BP 102/53 mmHg  Pulse 81  Temp(Src) 97.4 F (36.3 C) (Oral)  Resp 27  Ht  (1.753 m)  Wt 79.5 kg (175 lb 4.3 oz)  BMI 25.87 kg/m2  SpO2 96% SpO2: SpO2: 96 % O2 Device: O2 Device: Nasal Cannula O2 Flow Rate: O2 Flow Rate (L/min): 6 L/min  Intake/output summary:  Intake/Output Summary (Last 24 hours) at 01/21/15 1418 Last data filed at 01/21/15 1200  Gross per 24 hour  Intake   1260 ml  Output   1180 ml  Net     80 ml  Baseline Weight: Weight: 103.42 kg (228 lb) Most recent weight: Weight: 79.5 kg (175 lb 4.3 oz)  Physical Exam: GEN: lethargic, NAD HEENT: [place], sclera anicteric CV: reg rate LUNGS: Coarse ABD: soft, ND       Additional Data Reviewed: Recent Labs     01/20/15  0500  01/21/15  0430  WBC  10.9*  8.4  HGB  8.1*  8.0*  PLT  173  152  NA  143  146*  BUN  70*  67*  CREATININE  1.24*  1.17*     Problem List:  Patient Active Problem List   Diagnosis  Date Noted  . Colitis, Clostridium difficile   . Palliative care encounter   . Encephalopathy   . Acute pulmonary edema   . Acute respiratory failure with hypoxia   . Hypernatremia   . MSSA (methicillin susceptible Staphylococcus aureus) pneumonia   . Encephalopathy acute   . Pressure ulcer 01/09/2015  . ARF (acute respiratory failure)   . Pneumonia   . Pupil asymmetry   . Altered mental status 01/07/2015  . Respiratory failure   . Blood poisoning   . Bipolar disorder, current episode mixed, severe, with psychotic features   . Psychoses   . 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 Unspecified essential hypertension 09/28/2012  . Bipolar disorder, unspecified 09/28/2012  . TIA (transient ischemic attack) 09/28/2012  . Mild carotid artery disease 09/28/2012  . Osteoporosis, unspecified 09/28/2012  . Bradycardia 09/28/2012  . Heart murmur 09/28/2012  . Hearing loss 09/28/2012  . Arthritis 09/28/2012  . LVH (left ventricular hypertrophy) 09/28/2012     Palliative Care Assessment & Plan  59 yo female with PMHx of Bipolar, CVA, CAD who was admitted from [facility] on 5/21 with altered mental status. Prolonged admission for AMS and  resp failure.   Code Status:  Limited code  Goals of Care:  See pervious notes. Spoke with guardian Soledad GerlachLeigh Ann today and updated.  She is concerned about financial situation and trying to become financial POA.  Worried about what will happen if Kellie Velez were to have event and what would happen with her things. I will place SW consult to help with this.    Desire for further Chaplaincy support:no  3. Symptom Management:  Acute Encephalopathy- slowly resolving. High risk for setbacks  4. Prognosis: poor, high risk for setbacks even in next days to weeks  5. Discharge Planning: TBD   Total Time: 20 minutes Greater than 50%  of this time was spent counseling and coordinating care related to the above assessment and plan.   Orvis BrillAaron J Grayson White, DO  01/21/2015,  2:18 PM  Please contact Palliative Medicine Team phone at [phone] for questions and concerns.

## 2015-01-21 NOTE — Progress Notes (Signed)
Spoke with Dr. Vassie LollAlva about patient's bp being soft. Ok for SBP>=85 per Dr. Vassie LollAlva. Left message for patient's sister to call back. Patient is drowsy but follows simple commands.

## 2015-01-22 ENCOUNTER — Inpatient Hospital Stay (HOSPITAL_COMMUNITY): Payer: Medicare Other

## 2015-01-22 LAB — BASIC METABOLIC PANEL
Anion gap: 9 (ref 5–15)
BUN: 61 mg/dL — AB (ref 6–20)
CALCIUM: 11.2 mg/dL — AB (ref 8.9–10.3)
CO2: 24 mmol/L (ref 22–32)
Chloride: 109 mmol/L (ref 101–111)
Creatinine, Ser: 1.14 mg/dL — ABNORMAL HIGH (ref 0.44–1.00)
GFR calc non Af Amer: 51 mL/min — ABNORMAL LOW (ref >60)
GFR, EST AFRICAN AMERICAN: 59 mL/min — AB (ref >60)
Glucose, Bld: 118 mg/dL — ABNORMAL HIGH (ref 65–99)
Potassium: 4.2 mmol/L (ref 3.5–5.1)
SODIUM: 142 mmol/L (ref 135–145)

## 2015-01-22 LAB — GLUCOSE, CAPILLARY
GLUCOSE-CAPILLARY: 83 mg/dL (ref 65–99)
Glucose-Capillary: 70 mg/dL (ref 65–99)
Glucose-Capillary: 85 mg/dL (ref 65–99)
Glucose-Capillary: 96 mg/dL (ref 65–99)
Glucose-Capillary: 97 mg/dL (ref 65–99)

## 2015-01-22 MED ORDER — LIDOCAINE VISCOUS 2 % MT SOLN
OROMUCOSAL | Status: AC
  Filled 2015-01-22: qty 15

## 2015-01-22 NOTE — Progress Notes (Signed)
Pt transferred in from 51M, vital signs stable, no distress noted. During report requested foley be removed, per protocol, prior to transfer due to assessment of foley having no indication for continuation. Upon arrival foley was found to still be in pace and visibly soiled. Peri care preformed and foley removed. Will continue monitor.

## 2015-01-22 NOTE — Evaluation (Signed)
Physical Therapy Evaluation Patient Details Name: Kellie Velez MRN: [id number] DOB: 05/26/1956 Today's Date: 01/22/2015   History of Present Illness  Pt is a 59 y/o female who presents to the [facility] on 5/25 with AMS likely due to hypernatremia and aspiration PNA. Pt was intubated 5/25-6/7. PMH includes PPM, L TKA, TIA/CVA.  Clinical Impression  Pt admitted with above diagnosis. Pt currently with functional limitations due to the deficits listed below (see PT Problem List). At the time of PT eval pt was able to perform transfers with +2 total assistance. Pt was able to perform minimal AROM of LE's in sitting position, however in supine position demonstrated none. Pt will benefit from skilled PT to increase their independence and safety with mobility to allow discharge to the venue listed below.       Follow Up Recommendations SNF;Supervision/Assistance - 24 hour    Equipment Recommendations       Recommendations for Other Services       Precautions / Restrictions Precautions Precautions: Fall Restrictions Weight Bearing Restrictions: No      Mobility  Bed Mobility Overal bed mobility: Needs Assistance;+2 for physical assistance Bed Mobility: Rolling;Sidelying to Sit;Sit to Supine Rolling: Total assist Sidelying to sit: Total assist;+2 for physical assistance   Sit to supine: Total assist;+2 for physical assistance   General bed mobility comments: Assist required for all aspects of bed mobility. Bed pad used to complete scooting.   Transfers                 General transfer comment: Unable to progress mobility at this time.   Ambulation/Gait                Stairs            Wheelchair Mobility    Modified Rankin (Stroke Patients Only)       Balance Overall balance assessment: Needs assistance Sitting-balance support: Feet supported;Bilateral upper extremity supported Sitting balance-Leahy Scale: Zero Sitting balance - Comments: Total assist to  maintain sitting balance.                                      Pertinent Vitals/Pain Pain Assessment: Faces Faces Pain Scale: No hurt    Home Living Family/patient expects to be discharged to:: Skilled nursing facility                      Prior Function                 Hand Dominance        Extremity/Trunk Assessment   Upper Extremity Assessment: Defer to OT evaluation           Lower Extremity Assessment: Generalized weakness;RLE deficits/detail;LLE deficits/detail RLE Deficits / Details: Appeared to show more active movement in RLE vs. the left, however when PT entered the room the RLE was hanging off the side of the bed. Was able to raise RLE back into bed with heavy min assist.  LLE Deficits / Details: Minimal active movement noted. Was able to perform LAQ (minimal AROM) however was not able to hold it up against resistance as she was able to do on the RLE.      Communication   Communication: Expressive difficulties  Cognition Arousal/Alertness: Lethargic Behavior During Therapy: Flat affect Overall Cognitive Status: No family/caregiver present to determine baseline cognitive functioning  General Comments      Exercises        Assessment/Plan    PT Assessment Patient needs continued PT services  PT Diagnosis Difficulty walking;Generalized weakness   PT Problem List Decreased strength;Decreased range of motion;Decreased activity tolerance;Decreased balance;Decreased mobility;Decreased knowledge of use of DME;Decreased knowledge of precautions;Decreased safety awareness;Cardiopulmonary status limiting activity  PT Treatment Interventions DME instruction;Gait training;Stair training;Functional mobility training;Therapeutic activities;Therapeutic exercise;Neuromuscular re-education;Patient/family education   PT Goals (Current goals can be found in the Care Plan section) Acute Rehab PT Goals PT Goal  Formulation: Patient unable to participate in goal setting Time For Goal Achievement: 02/05/15 Potential to Achieve Goals: Good    Frequency Min 2X/week   Barriers to discharge        Co-evaluation               End of Session Equipment Utilized During Treatment: Oxygen Activity Tolerance: Patient limited by lethargy Patient left: in bed;with call bell/phone within reach Nurse Communication: Mobility status;Other (comment) (Recommended PRAFO boots)         Time: 1610-9604 PT Time Calculation (min) (ACUTE ONLY): 23 min   Charges:   PT Evaluation $Initial PT Evaluation Tier I: 1 Procedure PT Treatments $Therapeutic Activity: 8-22 mins   PT G Codes:        Conni Slipper 05-Feb-2015, 12:13 PM   Conni Slipper, PT, DPT Acute Rehabilitation Services Pager: [phone]

## 2015-01-22 NOTE — Clinical Social Work Note (Signed)
Patient is a resident of [place]. Clinical Social Worker will continue to follow patient and pt's family for continued support and to facilitate pt's discharge needs once medically stable.   FL-2 completed. CSW remains available as needed.  Derenda Fennel, MSW, LCSWA [phone] 01/22/2015 11:53 AM

## 2015-01-22 NOTE — Progress Notes (Signed)
Patient pulled out NG tube per night shift RN. Multiple attempts made by night shift to reinsert but without success. Day shift RN attempted twice without success as well. Will notify MD.

## 2015-01-22 NOTE — Care Management Note (Signed)
Case Management Note  Patient Details  Name: Kellie Velez MRN: [id number] Date of Birth: [birth year]  Subjective/Objective:                 Patient remains on [place] oxygen - looks better today.  Moving off to intermediate unit.  Patient has no Ltach days left. SW updated.   Action/Plan:   Expected Discharge Date:                  Expected Discharge Plan:  Skilled Nursing Facility  In-House Referral:  Clinical Social Work  Discharge planning Services  CM Consult  Post Acute Care Choice:    Choice offered to:     DME Arranged:    DME Agency:     HH Arranged:    HH Agency:     Status of Service:  In process, will continue to follow  Medicare Important Message Given:  Yes Date Medicare IM Given:  01/22/15 Medicare IM give by:  Avie Arenas, RNBSN Date Additional Medicare IM Given:    Additional Medicare Important Message give by:     If discussed at Long Length of Stay Meetings, dates discussed:   01-22-15  Additional Comments:  Vangie Bicker, RN 01/22/2015, 1:14 PM

## 2015-01-22 NOTE — Evaluation (Signed)
Clinical/Bedside Swallow Evaluation Patient Details  Name: Kellie Velez MRN: [id number] Date of Birth: May 16, 1956  Today's Date: 01/22/2015 Time: SLP Start Time (ACUTE ONLY): 1340 SLP Stop Time (ACUTE ONLY): 1400 SLP Time Calculation (min) (ACUTE ONLY): 20 min  Past Medical History:  Past Medical History  Diagnosis Date  . Arthritis   . Hypertension   . Nerve damage   . Osteoporosis   . Hearing loss   . Abdominal pain   . Stroke     TIA's  . Bradycardia, sinus, persistent, severe   . 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 Bipolar 1 disorder   . Heart murmur 08/13/2012    EF 60-65%,severe LVH,Mild AOV regurg, MR,trivial TR  . Mild carotid artery disease 05/05/2005,08/13/2012    left ICA 0-49% diameter reduction 2006; bilateral fibrous plaque mild 2013  . LVH (left ventricular hypertrophy)    Past Surgical History:  Past Surgical History  Procedure Laterality Date  . Cholecystectomy  1980  . Appendectomy  1983  . Total knee arthroplasty  2003    left  . Permanent pacemaker insertion  01/01/2005    Medtronic EnRhythm  . Permanent pacemaker generator change  06/08/2011    Medtronic Adapta  . Tee without cardioversion  01/06/2005    no cardiac source of embolus   HPI:  [name] brought to [facility] ED 5/25 with AMS likely due to hypernatremia as well as aspiration PNA.  Intubated 5/25-6/7.   PmHx bipolar disorder, CVA (dense left hemiplegia), CAD.  Has had acute encephalopathy with slow resolution; noted hallucinations and periods of catatonia. Palliative following.     Assessment / Plan / Recommendation Clinical Impression  Pt sufficiently alert/participatory for swallow assessment - with weak, hoarse phonation, weak cough, likely due to 13-day intubation, and leading to immediate cough, s/s aspiration after consumption of ice chips and water.  Able to tolerate boluses of puree without concerns for aspiration.  Given  fluctuating MS, prolonged intubation, and deconditioned state, recommend proceeding with MBS next date to determine potential for oral alimentation.  For today, continue NPO except meds crushed in puree.  D/W Dr. Vassie Loll and RN.      Aspiration Risk  Severe    Diet Recommendation NPO except meds   Medication Administration: Crushed with puree    Other  Recommendations Oral Care Recommendations: Oral care QID   Follow Up Recommendations    MBS next date   Frequency and Duration  (tbd)          SLP Swallow Goals     Swallow Study Prior Functional Status       General Date of Onset: 01/07/15 Other Pertinent Information: [name] brought to [facility] ED 5/25 with AMS likely due to hypernatremia as well as aspiration PNA.  Intubated 5/25-6/7.   PmHx bipolar disorder, CVA (dense left hemiplegia), CAD.  Has had acute encephalopathy with slow resolution; noted hallucinations and periods of catatonia. Palliative following.   Type of Study: Bedside swallow evaluation Previous Swallow Assessment: none per records Diet Prior to this Study: NPO Temperature Spikes Noted: No Respiratory Status: Supplemental O2 delivered via (comment) History of Recent Intubation: Yes Length of Intubations (days): 13 days Date extubated: 01/20/15 Behavior/Cognition: Alert Oral Cavity - Dentition: Adequate natural dentition/normal for age Self-Feeding Abilities: Total assist Patient Positioning: Upright in bed Baseline Vocal Quality: Hoarse;Breathy;Low vocal intensity Volitional Cough: Weak;Congested Volitional Swallow: Able to elicit    Oral/Motor/Sensory Function Overall Oral  Motor/Sensory Function:  (symmetrical; limited range )   Ice Chips Ice chips: Impaired Presentation: Spoon Oral Phase Functional Implications: Prolonged oral transit Pharyngeal Phase Impairments: Throat Clearing - Immediate;Cough - Immediate   Thin Liquid Thin Liquid: Impaired Presentation: Spoon Oral Phase Functional Implications:  Prolonged oral transit Pharyngeal  Phase Impairments: Multiple swallows;Wet Vocal Quality;Cough - Immediate    Nectar Thick Nectar Thick Liquid: Not tested   Honey Thick Honey Thick Liquid: Not tested   Puree Puree: Impaired Presentation: Spoon Oral Phase Functional Implications: Prolonged oral transit Pharyngeal Phase Impairments: Multiple swallows   Solid   [name], [place] CCC/SLP Pager [phone]     Solid: Not tested       [name] 01/22/2015,2:25 PM

## 2015-01-22 NOTE — Progress Notes (Addendum)
Report called to nurse Whitney. Patient is undergoing a speech and swallow eval at the bedside. May require fluoro guided NG tube placement by IR if patient fails eval. IR is also at bedside.

## 2015-01-22 NOTE — Progress Notes (Addendum)
PULMONARY / CRITICAL CARE MEDICINE   Name: Kellie Velez MRN: [id number] DOB: [birth year]    ADMISSION DATE:  01/07/2015 CONSULTATION DATE:  01/22/2015 LOS 15  REFERRING MD :  EDP  CHIEF COMPLAINT:  AMS  INITIAL PRESENTATION:  [name] brought to [facility] ED 5/25 with AMS likely due to hypernatremia as well as aspiration PNA.  In ED, intubated for GCS 3 and PCCM called for admission.  Baseline has been hospitalized since December 2015, started with mental issues, then aspiration pneumonia and recurrent c.diff. 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 CT head 5/25 >>> no acute process.  Remote right MCA infarct.  Chronic microvascular ischemic changes. 5/26 - eeg>>>generalized non-specific cerebral dysfunction(encephalopathy). There was no seizure  Ct head #2>>>old subacute rt mca,- this is c/w left side weakness. In addition chroinic cerebellar and L MCA infarct similar to 2013.   SIGNIFICANT EVENTS: 5/25 - admitted with AMS, probable aspiration PNA, (cutlures ultimately grew MSSA) multiple metabolic derangements. 5/26- pupil change, to CT now, fevers, line was placed 01/12/15: Dr Kendrick Fries discussion with s-in law - >limited code blue, continue efforts to get her off the vent this week. Also C Diff positive (reportedly 3rd episode)  01/13/15: Per RN - EEG x 2 is encephalopathy. MRI not possible due to pacer.Followed 1 command with squeeze yesterday. Still not noticed to move lowers. RUE slightly stronger 2/5 compare to LUE 1/5.  Also rhythmic movement of mouth noted today.   01/14/15: Per paliative MD -- patient had abrupt decline in sept and since then hospitalzed mostly in psych unit.  01/16/15: ENT consult noted - mastoid effusions related to intubaiton. Needs serial fu in several weeks  6/7  extubated  SUBJECTIVE/OVERNIGHT/INTERVAL HX More awake  Weak cough Afebrile  denies pain  VITAL SIGNS: Temp:  [97.4 F (36.3 C)-98.3 F (36.8 C)] 97.9 F (36.6 C) (06/09 1120) Pulse Rate:  [71-130] 81 (06/09 1100) Resp:  [16-30] 24 (06/09 1100) BP: (76-115)/(48-65) 115/62 mmHg (06/09 1100) SpO2:  [85 %-100 %] 96 % (06/09 1100) Weight:  [169 lb 5 oz (76.8 kg)] 169 lb 5 oz (76.8 kg) (06/09 0414) HEMODYNAMICS:   VENTILATOR SETTINGS:   INTAKE / OUTPUT: Intake/Output      06/08 0701 - 06/09 0700 06/09 0701 - 06/10 0700   I.V. (mL/kg) 240 (3.1) 30 (0.4)   Other 250    NG/GT 2050    Total Intake(mL/kg) 2540 (33.1) 30 (0.4)   Urine (mL/kg/hr) 1595 (0.9) 75 (0.2)   Stool 725 (0.4)    Total Output 2320 75   Net +220 -45          PHYSICAL EXAMINATION:  Gen; Critically and Chronically ill appearing,  HENT: NCAT , weak cough PULM: decreased BL CV: RRR, no mgr GI: BS+, soft, nontender MSK: normal bulk, tone Derm: edema/anasarca all over, fingers dusky and scattered bruises  Neuro :  OVerall lethargic-catatonic,  follows commands int   LABS:  PULMONARY  Recent Labs Lab 01/17/15 0400  PHART 7.440  PCO2ART 31.5*  PO2ART 87.9  HCO3 21.0  TCO2 22.0  O2SAT 97.8    CBC  Recent Labs Lab 01/19/15 0525 01/20/15 0500 01/21/15 0430  HGB 8.3* 8.1* 8.0*  HCT 27.3* 27.1* 26.9*  WBC 9.9 10.9* 8.4  PLT 182 173 152    COAGULATION No results for  input(s): INR in the last 168 hours.  CARDIAC  No results for input(s): TROPONINI in the last 168 hours. No results for input(s): PROBNP in the last 168 hours.   CHEMISTRY  Recent Labs Lab 01/17/15 0313 01/18/15 0402 01/18/15 1000 01/19/15 0525 01/20/15 0500 01/21/15 0430 01/22/15 0410  NA 143 145 145 147* 143 146* 142  K 4.2 5.7* 4.9 4.5 4.2 4.4 4.2  CL 115* 114* 112* 109 107 110 109  CO2 23 25 27 27 27 28 24   GLUCOSE 106* 113* 130* 111* 114* 101* 118*  BUN 45* 44* 46* 56* 70* 67* 61*  CREATININE 0.87 0.90  0.99 1.17* 1.24* 1.17* 1.14*  CALCIUM 11.8* 12.3* 12.7* 12.7* 12.0* 11.8* 11.2*  MG 1.9 2.4  --  2.2 2.2 2.4  --   PHOS 3.6 3.7  --  4.1 5.0* 5.7*  --    Estimated Creatinine Clearance: 54.2 mL/min (by C-G formula based on Cr of 1.14).   LIVER No results for input(s): AST, ALT, ALKPHOS, BILITOT, PROT, ALBUMIN, INR in the last 168 hours.   INFECTIOUS No results for input(s): LATICACIDVEN, PROCALCITON in the last 168 hours.   ENDOCRINE CBG (last 3)   Recent Labs  01/21/15 2342 01/22/15 0344 01/22/15 0818  GLUCAP 96 85 83         IMAGING x48h  - iDg Abd Portable 1v  01/20/2015   CLINICAL DATA:  Evaluate NG tube placement  EXAM: PORTABLE ABDOMEN - 1 VIEW  COMPARISON:  01/10/2015  FINDINGS: NG huge it is in the peripyloric region with the side port in the antrum. Prior cholecystectomy. Nonobstructive bowel gas pattern.  IMPRESSION: NG tube tip in the peripyloric region.   Electronically Signed   By: Charlett Nose M.D.   On: 01/20/2015 17:15      ASSESSMENT / PLAN:  PULMONARY OETT 5/25 >>> 6/7 A: VDRF due to inability to protect airway, MSSA pneumonia, acute pulm edema -resolved  P:   Chest PT to assist with clearing secretions VAP bundle   INFECTIOUS BCx2 5/25 >ng UCx 5/25 > ng Sputum Cx 5/25 >MSSA 5/29 C diff Positive  A:   Septic shock > resolved MSSA pneumonia 01/07/15  Completed 7d Ancef  C diff, diagnosed 5/29; family says this is the third time in 6 weeks   On Recurrent C Diff Rx P:    Vanc, start date 5/25>>> 5/29 Cefepime, start date 5/25>>>5/27 Cefazolin 5/27>>>6/1 ................... Flagyl 5/29 x1 Oral vanc higher dose 500mg  q6h 5/29 > 01/15/15, 01/15/15 (lower dose of 125mg  q6h)  >> (14 days) 6/13 Fidaxomicin 200mg  bid > (14 days) 6/13  NEUROLOGIC A:   Acute metabolic encephalopathy > persistent 5/29, likely multifactorial from sepsis, hypernatremia Hx bipolar 1 disorder > family says recent use of Lithium has been harmful to mental status;  Prior to 07/2014 was living independently Hx of CVA, dense left sided hemiplegia  - 01/16/15:CT with  left mastoid effusion due to intubation.    P:   Repeat CT early July 2016 for mastoid effusion Continue to hold outpatient zyprexa, lithium dc keppra PO Appreciate neuro input  Low dose adderall started - continue for now, ? Some response Psych input, start PT   CARDIOVASCULAR A:  Septic shock > resolved Hx HTN, bradycardia s/p PPM, LVH Poor cap refill ext, currently with normal BP  P:  Monitor BP Dc CVL  RENAL  A:   AKI - due to lasix Hypernatremia resolved  P    Free water with tube  feeds for maintenance Monitor BMET and UOP Replace electrolytes as needed   GASTROINTESTINAL A:   GI prophylaxis Protein calorie malNutrition  P:  Swallow eval - If fails IR panda SUP: H2 blockade in setting of C Diff Continue TF Avoid reglan due to pscyh issues (was on it in ICU)   HEMATOLOGY  A:   VTE Prophylaxis Coagulopathy > uncertain etiology, resolved  P:  Monitor for bleeding  ENDOCRINE A:   Hyperglycemia - no hx of DM P:   SSI if glucose consistently > 180   Family updated:  Discussed with sister Kellie Velez in detail  Daily >>  Advocated for no reintubation - but Kellie Velez  will not commit to no trach  Interdisciplinary Family Meeting v Palliative Care Meeting: ongoing  GLOBAL Slowly improving but big picture guarded. Not a good candidate for LTAC, Janina Mayo Would advocate no re intubation  Ok to transfer to SDU & triad 6/10  The patient is critically ill with multiple organ systems failure and requires high complexity decision making for assessment and support, frequent evaluation and titration of therapies, application of advanced monitoring technologies and extensive interpretation of multiple databases. Critical Care Time devoted to patient care services described in this note independent of APP time is 31 minutes.    Cyril Mourning MD. Tonny Bollman.  Pulmonary &  Critical care Pager [phone] If no response call [phone]     01/22/2015 11:30 AM

## 2015-01-22 NOTE — Progress Notes (Signed)
IR to place NG tube at bedside fluoro guided this afternoon.

## 2015-01-23 ENCOUNTER — Inpatient Hospital Stay (HOSPITAL_COMMUNITY): Payer: Medicare Other

## 2015-01-23 DIAGNOSIS — F319 Bipolar disorder, unspecified: Secondary | ICD-10-CM

## 2015-01-23 DIAGNOSIS — F061 Catatonic disorder due to known physiological condition: Secondary | ICD-10-CM | POA: Diagnosis present

## 2015-01-23 LAB — BASIC METABOLIC PANEL
Anion gap: 12 (ref 5–15)
BUN: 56 mg/dL — AB (ref 6–20)
CO2: 24 mmol/L (ref 22–32)
Calcium: 11.8 mg/dL — ABNORMAL HIGH (ref 8.9–10.3)
Chloride: 111 mmol/L (ref 101–111)
Creatinine, Ser: 1.29 mg/dL — ABNORMAL HIGH (ref 0.44–1.00)
GFR calc Af Amer: 51 mL/min — ABNORMAL LOW (ref >60)
GFR calc non Af Amer: 44 mL/min — ABNORMAL LOW (ref >60)
Glucose, Bld: 85 mg/dL (ref 65–99)
Potassium: 4.5 mmol/L (ref 3.5–5.1)
Sodium: 147 mmol/L — ABNORMAL HIGH (ref 135–145)

## 2015-01-23 LAB — GLUCOSE, CAPILLARY
GLUCOSE-CAPILLARY: 57 mg/dL — AB (ref 65–99)
GLUCOSE-CAPILLARY: 132 mg/dL — AB (ref 65–99)
Glucose-Capillary: 105 mg/dL — ABNORMAL HIGH (ref 65–99)
Glucose-Capillary: 65 mg/dL (ref 65–99)
Glucose-Capillary: 95 mg/dL (ref 65–99)
Glucose-Capillary: 90 mg/dL (ref 65–99)

## 2015-01-23 MED ORDER — DEXTROSE 50 % IV SOLN
INTRAVENOUS | Status: AC
  Administered 2015-01-23: 25 mL
  Filled 2015-01-23: qty 50

## 2015-01-23 MED ORDER — DEXTROSE 50 % IV SOLN
INTRAVENOUS | Status: AC
  Administered 2015-01-23: 50 mL
  Filled 2015-01-23: qty 50

## 2015-01-23 MED ORDER — ACETAMINOPHEN 325 MG PO TABS
650.0000 mg | ORAL_TABLET | Freq: Four times a day (QID) | ORAL | Status: DC | PRN
  Administered 2015-01-23 – 2015-01-28 (×4): 650 mg via ORAL
  Filled 2015-01-23 (×4): qty 2

## 2015-01-23 MED ORDER — ACETAMINOPHEN 650 MG RE SUPP: 650.0000 mg | Freq: Four times a day (QID) | RECTAL | Status: DC | PRN

## 2015-01-23 MED ORDER — FAMOTIDINE 40 MG/5ML PO SUSR
20.0000 mg | Freq: Two times a day (BID) | ORAL | Status: DC
  Administered 2015-01-23 – 2015-01-29 (×12): 20 mg via ORAL
  Filled 2015-01-23 (×14): qty 2.5

## 2015-01-23 NOTE — Progress Notes (Signed)
MBSS complete. Full report located under chart review in imaging section. Kellie Silvey, MA CCC-SLP [phone]  

## 2015-01-23 NOTE — Progress Notes (Signed)
Tylenol given for c/o abdominal pain and bloating.

## 2015-01-23 NOTE — Clinical Social Work Psych Note (Signed)
Psych CSW was consulted regarding patient's extensive mental health hx.  Psych CSW spoke with patient's brother, Alfredo Bach [phone]).   Living Situation: Patient is from [facility], [place] [phone] (resident since May 2016).  Psych CSW confirmed residency at SNF and updated unit CSW of such. Prior to this placement, patient was at [facility] where she was a patient for approximately 5 months (December-May).  Prior to the psychiatric placement at [facility], patient was from home alone.  Brother reports, the patient was living in a "tralier that was not fit to live in".  MH hx: Brother states that patient has been on lithium since she was 58 years old.  Brother states that at the age of 29, the patient had her first "breakdown" and was placed at [facility] for approximately 2 years where she was placed on lithium.  Prior to patient's placement at [facility], the patient was admitted to [facility], where she was later discharged home and later readmitted to [facility] for psychosis and placed at [facility].    Psychosocial: Brother states that patient was independently functioning until her husband passed away.  Of report, once patient's husband passed away, her psychotic breaks seemed to be more often and much more severe.  HCPOA: Per brother, Kilian Bieschke is patient's sister-in-law (deceased husband's sister) has been appointed HCPOA by the courts.  This was initiated while a patient at [facility]. Psych CSW left LeAnn a message.  Psych CSW is awaiting a return call.  Disposition: deferred pending medical clearance   Vickii Penna, LCSW [phone]  Psychiatric & Orthopedics (5N 1-8) Clinical Social Worker

## 2015-01-23 NOTE — Progress Notes (Signed)
Medicare Important Message given? YES (If response is "NO", the following Medicare IM given date fields will be blank) Date Medicare IM given:01/23/15 Medicare IM given by: Aryanah Enslow 

## 2015-01-23 NOTE — Progress Notes (Signed)
Pt had a run of sustained v-tach for a minute at 00:23.  Pt was having CPT at time of V-tach.  CPT stopped and pt converted to paced rhythm.  Dr. Molli Knock notified.  No new orders.  Will continue to monitor pt.

## 2015-01-23 NOTE — Consult Note (Signed)
[facility] Consult   Reason for Consult:  Bipolar with catatonia and AMS Referring Physician:  Dr. Elsworth Soho Patient Identification: Kellie Velez MRN:  [id number] Principal Diagnosis: Bipolar I disorder with catatonia Diagnosis:   Patient Active Problem List   Diagnosis Date Noted  . Bipolar I disorder with catatonia [F31.9] 01/23/2015  . Colitis, Clostridium difficile [A04.7]   . Palliative care encounter [Z51.5]   . Encephalopathy [G93.40]   . Acute pulmonary edema [J81.0]   . Acute respiratory failure with hypoxia [J96.01]   . Hypernatremia [E87.0]   . MSSA (methicillin susceptible Staphylococcus aureus) pneumonia [J15.211]   . Encephalopathy acute [G93.40]   . Pressure ulcer [L89.90] 01/09/2015  . ARF (acute respiratory failure) [J96.00]   . Pneumonia [J18.9]   . Pupil asymmetry [H57.02]   . Altered mental status [R41.82] 01/07/2015  . Respiratory failure [J96.90]   . Blood poisoning [A41.9]   . Bipolar disorder, current episode mixed, severe, with psychotic features [F31.64]   . Psychoses [F29]   . Pacemaker [Z95.0] 12/31/2013  . Unspecified essential hypertension [I10] 09/28/2012  . Bipolar disorder, unspecified [F31.9] 09/28/2012  . TIA (transient ischemic attack) [G45.9] 09/28/2012  . Mild carotid artery disease [I77.9] 09/28/2012  . Osteoporosis, unspecified [M81.0] 09/28/2012  . Bradycardia [R00.1] 09/28/2012  . Heart murmur [R01.1] 09/28/2012  . Hearing loss [H91.90] 09/28/2012  . Arthritis [M19.90] 09/28/2012  . LVH (left ventricular hypertrophy) [I51.7] 09/28/2012    Total Time spent with patient: 30 minutes  Subjective:   Kellie Velez is a 59 y.o. female patient admitted with AMS.  HPI: Kellie Velez is an 59 y.o. Woman seen face-to-face for psychiatric consultation and evaluation of bipolar disorder with catatonia. Patient is a poor historian unable to give me details about her previous treatment, medications and safety concerns. Patient  appeared sitting in her bed, awake, alert, oriented and able to eat her soft food with the help of staff RN today. Patient has a history of bipolar disorder and schizoaffective disorder and was at [facility] 07/21/14-07/29/14 due to psychotic symptoms. Patient has tangential and circumstantial thinking process. Review of her medical records indicated patient presented previously with significant symptoms of mania and depression. Reportedly patient is also noncompliant with her medication management which may be reason for relapse of her symptoms.  HPI Elements:  Location:  Bipolar depression regarding disorganized thought process. Quality:  Unable to care for herself. Severity:  Needed help to feeding. Timing:  Noncompliant with medication. Duration:  One week. Context:  Multiple psychosocial stresses and poor compliant with the treatment.  Past Medical History:  Past Medical History  Diagnosis Date  . Arthritis   . Hypertension   . Nerve damage   . Osteoporosis   . Hearing loss   . Abdominal pain   . Stroke     TIA's  . Bradycardia, sinus, persistent, severe   . 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 Bipolar 1 disorder   . Heart murmur 08/13/2012    EF 60-65%,severe LVH,Mild AOV regurg, MR,trivial TR  . Mild carotid artery disease 05/05/2005,08/13/2012    left ICA 0-49% diameter reduction 2006; bilateral fibrous plaque mild 2013  . LVH (left ventricular hypertrophy)     Past Surgical History  Procedure Laterality Date  . Cholecystectomy  1980  . Appendectomy  1983  . Total knee arthroplasty  2003    left  . Permanent pacemaker insertion  01/01/2005  Medtronic EnRhythm  . Permanent pacemaker generator change  06/08/2011    Medtronic Adapta  . Tee without cardioversion  01/06/2005    no cardiac source of embolus   Family History: History reviewed. No pertinent family history. Social History:   History  Alcohol Use No     History  Drug Use No    History   Social History  . Marital Status: Married    Spouse Name: N/A  . Number of Children: N/A  . Years of Education: N/A   Social History Main Topics  . Smoking status: Current Every Day Smoker -- 0.50 packs/day    Types: Cigarettes  . Smokeless tobacco: Not on file  . Alcohol Use: No  . Drug Use: No  . Sexual Activity: Not on file   Other Topics Concern  . None   Social History Narrative   Additional Social History:                          Allergies:   Allergies  Allergen Reactions  . Floxin [Ofloxacin] Nausea And Vomiting and Rash  . Plavix [Clopidogrel Bisulfate]     "stomach pain"  . Epinephrine Other (See Comments)    "makes her feel excited"    Labs:  Results for orders placed or performed during the hospital encounter of 01/07/15 (from the past 48 hour(s))  Glucose, capillary     Status: None   Collection Time: 01/21/15 12:10 PM  Result Value Ref Range   Glucose-Capillary 98 65 - 99 mg/dL  Glucose, capillary     Status: Abnormal   Collection Time: 01/21/15  4:07 PM  Result Value Ref Range   Glucose-Capillary 100 (H) 65 - 99 mg/dL  Glucose, capillary     Status: Abnormal   Collection Time: 01/21/15  7:50 PM  Result Value Ref Range   Glucose-Capillary 100 (H) 65 - 99 mg/dL  Glucose, capillary     Status: None   Collection Time: 01/21/15 11:42 PM  Result Value Ref Range   Glucose-Capillary 96 65 - 99 mg/dL  Glucose, capillary     Status: None   Collection Time: 01/22/15  3:44 AM  Result Value Ref Range   Glucose-Capillary 85 65 - 99 mg/dL  Basic metabolic panel     Status: Abnormal   Collection Time: 01/22/15  4:10 AM  Result Value Ref Range   Sodium 142 135 - 145 mmol/L   Potassium 4.2 3.5 - 5.1 mmol/L   Chloride 109 101 - 111 mmol/L   CO2 24 22 - 32 mmol/L   Glucose, Bld 118 (H) 65 - 99 mg/dL   BUN 61 (H) 6 - 20 mg/dL   Creatinine, Ser 1.14 (H) 0.44 - 1.00 mg/dL    Calcium 11.2 (H) 8.9 - 10.3 mg/dL   GFR calc non Af Amer 51 (L) >60 mL/min   GFR calc Af Amer 59 (L) >60 mL/min    Comment: (NOTE) The eGFR has been calculated using the CKD EPI equation. This calculation has not been validated in all clinical situations. eGFR's persistently <60 mL/min signify possible Chronic Kidney Disease.    Anion gap 9 5 - 15  Glucose, capillary     Status: None   Collection Time: 01/22/15  8:18 AM  Result Value Ref Range   Glucose-Capillary 83 65 - 99 mg/dL  Glucose, capillary     Status: None   Collection Time: 01/22/15 11:18 AM  Result Value Ref Range  Glucose-Capillary 97 65 - 99 mg/dL  Glucose, capillary     Status: None   Collection Time: 01/22/15 11:10 PM  Result Value Ref Range   Glucose-Capillary 70 65 - 99 mg/dL  Basic metabolic panel     Status: Abnormal   Collection Time: 01/23/15  2:22 AM  Result Value Ref Range   Sodium 147 (H) 135 - 145 mmol/L   Potassium 4.5 3.5 - 5.1 mmol/L   Chloride 111 101 - 111 mmol/L   CO2 24 22 - 32 mmol/L   Glucose, Bld 85 65 - 99 mg/dL   BUN 56 (H) 6 - 20 mg/dL   Creatinine, Ser 1.29 (H) 0.44 - 1.00 mg/dL   Calcium 11.8 (H) 8.9 - 10.3 mg/dL   GFR calc non Af Amer 44 (L) >60 mL/min   GFR calc Af Amer 51 (L) >60 mL/min    Comment: (NOTE) The eGFR has been calculated using the CKD EPI equation. This calculation has not been validated in all clinical situations. eGFR's persistently <60 mL/min signify possible Chronic Kidney Disease.    Anion gap 12 5 - 15  Glucose, capillary     Status: None   Collection Time: 01/23/15  3:31 AM  Result Value Ref Range   Glucose-Capillary 65 65 - 99 mg/dL  Glucose, capillary     Status: Abnormal   Collection Time: 01/23/15  3:50 AM  Result Value Ref Range   Glucose-Capillary 105 (H) 65 - 99 mg/dL  Glucose, capillary     Status: Abnormal   Collection Time: 01/23/15  7:49 AM  Result Value Ref Range   Glucose-Capillary 57 (L) 65 - 99 mg/dL  Glucose, capillary     Status:  None   Collection Time: 01/23/15  8:51 AM  Result Value Ref Range   Glucose-Capillary 95 65 - 99 mg/dL   Comment 1 Notify RN     Vitals: Blood pressure 98/50, pulse 72, temperature 97.7 F (36.5 C), temperature source Oral, resp. rate 18, height _0  (1.651 m), weight 80 kg (176 lb 5.9 oz), SpO2 100 %.  Risk to Self: Is patient at risk for suicide?: No Risk to Others:   Prior Inpatient Therapy:   Prior Outpatient Therapy:    Current Facility-Administered Medications  Medication Dose Route Frequency Provider Last Rate Last Dose  . 0.9 %  sodium chloride infusion  250 mL Intravenous PRN Rahul P Desai, PA-C 10 mL/hr at 01/20/15 1300 250 mL at 01/20/15 1300  . acetaminophen (TYLENOL) suppository 650 mg  650 mg Rectal Q4H PRN Veryl Speak, MD   650 mg at 01/07/15 2035  . amphetamine-dextroamphetamine (ADDERALL) tablet 10 mg  10 mg Oral Q breakfast Kara Mead V, MD   10 mg at 01/21/15 1419  . antiseptic oral rinse (CPC / CETYLPYRIDINIUM CHLORIDE 0.05%) solution 7 mL  7 mL Mouth Rinse QID Raylene Miyamoto, MD   7 mL at 01/23/15 0400  . chlorhexidine (PERIDEX) 0.12 % solution 15 mL  15 mL Mouth Rinse BID Raylene Miyamoto, MD   15 mL at 01/22/15 2010  . famotidine (PEPCID) 40 MG/5ML suspension 20 mg  20 mg Per Tube BID Brand Males, MD   20 mg at 01/21/15 2214  . feeding supplement (VITAL AF 1.2 CAL) liquid 1,000 mL  1,000 mL Per Tube Continuous Ardeen Garland, RD   Stopped at 01/22/15 0700  . fidaxomicin (DIFICID) tablet 200 mg  200 mg Oral BID Brand Males, MD   200 mg at 01/21/15  2214  . free water 200 mL  200 mL Per Tube Q6H Rigoberto Noel, MD   Stopped at 01/22/15 0600  . heparin injection 5,000 Units  5,000 Units Subcutaneous 3 times per day Rahul Dianna Rossetti, PA-C   5,000 Units at 01/23/15 1586  . vancomycin (VANCOCIN) 50 mg/mL oral solution 125 mg  125 mg Oral 4 times per day Wynell Balloon, RPH   125 mg at 01/23/15 8257    Musculoskeletal: Strength & Muscle Tone:  decreased Gait & Station: unable to stand Patient leans: N/A  Psychiatric Specialty Exam: Physical Exam as per history and physical   ROS unable to obtain secondary to current mental status   Blood pressure 98/50, pulse 72, temperature 97.7 F (36.5 C), temperature source Oral, resp. rate 18, height _0  (1.651 m), weight 80 kg (176 lb 5.9 oz), SpO2 100 %.Body mass index is 29.35 kg/(m^2).  General Appearance: Guarded  Eye Contact::  Fair  Speech:  Blocked and Slurred  Volume:  Decreased  Mood:  Anxious and Depressed  Affect:  Constricted and Depressed  Thought Process:  Circumstantial, Disorganized, Irrelevant and Tangential  Orientation:  Full (Time, Place, and Person)  Thought Content:  Paranoid Ideation and Rumination  Suicidal Thoughts:  No  Homicidal Thoughts:  No  Memory:  Immediate;   Fair Recent;   Poor  Judgement:  Impaired  Insight:  Lacking  Psychomotor Activity:  Decreased  Concentration:  Poor  Recall:  [place] of Knowledge:Fair  Language: Fair  Akathisia:  Negative  Handed:  Right  AIMS (if indicated):     Assets:  Financial Resources/Insurance Intimacy Leisure Time Resilience Social Support  ADL's:  Impaired  Cognition: Impaired,  Mild  Sleep:      Medical Decision Making: New problem, with additional work up planned, Review of Psycho-Social Stressors (1), Review or order clinical lab tests (1), Established Problem, Worsening (2), Review of Last Therapy Session (1), Review or order medicine tests (1), Review of Medication Regimen & Side Effects (2) and Review of New Medication or Change in Dosage (2)  Treatment Plan Summary: Patient has symptoms of bipolar disorder and noncompliant with the treatment and currently unable to care for herself she needed inpatient psychiatric hospitalization. Patient does not appear to be catatonic at this time because she is able to talk unable to eat soft foods given by the staff RN. Daily contact with patient to assess  and evaluate symptoms and progress in treatment and Medication management  Plan: May resume her home medication lithium 300 mg twice daily and Zyprexa 5 mg at bedtime as soon as she was able to tolerate Recommend psychiatric Inpatient admission when medically cleared. Supportive therapy provided about ongoing stressors.  Appreciate psychiatric consultation and follow up as clinically required Please contact [phone] or [phone] if needs further assistance  Disposition: Patient meets criteria for acute psychiatric hospitalization as she was not taking her medication and continue to deteriorate her mental status.  Travarus Trudo,JANARDHAHA R. 01/23/2015 9:59 AM

## 2015-01-23 NOTE — Progress Notes (Signed)
[facility] TEAM 1 - Stepdown/ICU TEAM Progress Note  Kellie Velez XNA:[id number] DOB: [birth year] DOA: 01/07/2015 PCP: [name], MD  Admit HPI / Brief Narrative: [name] brought to [facility] ED 5/25 with AMS likely due to hypernatremia as well as aspiration PNA. In ED, intubated for GCS 3 and PCCM called for admission.  The pt has been hospitalized since December 2015; started with mental illness, then aspiration pneumonia and recurrent c.diff. During that time she has had only an intermittent ability to have conversations which are riddled with delusions and hallucinations.  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  Significant Events: 5/25 - admitted with AMS, probable aspiration PNA, (cutlures grew MSSA) multiple metabolic derangements 5/26 - pupils changed, to CT, fevers, line was placed 5/30 - Dr Kendrick Fries discussion with s-in law - > limited code blue, continue efforts to get her off the vent - C Diff positive (reportedly 3rd episode) 5/31 - EEG x 2 c/w encephalopathy. MRI not possible due to pacer. Followed 1 command with squeeze. Still not noticed to move lowers. RUE slightly stronger 2/5 compare to LUE 1/5. Also rhythmic movement of mouth noted.  6/1 -  Per Paliative MD -- patient had abrupt decline in sept and since then hospitalzed mostly in psych units 6/3 - ENT consulted - mastoid effusions related to intubaiton - serial fu in several weeks 6/7 - extubated 6/10 - [facility] assumed care   HPI/Subjective: The patient is alert and interactive today though much of her speech is garbled and incomprehensible.  She is not able to provide a reliable history.  Assessment/Plan:  VDRF due to inability to protect airway Resolved and successfully extubated  Acute metabolic encephalopathy  It's difficult to separate delirium/encephalopathy from her severe baseline psychiatric disorder - attempt to avoid sedating medications and follow her mental status  Septic  shock Sepsis physiology/shock physiology resolved  MSSA pneumonia Completed 7 days of Ancef  acute pulm edema Resolved  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 - remains on oral vancomycin  Hx of CVA, dense left sided hemiplegia  Bipolar D/O Psychiatry to see in consultation  Protein calorie malnutrition Cleared for D1 w/ nectar liquids per SLP  Hyperglycemia - no hx of DM Resolved  Code Status: NO CPR - NO CARDIOVERSION (YES to intubation, vasoactive or antiarrhythmic drugs) Family Communication: no family present at time of exam Disposition Plan: SDU  Consultants: PCCM Psychiatry  Antibiotics: Oral vancomycin Dificid  DVT prophylaxis: Subcutaneous heparin  Objective: Blood pressure 115/67, pulse 72, temperature 98.4 F (36.9 C), temperature source Oral, resp. rate 18, height 5\' 5"  (1.651 m), weight 80 kg (176 lb 5.9 oz), SpO2 98 %.  Intake/Output Summary (Last 24 hours) at 01/23/15 1625 Last data filed at 01/23/15 1300  Gross per 24 hour  Intake    120 ml  Output    500 ml  Net   -380 ml   Exam: General: No acute respiratory distress Lungs: Poor air movement bilateral bases with no focal crackles or wheezing Cardiovascular: Regular rate and rhythm without murmur gallop or rub normal S1 and S2 Abdomen: Nontender, nondistended, soft, bowel sounds positive, no rebound, no ascites, no appreciable mass Extremities: No significant cyanosis, clubbing, or edema bilateral lower extremities  Data Reviewed: Basic Metabolic Panel:  Recent Labs Lab 01/17/15 0313 01/18/15 0402  01/19/15 0525 01/20/15 0500 01/21/15 0430 01/22/15 0410 01/23/15 0222  NA 143 145  < > 147* 143 146* 142 147*  K  4.2 5.7*  < > 4.5 4.2 4.4 4.2 4.5  CL 115* 114*  < > 109 107 110 109 111  CO2 23 25  < > GLUCOSE 106* 113*  < > 111* 114* 101* 118* 85  BUN 45* 44*  < > 56* 70* 67* 61* 56*  CREATININE 0.87 0.90  < > 1.17* 1.24* 1.17* 1.14* 1.29*   CALCIUM 11.8* 12.3*  < > 12.7* 12.0* 11.8* 11.2* 11.8*  MG 1.9 2.4  --  2.2 2.2 2.4  --   --   PHOS 3.6 3.7  --  4.1 5.0* 5.7*  --   --   < > = values in this interval not displayed.  CBC:  Recent Labs Lab 01/17/15 0313 01/18/15 0402 01/19/15 0525 01/20/15 0500 01/21/15 0430  WBC 8.8 12.2* 9.9 10.9* 8.4  NEUTROABS 7.0 9.8* 7.2 8.0* 6.1  HGB 7.4* 8.2* 8.3* 8.1* 8.0*  HCT 23.6* 26.5* 27.3* 27.1* 26.9*  MCV 88.1 89.2 89.2 89.7 91.8  PLT 163 168 182 173 152    Liver Function Tests: No results for input(s): AST, ALT, ALKPHOS, BILITOT, PROT, ALBUMIN in the last 168 hours. No results for input(s): LIPASE, AMYLASE in the last 168 hours. No results for input(s): AMMONIA in the last 168 hours.  CBG:  Recent Labs Lab 01/23/15 0331 01/23/15 0350 01/23/15 0749 01/23/15 0851 01/23/15 1227  GLUCAP 65 105* 57* 95 90    Studies:   Recent x-ray studies have been reviewed in detail by the Attending Physician  Scheduled Meds:  Scheduled Meds: . amphetamine-dextroamphetamine  10 mg Oral Q breakfast  . antiseptic oral rinse  7 mL Mouth Rinse QID  . chlorhexidine  15 mL Mouth Rinse BID  . famotidine  20 mg Per Tube BID  . fidaxomicin  200 mg Oral BID  . free water  200 mL Per Tube Q6H  . heparin  5,000 Units Subcutaneous 3 times per day  . vancomycin  125 mg Oral 4 times per day    Time spent on care of this patient: 35 mins   MCCLUNG,JEFFREY T , MD   [facility] Office  [phone] Pager - Text Page per Loretha Stapler as per below:  On-Call/Text Page:      [email or web address]      password [email or web address]  If 7PM-7AM, please contact night-coverage [email or web address] Password [email or web address] 01/23/2015, 4:25 PM   LOS: 16 days

## 2015-01-23 NOTE — Progress Notes (Signed)
Chest vest stopped due to patient having a run of V-tach of over 25 beats. Nurse in room with respiratory, nurse notifying doctor.

## 2015-01-24 LAB — COMPREHENSIVE METABOLIC PANEL WITH GFR
ALT: 16 U/L (ref 14–54)
AST: 27 U/L (ref 15–41)
Albumin: 2.9 g/dL — ABNORMAL LOW (ref 3.5–5.0)
Alkaline Phosphatase: 63 U/L (ref 38–126)
Anion gap: 12 (ref 5–15)
BUN: 55 mg/dL — ABNORMAL HIGH (ref 6–20)
CO2: 23 mmol/L (ref 22–32)
Calcium: 12.7 mg/dL — ABNORMAL HIGH (ref 8.9–10.3)
Chloride: 116 mmol/L — ABNORMAL HIGH (ref 101–111)
Creatinine, Ser: 1.58 mg/dL — ABNORMAL HIGH (ref 0.44–1.00)
GFR calc Af Amer: 40 mL/min — ABNORMAL LOW
GFR calc non Af Amer: 34 mL/min — ABNORMAL LOW
Glucose, Bld: 105 mg/dL — ABNORMAL HIGH (ref 65–99)
Potassium: 5.1 mmol/L (ref 3.5–5.1)
Sodium: 151 mmol/L — ABNORMAL HIGH (ref 135–145)
Total Bilirubin: 0.7 mg/dL (ref 0.3–1.2)
Total Protein: 6.9 g/dL (ref 6.5–8.1)

## 2015-01-24 LAB — GLUCOSE, CAPILLARY
GLUCOSE-CAPILLARY: 99 mg/dL (ref 65–99)
GLUCOSE-CAPILLARY: 140 mg/dL — AB (ref 65–99)
Glucose-Capillary: 100 mg/dL — ABNORMAL HIGH (ref 65–99)
Glucose-Capillary: 102 mg/dL — ABNORMAL HIGH (ref 65–99)
Glucose-Capillary: 129 mg/dL — ABNORMAL HIGH (ref 65–99)
Glucose-Capillary: 141 mg/dL — ABNORMAL HIGH (ref 65–99)

## 2015-01-24 LAB — CBC
HCT: 33.3 % — ABNORMAL LOW (ref 36.0–46.0)
Hemoglobin: 10 g/dL — ABNORMAL LOW (ref 12.0–15.0)
MCH: 27.3 pg (ref 26.0–34.0)
MCHC: 30 g/dL (ref 30.0–36.0)
MCV: 91 fL (ref 78.0–100.0)
PLATELETS: 178 10*3/uL (ref 150–400)
RBC: 3.66 MIL/uL — ABNORMAL LOW (ref 3.87–5.11)
RDW: 19.8 % — ABNORMAL HIGH (ref 11.5–15.5)
WBC: 7.8 10*3/uL (ref 4.0–10.5)

## 2015-01-24 MED ORDER — RESOURCE THICKENUP CLEAR PO POWD
ORAL | Status: DC | PRN
  Filled 2015-01-24: qty 125

## 2015-01-24 MED ORDER — STARCH (THICKENING) PO POWD
ORAL | Status: DC | PRN
  Filled 2015-01-24: qty 227

## 2015-01-24 MED ORDER — SODIUM CHLORIDE 0.45 % IV BOLUS
500.0000 mL | Freq: Once | INTRAVENOUS | Status: AC
  Administered 2015-01-24: 500 mL via INTRAVENOUS

## 2015-01-24 MED ORDER — SODIUM CHLORIDE 0.45 % IV SOLN
INTRAVENOUS | Status: DC
  Administered 2015-01-24: 75 mL/h via INTRAVENOUS
  Administered 2015-01-25: 10:00:00 via INTRAVENOUS

## 2015-01-24 NOTE — Progress Notes (Signed)
Daily Progress Note   Patient Name: Kellie Velez       Date: 01/24/2015 DOB: October 13, 1955  Age: 59 y.o. MRN#: [id number] Attending Physician: Lonia Blood, MD Primary Care Physician: [name], MD Admit Date: 01/07/2015  Reason for Consultation/Follow-up: Establishing goals of care  Subjective: Alert today. Tells me nothing is wrong. When I ask her to tell me where she is, she tells me "none of your business". When i ask if she needs anything she tells me "[place]". When i have her elaborate on this, she tells me "[facility]".      Length of Stay: 17 days  Current Medications: Scheduled Meds:  . amphetamine-dextroamphetamine  10 mg Oral Q breakfast  . antiseptic oral rinse  7 mL Mouth Rinse QID  . chlorhexidine  15 mL Mouth Rinse BID  . famotidine  20 mg Oral BID  . fidaxomicin  200 mg Oral BID  . heparin  5,000 Units Subcutaneous 3 times per day  . vancomycin  125 mg Oral 4 times per day    Continuous Infusions:    PRN Meds: sodium chloride, acetaminophen, acetaminophen    Vital Signs: BP 145/70 mmHg  Pulse 98  Temp(Src) 98.4 F (36.9 C) (Oral)  Resp 16  Ht 5\' 5"  (1.651 m)  Wt 83.6 kg (184 lb 4.9 oz)  BMI 30.67 kg/m2  SpO2 96% SpO2: SpO2: 96 % O2 Device: O2 Device: Not Delivered O2 Flow Rate: O2 Flow Rate (L/min): 2 L/min  Intake/output summary:  Intake/Output Summary (Last 24 hours) at 01/24/15 1121 Last data filed at 01/24/15 0900  Gross per 24 hour  Intake    480 ml  Output    550 ml  Net    -70 ml   LBM:   Baseline Weight: Weight: 103.42 kg (228 lb) Most recent weight: Weight: 83.6 kg (184 lb 4.9 oz)  Physical Exam: GEN: alert, confused HEENT: [place], mmm AQT:MAUQ Ext: warm              Additional Data Reviewed: Recent Labs     01/23/15  0222  01/24/15  0300  WBC   --   7.8  HGB   --   10.0*  PLT   --   178  NA  147*  151*  BUN  56*  55*  CREATININE  1.29*  1.58*     Problem List:  Patient Active Problem List   Diagnosis Date Noted  . Bipolar I disorder with catatonia 01/23/2015  . Colitis, Clostridium difficile   . Palliative care encounter   . Encephalopathy   . Acute pulmonary edema   . Acute respiratory failure with hypoxia   . Hypernatremia   . MSSA (methicillin susceptible Staphylococcus aureus) pneumonia   . Encephalopathy acute   . Pressure ulcer 01/09/2015  . ARF (acute respiratory failure)   . Pneumonia   . Pupil asymmetry   . Altered mental status 01/07/2015  . Respiratory failure   . Blood poisoning   . Bipolar disorder, current episode mixed, severe, with psychotic features   . Psychoses   . 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 Unspecified essential hypertension 09/28/2012  . Bipolar disorder, unspecified 09/28/2012  . TIA (transient ischemic attack) 09/28/2012  . Mild carotid artery disease 09/28/2012  . Osteoporosis, unspecified 09/28/2012  . Bradycardia 09/28/2012  . Heart murmur 09/28/2012  . Hearing loss 09/28/2012  . Arthritis 09/28/2012  . LVH (left ventricular hypertrophy) 09/28/2012     Palliative Care Assessment & Plan  59 yo female with PMHx of Bipolar, CVA, CAD who was admitted from [facility] on 5/21 with altered mental status. Prolonged admission for AMS and resp failure.   Code Status:  Limited code  Goals of Care:  See pervious notes. Spoke with guardian Soledad Gerlach today and updated. Her goal is to get her back to SNF and continue process of attempting control of her Bipolar.  She would like to speak with Psych when they see Corrie Dandy again.    Desire for further Chaplaincy support:no  3. Symptom Management:  Acute Encephalopathy- slowly resolving. High risk for setbacks  4. Prognosis: poor, high risk for setbacks even in next days to weeks  5. Discharge Planning: TBD   Total Time: 15 minutes Greater than 50% of this time was spent counseling and coordinating care related to the above assessment and plan.   Orvis Brill, DO  01/24/2015,  11:21 AM  Please contact Palliative Medicine Team phone at [phone] for questions and concerns.

## 2015-01-24 NOTE — Progress Notes (Signed)
Report received from Garrett, RN from [facility] . Awaiting pt's arrival.

## 2015-01-24 NOTE — Progress Notes (Signed)
[facility] TEAM 1 - Stepdown/ICU TEAM Progress Note  Kellie Velez WUJ:[id number] DOB: November 06, 1955 DOA: 01/07/2015 PCP: [name], MD  Admit HPI / Brief Narrative: [name] brought to [facility] ED 5/25 with AMS due to hypernatremia as well as aspiration PNA. In ED, intubated for GCS 3 and PCCM called for admission.  The pt has been hospitalized since December 2015; started with mental illness, then aspiration pneumonia and recurrent c.diff. During that time she has had only an intermittent ability to have conversations which are riddled with delusions and hallucinations.  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  Significant Events: 5/25 - admitted with AMS, probable aspiration PNA, (cutlures grew MSSA) multiple metabolic derangements 5/26 - pupils changed, to CT, fevers, line was placed 5/30 - Dr Kendrick Fries discussion with s-in law - > limited code blue, continue efforts to get her off the vent - C Diff positive (reportedly 3rd episode) 5/31 - EEG x 2 c/w encephalopathy. MRI not possible due to pacer. Followed 1 command with squeeze. Still not noticed to move lowers. RUE slightly stronger 2/5 compare to LUE 1/5. Also rhythmic movement of mouth noted.  6/1 -  Per Paliative MD -- patient had abrupt decline in sept and since then hospitalzed mostly in psych units 6/3 - ENT consulted - mastoid effusions related to intubaiton - serial fu in several weeks 6/7 - extubated 6/10 - [facility] assumed care   HPI/Subjective: The patient is alert and interactive today.  She presently denies any complaints whatsoever to include chest pain shortness breath fevers chills nausea vomiting or abdominal pain.  She is able to tell me she is in the hospital in [facility] and that it is 2016.  Assessment/Plan:  VDRF due to inability to protect airway Resolved and successfully extubated  Acute metabolic encephalopathy  It's difficult to separate delirium/encephalopathy from her  severe baseline psychiatric disorder - attempt to avoid sedating medications and follow her mental status - it appears at this time that her mental status has greatly improved and I suspect she is approaching her baseline  Hypernatremia Appears to be due to poor intake likely related to requirement for thickened liquids - resume low-dose IV fluid and follow trend  Mild acute renal insufficiency Appears to be due prerenal azotemia which is due to poor intake likely related to requirement for thickened liquids - resume low-dose IV fluid and follow trend  Septic shock Sepsis physiology/shock physiology resolved - patient is presently hemodynamically stable  MSSA pneumonia Completed 7 days of Ancef  acute pulm edema Resolved  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 - remains on oral vancomycin - continue treatment and isolation  Hx of CVA, dense left sided hemiplegia  Bipolar D/O Psychiatry to see in consultation per PCCM consultation  Protein calorie malnutrition Cleared for D1 w/ nectar liquids per SLP - follow intake  Hyperglycemia - no hx of DM Resolved - discontinue CBG checks  Code Status: NO CPR - NO CARDIOVERSION (YES to intubation, vasoactive or antiarrhythmic drugs) Family Communication: no family present at time of exam Disposition Plan: stable for transfer to medical bed - begin PT/OT - awaiting psychiatric recommendations - ultimate disposition will be transfer to SNF  Consultants: PCCM Psychiatry Palliative care  Antibiotics: Oral vancomycin Dificid  DVT prophylaxis: Subcutaneous heparin  Objective: Blood pressure 106/93, pulse 112, temperature 98.4 F (36.9 C), temperature source Oral, resp. rate 14, height  (1.651 m), weight 83.6 kg (184 lb 4.9  oz), SpO2 97 %.  Intake/Output Summary (Last 24 hours) at 01/24/15 1537 Last data filed at 01/24/15 1346  Gross per 24 hour  Intake    540 ml  Output     50 ml  Net    490 ml    Exam: General: No acute respiratory distress - alert, calm, and conversant Lungs: Poor air movement bilateral bases with no focal crackles or wheezing Cardiovascular: Regular rate and rhythm without murmur gallop or rub Abdomen: Nontender, nondistended, soft, bowel sounds positive, no rebound, no ascites, no appreciable mass Extremities: No significant cyanosis, clubbing, edema bilateral lower extremities  Data Reviewed: Basic Metabolic Panel:  Recent Labs Lab 01/18/15 0402  01/19/15 0525 01/20/15 0500 01/21/15 0430 01/22/15 0410 01/23/15 0222 01/24/15 0300  NA 145  < > 147* 143 146* 142 147* 151*  K 5.7*  < > 4.5 4.2 4.4 4.2 4.5 5.1  CL 114*  < > 109 107 110 109 111 116*  CO2 25  < > 27 27 28 24 24 23   GLUCOSE 113*  < > 111* 114* 101* 118* 85 105*  BUN 44*  < > 56* 70* 67* 61* 56* 55*  CREATININE 0.90  < > 1.17* 1.24* 1.17* 1.14* 1.29* 1.58*  CALCIUM 12.3*  < > 12.7* 12.0* 11.8* 11.2* 11.8* 12.7*  MG 2.4  --  2.2 2.2 2.4  --   --   --   PHOS 3.7  --  4.1 5.0* 5.7*  --   --   --   < > = values in this interval not displayed.  CBC:  Recent Labs Lab 01/18/15 0402 01/19/15 0525 01/20/15 0500 01/21/15 0430 01/24/15 0300  WBC 12.2* 9.9 10.9* 8.4 7.8  NEUTROABS 9.8* 7.2 8.0* 6.1  --   HGB 8.2* 8.3* 8.1* 8.0* 10.0*  HCT 26.5* 27.3* 27.1* 26.9* 33.3*  MCV 89.2 89.2 89.7 91.8 91.0  PLT 168 182 173 152 178    Liver Function Tests:  Recent Labs Lab 01/24/15 0300  AST 27  ALT 16  ALKPHOS 63  BILITOT 0.7  PROT 6.9  ALBUMIN 2.9*   CBG:  Recent Labs Lab 01/24/15 0054 01/24/15 0442 01/24/15 0842 01/24/15 1129 01/24/15 1504  GLUCAP 100* 99 102* 141* 140*    Studies:   Recent x-ray studies have been reviewed in detail by the Attending Physician  Scheduled Meds:  Scheduled Meds: . amphetamine-dextroamphetamine  10 mg Oral Q breakfast  . antiseptic oral rinse  7 mL Mouth Rinse QID  . chlorhexidine  15 mL Mouth Rinse BID  . famotidine  20 mg Oral BID   . fidaxomicin  200 mg Oral BID  . heparin  5,000 Units Subcutaneous 3 times per day  . vancomycin  125 mg Oral 4 times per day    Time spent on care of this patient: 35 mins   Lidya Mccalister T , MD   [facility] Office  [phone] Pager - Text Page per Loretha Stapler as per below:  On-Call/Text Page:      [email or web address]      password [email or web address]  If 7PM-7AM, please contact night-coverage [email or web address] Password [email or web address] 01/24/2015, 3:37 PM   LOS: 17 days

## 2015-01-24 NOTE — Progress Notes (Signed)
Attempted to call report to RN on 5W, nurse to call back for report.

## 2015-01-25 DIAGNOSIS — R1310 Dysphagia, unspecified: Secondary | ICD-10-CM

## 2015-01-25 LAB — COMPREHENSIVE METABOLIC PANEL
ALT: 13 U/L — AB (ref 14–54)
AST: 15 U/L (ref 15–41)
Albumin: 2.9 g/dL — ABNORMAL LOW (ref 3.5–5.0)
Alkaline Phosphatase: 58 U/L (ref 38–126)
Anion gap: 14 (ref 5–15)
BILIRUBIN TOTAL: 0.4 mg/dL (ref 0.3–1.2)
BUN: 53 mg/dL — ABNORMAL HIGH (ref 6–20)
CALCIUM: 12.8 mg/dL — AB (ref 8.9–10.3)
CO2: 22 mmol/L (ref 22–32)
CREATININE: 1.79 mg/dL — AB (ref 0.44–1.00)
Chloride: 117 mmol/L — ABNORMAL HIGH (ref 101–111)
GFR calc Af Amer: 34 mL/min — ABNORMAL LOW (ref >60)
GFR calc non Af Amer: 29 mL/min — ABNORMAL LOW (ref >60)
Glucose, Bld: 96 mg/dL (ref 65–99)
Potassium: 4.7 mmol/L (ref 3.5–5.1)
Sodium: 153 mmol/L — ABNORMAL HIGH (ref 135–145)
TOTAL PROTEIN: 6.6 g/dL (ref 6.5–8.1)

## 2015-01-25 LAB — GLUCOSE, CAPILLARY: GLUCOSE-CAPILLARY: 81 mg/dL (ref 65–99)

## 2015-01-25 MED ORDER — ASPIRIN 81 MG PO CHEW
81.0000 mg | CHEWABLE_TABLET | Freq: Every day | ORAL | Status: DC
  Administered 2015-01-25 – 2015-01-29 (×5): 81 mg via ORAL
  Filled 2015-01-25 (×5): qty 1

## 2015-01-25 MED ORDER — ALBUMIN HUMAN 5 % IV SOLN
12.5000 g | Freq: Once | INTRAVENOUS | Status: AC
  Administered 2015-01-25: 12.5 g via INTRAVENOUS
  Filled 2015-01-25: qty 250

## 2015-01-25 MED ORDER — SACCHAROMYCES BOULARDII 250 MG PO CAPS
250.0000 mg | ORAL_CAPSULE | Freq: Two times a day (BID) | ORAL | Status: DC
  Administered 2015-01-25 – 2015-01-29 (×9): 250 mg via ORAL
  Filled 2015-01-25 (×10): qty 1

## 2015-01-25 MED ORDER — DEXTROSE 5 % IV SOLN
INTRAVENOUS | Status: DC
  Administered 2015-01-25 – 2015-01-27 (×4): via INTRAVENOUS

## 2015-01-25 NOTE — Progress Notes (Signed)
PROGRESS NOTE  Kellie Velez ZOX:[id number] DOB: 11/09/1955 DOA: 01/07/2015 PCP: [name], MD  Significant Events: 5/25 - admitted with AMS, probable aspiration PNA, (cutlures grew MSSA) multiple metabolic derangements 5/26 - pupils changed, to CT, fevers, line was placed 5/30 - Dr Kendrick Fries discussion with s-in law - > limited code blue, continue efforts to get her off the vent - C Diff positive (reportedly 3rd episode) 5/31 - EEG x 2 c/w encephalopathy. MRI not possible due to pacer. Followed 1 command with squeeze. Still not noticed to move lowers. RUE slightly stronger 2/5 compare to LUE 1/5. Also rhythmic movement of mouth noted.  6/1 - Per Paliative MD -- patient had abrupt decline in sept and since then hospitalzed mostly in psych units 6/3 - ENT consulted - mastoid effusions related to intubaiton - serial fu in several weeks 6/7 - extubated 6/10 - TR[facility] assumed care   HPI/Subjective: The patient is alert and interactive today. She presently denies any complaints  to include chest pain shortness breath fevers chills nausea vomiting or abdominal pain.She asks me to sit down and have some food with her.  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 -difficult to separate delirium/encephalopathy from her severe baseline psychiatric disorder - attempt to avoid sedating medications and follow her mental status - it appears at this time that her mental status has greatly improved and I suspect she is approaching her baseline -Patient was catatonic when she was in the ICU--improved after starting Adderall -Continue to hold Zyprexa and lithium- Family states that patient had adverse reactions to  lithium  Hypernatremia -Likely due to poor intake likely related to requirement for thickened liquids but cannot r/o a degree of nephrogenic Diabetes Insipidus from previous lithium use -switch to D5W   AKI -Baseline creatinine 0.9-1.2  -Likely due to volume depletion from poor oral intake (and requirement for thickened liquids) as well as increased insensible water loss from the patient's sepsis  -continue IVF   Hypercalcemia -The patient has been hypercalcemic throughout the hospitalization -Corrected calcium 14.5 on the day of admission -01/07/2015 PTH 61 -TSH--0.415 -Likely a component of hypercalcemia although immobility, but cannot rule out primary hyperparathyroidism -Check 25 vitamin D, TSH, SPEP -urine calcium/creatinine ratio  Septic shock -Secondary to aspiration pneumonia -Sepsis physiology/shock physiology resolved - patient is presently hemodynamically stable  MSSA pneumonia Completed 7 days of Ancef   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 - remains on oral vancomycin and fidaxomicin - continue treatment and isolation -01/25/2015--today is the last day for fidaxomicin and oral vancomycin -Start florastor  Hx of CVA(R-MCA), dense left sided hemiplegia -PT/OT -likely return to SNF -Restart aspirin 81 mg daily  Bipolar D/O with catatonia -Psychiatry to see in consultation per PCCM consultation -Family's reports that she did not do well with lithium  acute pulm edema Resolved  Protein calorie malnutrition Cleared for D1 w/ nectar liquids per SLP - follow intake  Hyperglycemia - no hx of DM Resolved - discontinue CBG checks Check hemoglobin A1c, but suspect this is stress-induced  Code Status: NO CPR - NO CARDIOVERSION (YES to intubation, vasoactive or antiarrhythmic drugs) Family Communication: no family present at time of exam Disposition Plan: stable for transfer to medical bed - begin PT/OT - awaiting  psychiatric  recommendations - ultimate disposition will be transfer to SNF  Consultants: PCCM Psychiatry Palliative care      Procedures/Studies: Ct Head Wo Contrast  01/14/2015   CLINICAL DATA:  59 year old female with continued encephalopathy. Intubated. Initial encounter.  EXAM: CT HEAD WITHOUT CONTRAST  TECHNIQUE: Contiguous axial images were obtained from the base of the skull through the vertex without intravenous contrast.  COMPARISON:  01/08/2015 and earlier.  FINDINGS: Partially visible endotracheal and oral enteric tubes. Stable left maxillary sinus mucous retention cyst. Left tympanic and mastoid opacification appears increased. Small volume layering fluid in the pharynx.  No acute osseous abnormality identified. No acute orbit or scalp soft tissue findings.  Calcified atherosclerosis at the skull base. Compared to 01/07/2015, abnormal right MCA territory hypodensity has not changed. This is a new finding compared to 2013. No associated mass effect is evident. No associated hemorrhage. No ventriculomegaly. Patchy hypodensity in the left MCA white matter also appears stable, and was partially present in 2013. Chronic left cerebellar infarct is unchanged since 2013. No suspicious intracranial vascular hyperdensity. No acute intracranial hemorrhage identified.  IMPRESSION: 1. Subacute versus chronic right greater than left MCA territory ischemia appears unchanged since 01/07/2015. Some of the left MCA findings were present in 2013. No associated hemorrhage or mass effect. 2. Chronic left cerebellar infarct is stable since 2013. 3. Left mastoid and tympanic effusion likely secondary to intubation.   Electronically Signed   By: Odessa Fleming M.D.   On: 01/14/2015 14:56   Ct Head Wo Contrast  01/08/2015   CLINICAL DATA:  Unresponsive, pupil asymmetry, follow-up  EXAM: CT HEAD WITHOUT CONTRAST  TECHNIQUE: Contiguous axial images were obtained from the base of the skull through the vertex without intravenous  contrast.  COMPARISON:  01/07/2015  FINDINGS: No evidence of parenchymal hemorrhage or extra-axial fluid collection. No mass lesion, mass effect, or midline shift.  No CT evidence of acute infarction.  Focal hypodensity within the right frontoparietal region (series 2/ image 21), favored to reflect encephalomalacic changes related to prior right MCA distribution infarct, likely chronic although possibly subacute. The appearance on the current CT is slightly unusual with apparent preservation of the overlying cortical rim, although this apparent difference may be simply technical/artifactual.  Subcortical white matter and periventricular small vessel ischemic changes. Mild cranial atherosclerosis.  Mild cortical atrophy.  No ventriculomegaly.  Partial opacification of the left maxillary sinus. Partial opacification of the bilateral mastoid air cells, left greater than right.  No evidence of calvarial fracture.  IMPRESSION: Suspected prior right MCA distribution infarct, favored to be chronic, less likely subacute.  Otherwise, no evidence of acute intracranial abnormality.   Electronically Signed   By: Charline Bills M.D.   On: 01/08/2015 12:16   Ct Head Wo Contrast  01/07/2015   CLINICAL DATA:  Altered mental status.  Code sepsis.  EXAM: CT HEAD WITHOUT CONTRAST  TECHNIQUE: Contiguous axial images were obtained from the base of the skull through the vertex without intravenous contrast.  COMPARISON:  Head CT scan 07/20/2012.  FINDINGS: Since prior examination, the patient has suffered a right MCA infarct which appears remote. There is chronic microvascular ischemic change. No evidence of acute abnormality including infarct, hemorrhage, mass lesion, mass effect, midline shift or abnormal extra-axial fluid collections identified. No hydrocephalus or pneumocephalus. Mucous retention cyst or polyp left maxillary sinus is noted.  IMPRESSION: No acute abnormality.  Remote right MCA infarct.  Chronic microvascular  ischemic change.   Electronically Signed   By: Maisie Fus  Dalessio M.D.   On: 01/07/2015 12:29   Dg Chest Port 1 View  01/19/2015   CLINICAL DATA:  Shortness of breath.  EXAM: PORTABLE CHEST - 1 VIEW  COMPARISON:  01/18/2015.  FINDINGS: Patient rotated to the left. Endotracheal tube tip 1.8 cm above the carina. Right IJ line and NG tube in stable position. Mediastinum hilar structures are normal. Heart size stable. Cardiac pacer in stable position. Persistent bilateral lower lobe infiltrates and atelectatic changes are present. No interim change. No pleural effusion or pneumothorax.  IMPRESSION: 1. Lines and tubes in stable position. 2. Persistent bilateral lower lobe infiltrates and atelectasis. No interim change.   Electronically Signed   By: Maisie Fus  Register   On: 01/19/2015 07:09   Dg Chest Port 1 View  01/18/2015   CLINICAL DATA:  Acute respiratory failure. On ventilator.  Hypoxia  EXAM: PORTABLE CHEST - 1 VIEW  COMPARISON:  01/17/2015  FINDINGS: Patient is rotated to the left on today's exam. Support lines and tubes in appropriate position. Bilateral lower lobe airspace disease is again seen, left side greater than right, without significant change. No definite pneumothorax visualized.  IMPRESSION: Left greater than right lower lobe airspace disease, without significant change.   Electronically Signed   By: Myles Rosenthal M.D.   On: 01/18/2015 09:09   Dg Chest Port 1 View  01/17/2015   CLINICAL DATA:  Endotracheal tube present.  EXAM: PORTABLE CHEST - 1 VIEW  COMPARISON:  One-view chest 01/16/2015  FINDINGS: The heart size is normal. A left pleural effusion and basilar airspace disease are again noted. The endotracheal tube terminates 3 cm above the carina. Pacing wires are stable. The NG tube courses off the inferior border the film. A right IJ line terminates at cavoatrial junction. Mild edema is present.  IMPRESSION: 1. No significant change in mild edema, left pleural effusion and basilar airspace disease  likely atelectasis. 2. Stable and satisfactory positioning of the support apparatus.   Electronically Signed   By: Marin Roberts M.D.   On: 01/17/2015 07:33   Dg Chest Port 1 View  01/16/2015   CLINICAL DATA:  Hypoxia  EXAM: PORTABLE CHEST - 1 VIEW  COMPARISON:  Jan 12, 2015  FINDINGS: Endotracheal tube tip is 4.7 cm above the carina. Central catheter tip is essentially at the cavoatrial junction. Nasogastric tube tip and side port are below the diaphragm. No pneumothorax. There is persistent airspace consolidation on the left with minimal left effusion. Right lung is clear. Heart size and pulmonary vascularity are normal. No adenopathy. Pacemaker leads are attached to the right atrium and right ventricle.  IMPRESSION: Tube and catheter positions as described without pneumothorax. Persistent left lower lobe consolidation with small left effusion. No new opacity.   Electronically Signed   By: Bretta Bang III M.D.   On: 01/16/2015 07:14   Dg Chest Port 1 View  01/12/2015   CLINICAL DATA:  Hypoxia with acute respiratory failure  EXAM: PORTABLE CHEST - 1 VIEW  COMPARISON:  01/11/2015  FINDINGS: Cardiac shadow is stable. A pacing device, endotracheal tube, nasogastric catheter and right central venous line are again seen and stable. Bibasilar densities are seen slightly increased on the left when compared with the with the previous day.  IMPRESSION: Tubes and lines as described.  Bibasilar changes with slight increase on the left when compared with the previous day.   Electronically Signed   By: Alcide Clever M.D.   On: 01/12/2015 07:35   Dg Chest Port 1  View  01/11/2015   CLINICAL DATA:  Acute respiratory failure  EXAM: PORTABLE CHEST - 1 VIEW  COMPARISON:  01/10/2015  FINDINGS: Bibasilar airspace opacities are stable. There are no new lung abnormalities.  No pneumothorax.  Probable small effusions.  Endotracheal tube tip is not well defined on this exam, but appears to project approximate 5 mm above  the carina.  Orogastric tube and right internal jugular central venous line are stable and well positioned.  IMPRESSION: 1. No change from the previous day's study. 2. Persistent basilar airspace opacity which may reflect pneumonia, atelectasis or a combination. No evidence of pulmonary edema. 3. Support apparatus stable position.   Electronically Signed   By: Amie Portland M.D.   On: 01/11/2015 07:13   Dg Chest Port 1 View  01/10/2015   CLINICAL DATA:  Acute respiratory failure.  EXAM: PORTABLE CHEST - 1 VIEW  COMPARISON:  01/09/2015  FINDINGS: Nasogastric tube tip is in the stomach. The right IJ catheter tip is in the projection of the cavoatrial junction. Left chest wall ICD is noted with leads in the right atrial appendage and right ventricle. No significant change in bilateral lower lobe airspace opacities.  IMPRESSION: 1. No change in bilateral lower lobe airspace opacities. 2. Stable support apparatus.   Electronically Signed   By: Signa Kell M.D.   On: 01/10/2015 09:20   Dg Chest Port 1 View  01/09/2015   CLINICAL DATA:  Pneumonia.  EXAM: PORTABLE CHEST - 1 VIEW  COMPARISON:  01/08/2015.  FINDINGS: Endotracheal tube, NG tube, right IJ line in stable position. Mediastinum hilar structures are stable. Cardiac pacer lead tips in right atrium right ventricle. Heart size stable. The persistent unchanged prominent bibasilar pulmonary infiltrates consistent with pneumonia. No pleural effusion or pneumothorax.  IMPRESSION: 1.  Lines and tubes in stable position.  2. Persistent prominent bilateral lower lobe pulmonary infiltrates consistent with pneumonia.   Electronically Signed   By: Maisie Fus  Register   On: 01/09/2015 07:29   Dg Chest Port 1 View  01/08/2015   CLINICAL DATA:  Respiratory failure, sepsis, current tobacco use  EXAM: PORTABLE CHEST - 1 VIEW  COMPARISON:  Portable chest x-ray of Jan 07, 2015  FINDINGS: The lungs are adequately inflated. There is persistent interstitial and alveolar opacity  in the right lower lobe. Persistent increased retrocardiac density is demonstrated but slight improved aeration present here. The cardiac silhouette is normal in size. The pulmonary vascularity is not engorged. The endotracheal tube tip lies 2.8 cm above the carina. The esophagogastric tube tip projects below the inferior margin of the image. The right internal jugular venous catheter tip projects over the midportion of the SVC. The permanent pacemaker is in reasonable position. The bony thorax exhibits no acute abnormalities.  IMPRESSION: Persistent bibasilar atelectasis and/or pneumonia. Slight interval improvement in the left retrocardiac region has occurred. The support tubes and lines are in reasonable position.   Electronically Signed   By: Aniket Paye  [place] M.D.   On: 01/08/2015 07:54   Dg Chest Port 1 View  01/07/2015   CLINICAL DATA:  Central line placement.  EXAM: PORTABLE CHEST - 1 VIEW  COMPARISON:  Same day.  FINDINGS: Stable cardiomediastinal silhouette. Endotracheal and nasogastric tubes are in grossly good position. Left-sided pacemaker is unchanged in position. Interval placement of right internal jugular catheter line is noted with distal tip in expected position of the cavoatrial junction. No pneumothorax is noted. Left lung is clear. Right basilar opacity is again noted concerning for  pneumonia or atelectasis. No significant pleural effusion is noted.  IMPRESSION: Interval placement of right internal jugular catheter is noted with distal tip in expected position of cavoatrial junction. No pneumothorax is noted. Stable right basilar opacity is noted concerning for pneumonia or atelectasis.   Electronically Signed   By: Lupita Raider, M.D.   On: 01/07/2015 19:01   Dg Chest Port 1 View  01/07/2015   CLINICAL DATA:  Respiratory failure endotracheal tube placement  EXAM: PORTABLE CHEST - 1 VIEW  COMPARISON:  08/04/2014  FINDINGS: Cardiomegaly again noted. Dual lead cardiac pacemaker is unchanged  in position. Hazy right infrahilar atelectasis or infiltrate. There is NG tube in place. Endotracheal tube with tip at the level of carina towards right main bronchus. Endotracheal tube should be retracted about 1.5 cm. There is no pneumothorax.  IMPRESSION: Dual lead cardiac pacemaker is unchanged in position. Hazy right infrahilar atelectasis or infiltrate. There is NG tube in place. Endotracheal tube with tip at the level of carina towards right main bronchus. Endotracheal tube should be retracted about 1.5 cm. There is no pneumothorax.  These results were called by telephone at the time of interpretation on 01/07/2015 at 11:53 am to Dr. Geoffery Lyons , who verbally acknowledged these results.   Electronically Signed   By: Natasha Mead M.D.   On: 01/07/2015 11:53   Dg Abd Portable 1v  01/20/2015   CLINICAL DATA:  Evaluate NG tube placement  EXAM: PORTABLE ABDOMEN - 1 VIEW  COMPARISON:  01/10/2015  FINDINGS: NG huge it is in the peripyloric region with the side port in the antrum. Prior cholecystectomy. Nonobstructive bowel gas pattern.  IMPRESSION: NG tube tip in the peripyloric region.   Electronically Signed   By: Charlett Nose M.D.   On: 01/20/2015 17:15   Dg Abd Portable 1v  01/10/2015   CLINICAL DATA:  Orogastric tube placement.  EXAM: PORTABLE ABDOMEN - 1 VIEW  COMPARISON:  Chest x-ray today.  FINDINGS: Enteric tube is present which has tip right of midline in the upper abdomen likely over the distal stomach/ gastroduodenal junction. Bowel gas pattern is nonobstructive. IVC filter is present with tip just left of midline at the L2 level as patient is slightly rotated to the left. There are mild degenerative changes of the spine. There are several pelvic phleboliths. A small caliber rectal catheter is present.  IMPRESSION: Nonobstructive bowel gas pattern.  Enteric tube with tip right of midline over the upper abdomen likely over the distal stomach or gastroduodenal junction.   Electronically Signed   By:  Elberta Fortis M.D.   On: 01/10/2015 12:12   Dg Swallowing Func-speech Pathology  01/23/2015    Objective Swallowing Evaluation:    Patient Details  Name: Kellie Velez MRN: [id number] Date of Birth: February 16, 1956  Today's Date: 01/23/2015 Time: SLP Start Time (ACUTE ONLY): 1010-SLP Stop Time (ACUTE ONLY): 1030 SLP Time Calculation (min) (ACUTE ONLY): 20 min  Past Medical History:  Past Medical History  Diagnosis Date  . Arthritis   . Hypertension   . Nerve damage   . Osteoporosis   . Hearing loss   . Abdominal pain   . Stroke     TIA's  . Bradycardia, sinus, persistent, severe   . 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 Bipolar 1 disorder   . Heart murmur 08/13/2012    EF 60-65%,severe LVH,Mild AOV regurg, MR,trivial TR  . Mild carotid artery disease 05/05/2005,08/13/2012  left ICA 0-49% diameter reduction 2006; bilateral fibrous plaque mild  2013  . LVH (left ventricular hypertrophy)    Past Surgical History:  Past Surgical History  Procedure Laterality Date  . Cholecystectomy  1980  . Appendectomy  1983  . Total knee arthroplasty  2003    left  . Permanent pacemaker insertion  01/01/2005    Medtronic EnRhythm  . Permanent pacemaker generator change  06/08/2011    Medtronic Adapta  . Tee without cardioversion  01/06/2005    no cardiac source of embolus   HPI:  Other Pertinent Information: [name] brought to [facility] ED 5/25 with AMS  likely due to hypernatremia as well as aspiration PNA.  Intubated  5/25-6/7.   PmHx bipolar disorder, CVA (dense left hemiplegia), CAD.  Has  had acute encephalopathy with slow resolution; noted hallucinations and  periods of catatonia. Palliative following.    No Data Recorded  Assessment / Plan / Recommendation CHL IP CLINICAL IMPRESSIONS 01/23/2015  Therapy Diagnosis (None)  Clinical Impression Pt demonstrates moderate oral and oropharyngeal  dysphagia with suspected baseline deficits exacerbated by weakness and  decreased  laryngeal sensation following prolong intubation. Oral phase is  prolonged with pumping and unnecessary mastication of liquids. Mastication  of solids is laborious due to minimal dentition. Pt holds bolus orally for  2-5 seconds prior to transit to oropharynx. Large boluses reach the  pyriform sinuses with occasional silent penetration before the swallow.  Limiting nectar to teaspoon sips eliminated penetration. Honey thick  liquids were also tolerated well though there was more oral and base of  tongue residual with honey and solids. Recommend pt initiate Dys 1 (puree)  with nectar via teaspoon. Hopeful for improvement when pt able to phonate  and cough/clear throat more effectively to sense and remove penetrate.  Will f/u for tolerance.       CHL IP TREATMENT RECOMMENDATION 01/23/2015  Treatment Recommendations Therapy as outlined in treatment plan below     CHL IP DIET RECOMMENDATION 01/23/2015  SLP Diet Recommendations Dysphagia 1 (Puree);Nectar  Liquid Administration via (None)  Medication Administration Whole meds with puree  Compensations Slow rate;Small sips/bites  Postural Changes and/or Swallow Maneuvers (None)     CHL IP OTHER RECOMMENDATIONS 01/23/2015  Recommended Consults (None)  Oral Care Recommendations Oral care BID  Other Recommendations Order thickener from pharmacy     No flowsheet data found.   CHL IP FREQUENCY AND DURATION 01/23/2015  Speech Therapy Frequency (ACUTE ONLY) min 2x/week  Treatment Duration 2 weeks     Pertinent Vitals/Pain NA    SLP Swallow Goals No flowsheet data found.  No flowsheet data found.    CHL IP REASON FOR REFERRAL 01/23/2015  Reason for Referral Objectively evaluate swallowing function     CHL IP ORAL PHASE 01/23/2015  Lips (None)  Tongue (None)  Mucous membranes (None)  Nutritional status (None)  Other (None)  Oxygen therapy (None)  Oral Phase Impaired  Oral - Pudding Teaspoon (None)  Oral - Pudding Cup (None)  Oral - Honey Teaspoon (None)  Oral - Honey Cup (None)  Oral -  Honey Syringe (None)  Oral - Nectar Teaspoon (None)  Oral - Nectar Cup (None)  Oral - Nectar Straw (None)  Oral - Nectar Syringe (None)  Oral - Ice Chips (None)  Oral - Thin Teaspoon (None)  Oral - Thin Cup (None)  Oral - Thin Straw (None)  Oral - Thin Syringe (None)  Oral - Puree (None)  Oral - Mechanical Soft (  None)  Oral - Regular (None)  Oral - Multi-consistency (None)  Oral - Pill (None)  Oral Phase - Comment (None)      CHL IP PHARYNGEAL PHASE 01/23/2015  Pharyngeal Phase Impaired  Pharyngeal - Pudding Teaspoon (None)  Penetration/Aspiration details (pudding teaspoon) (None)  Pharyngeal - Pudding Cup (None)  Penetration/Aspiration details (pudding cup) (None)  Pharyngeal - Honey Teaspoon (None)  Penetration/Aspiration details (honey teaspoon) (None)  Pharyngeal - Honey Cup (None)  Penetration/Aspiration details (honey cup) (None)  Pharyngeal - Honey Syringe (None)  Penetration/Aspiration details (honey syringe) (None)  Pharyngeal - Nectar Teaspoon (None)  Penetration/Aspiration details (nectar teaspoon) (None)  Pharyngeal - Nectar Cup (None)  Penetration/Aspiration details (nectar cup) (None)  Pharyngeal - Nectar Straw (None)  Penetration/Aspiration details (nectar straw) (None)  Pharyngeal - Nectar Syringe (None)  Penetration/Aspiration details (nectar syringe) (None)  Pharyngeal - Ice Chips (None)  Penetration/Aspiration details (ice chips) (None)  Pharyngeal - Thin Teaspoon (None)  Penetration/Aspiration details (thin teaspoon) (None)  Pharyngeal - Thin Cup (None)  Penetration/Aspiration details (thin cup) (None)  Pharyngeal - Thin Straw (None)  Penetration/Aspiration details (thin straw) (None)  Pharyngeal - Thin Syringe (None)  Penetration/Aspiration details (thin syringe') (None)  Pharyngeal - Puree (None)  Penetration/Aspiration details (puree) (None)  Pharyngeal - Mechanical Soft (None)  Penetration/Aspiration details (mechanical soft) (None)  Pharyngeal - Regular (None)  Penetration/Aspiration details  (regular) (None)  Pharyngeal - Multi-consistency (None)  Penetration/Aspiration details (multi-consistency) (None)  Pharyngeal - Pill (None)  Penetration/Aspiration details (pill) (None)  Pharyngeal Comment (None)      No flowsheet data found.  No flowsheet data found.        Harlon Ditty, MA CCC-SLP [phone]  Claudine Mouton 01/23/2015, 1:32 PM             Objective: Filed Vitals:   01/24/15 2100 01/24/15 2150 01/25/15 0421 01/25/15 0517  BP:  105/68  116/66  Pulse:  93  80  Temp:  98.8 F (37.1 C)  98.1 F (36.7 C)  TempSrc:    Oral  Resp:  16  20  Height:  (1.651 m)     Weight: 75.4 kg (166 lb 3.6 oz)  75.6 kg (166 lb 10.7 oz)   SpO2:  96%  100%    Intake/Output Summary (Last 24 hours) at 01/25/15 0635 Last data filed at 01/25/15 0535  Gross per 24 hour  Intake 2228.75 ml  Output     65 ml  Net 2163.75 ml   Weight change: -8.2 kg (-18 lb 1.2 oz) Exam:   General:  Pt is alert, follows commands appropriately, not in acute distress  HEENT: No icterus, No thrush, No neck mass, [place]/AT  Cardiovascular: RRR, S1/S2, no rubs, no gallops  Respiratory: Bibasilar crackles. Left total auscultation. No wheeze. Good air movement.   Abdomen: Soft/+BS, non tender, non distended, no guarding; no hepatosplenomegaly    Extremities: No edema, No lymphangitis, No petechiae, No rashes, no synovitis  Data Reviewed: Basic Metabolic Panel:  Recent Labs Lab 01/19/15 0525 01/20/15 0500 01/21/15 0430 01/22/15 0410 01/23/15 0222 01/24/15 0300  NA 147* 143 146* 142 147* 151*  K 4.5 4.2 4.4 4.2 4.5 5.1  CL 109 107 110 109 111 116*  CO2 GLUCOSE 111* 114* 101* 118* 85 105*  BUN 56* 70* 67* 61* 56* 55*  CREATININE 1.17* 1.24* 1.17* 1.14* 1.29* 1.58*  CALCIUM 12.7* 12.0* 11.8* 11.2* 11.8* 12.7*  MG 2.2 2.2 2.4  --   --   --  PHOS 4.1 5.0* 5.7*  --   --   --    Liver Function Tests:  Recent Labs Lab 01/24/15 0300  AST 27  ALT 16  ALKPHOS  63  BILITOT 0.7  PROT 6.9  ALBUMIN 2.9*   No results for input(s): LIPASE, AMYLASE in the last 168 hours. No results for input(s): AMMONIA in the last 168 hours. CBC:  Recent Labs Lab 01/19/15 0525 01/20/15 0500 01/21/15 0430 01/24/15 0300  WBC 9.9 10.9* 8.4 7.8  NEUTROABS 7.2 8.0* 6.1  --   HGB 8.3* 8.1* 8.0* 10.0*  HCT 27.3* 27.1* 26.9* 33.3*  MCV 89.2 89.7 91.8 91.0  PLT 182 173 152 178   Cardiac Enzymes: No results for input(s): CKTOTAL, CKMB, CKMBINDEX, TROPONINI in the last 168 hours. BNP: Invalid input(s): POCBNP CBG:  Recent Labs Lab 01/24/15 0442 01/24/15 0842 01/24/15 1129 01/24/15 1504 01/24/15 1935  GLUCAP 99 102* 141* 140* 129*    No results found for this or any previous visit (from the past 240 hour(s)).   Scheduled Meds: . amphetamine-dextroamphetamine  10 mg Oral Q breakfast  . antiseptic oral rinse  7 mL Mouth Rinse QID  . chlorhexidine  15 mL Mouth Rinse BID  . famotidine  20 mg Oral BID  . fidaxomicin  200 mg Oral BID  . heparin  5,000 Units Subcutaneous 3 times per day  . vancomycin  125 mg Oral 4 times per day   Continuous Infusions: . sodium chloride 75 mL/hr at 01/24/15 1900     Olusegun Gerstenberger, DO  [facility] Pager [phone]  If 7PM-7AM, please contact night-coverage [email or web address] Password [facility] 01/25/2015, 6:35 AM   LOS: 18 days

## 2015-01-26 LAB — PROTEIN ELECTROPHORESIS, SERUM
A/G RATIO SPE: 0.9 (ref 0.7–2.0)
ALBUMIN ELP: 2.8 g/dL — AB (ref 2.9–4.4)
Alpha-1-Globulin: 0.3 g/dL (ref 0.1–0.4)
Alpha-2-Globulin: 0.9 g/dL (ref 0.4–1.2)
Beta Globulin: 1 g/dL (ref 0.6–1.3)
GLOBULIN, TOTAL: 3.1 g/dL (ref 2.0–4.5)
Gamma Globulin: 0.9 g/dL (ref 0.5–1.6)
Total Protein ELP: 5.9 g/dL — ABNORMAL LOW (ref 6.0–8.5)

## 2015-01-26 LAB — HEMOGLOBIN A1C
HEMOGLOBIN A1C: 4.9 % (ref 4.8–5.6)
MEAN PLASMA GLUCOSE: 94 mg/dL

## 2015-01-26 LAB — BASIC METABOLIC PANEL
Anion gap: 8 (ref 5–15)
BUN: 49 mg/dL — AB (ref 6–20)
CALCIUM: 11.7 mg/dL — AB (ref 8.9–10.3)
CHLORIDE: 115 mmol/L — AB (ref 101–111)
CO2: 22 mmol/L (ref 22–32)
CREATININE: 1.73 mg/dL — AB (ref 0.44–1.00)
GFR calc non Af Amer: 31 mL/min — ABNORMAL LOW (ref >60)
GFR, EST AFRICAN AMERICAN: 36 mL/min — AB (ref >60)
Glucose, Bld: 81 mg/dL (ref 65–99)
Potassium: 4.9 mmol/L (ref 3.5–5.1)
SODIUM: 145 mmol/L (ref 135–145)

## 2015-01-26 LAB — VITAMIN D 25 HYDROXY (VIT D DEFICIENCY, FRACTURES): Vit D, 25-Hydroxy: 25.3 ng/mL — ABNORMAL LOW (ref 30.0–100.0)

## 2015-01-26 NOTE — Progress Notes (Signed)
PROGRESS NOTE  Kellie Velez:[id number] DOB: 08-07-1956 DOA: 01/07/2015 PCP: [name], MD  Significant Events: 5/25 - admitted with AMS, probable aspiration PNA, (cutlures grew MSSA) multiple metabolic derangements 5/26 - pupils changed, to CT, fevers, line was placed 5/30 - Dr Kendrick Fries discussion with s-in law - > limited code blue, continue efforts to get her off the vent - C Diff positive (reportedly 3rd episode) 5/31 - EEG x 2 c/w encephalopathy. MRI not possible due to pacer. Followed 1 command with squeeze. Still not noticed to move lowers. RUE slightly stronger 2/5 compare to LUE 1/5. Also rhythmic movement of mouth noted.  6/1 - Per Paliative MD -- patient had abrupt decline in sept and since then hospitalzed mostly in psych units 6/3 - ENT consulted - mastoid effusions related to intubaiton - serial fu in several weeks 6/7 - extubated 6/10 - TR[facility] assumed care   HPI/Subjective: The patient is alert and interactive today. She presently denies any complaints to include chest pain shortness breath fevers chills nausea vomiting or abdominal pain.She asks me to sit down and have some food with her.  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2  -Likely due to volume depletion from poor oral intake (and requirement for thickened liquids) as well as increased insensible water loss from the patient's sepsis  -continue IVF-->slowly improving  Hypercalcemia -The patient has been hypercalcemic throughout the hospitalization -Corrected calcium 14.5 on the day of admission -01/07/2015 PTH 61 -TSH--0.415 -Likely a component of hypercalcemia although immobility, but cannot rule out primary hyperparathyroidism -Check 25 vitamin D--25.3 -SPEP--pending -urine calcium/creatinine ratio-pending  Bipolar D/O with catatonia -Psychiatry to see in consultation per PCCM consultation -Family's reports that she did not do well with lithium -However, case was discussed with psychiatry 01/26/2015--> discontinue Adderall, restart lithium and Zyprexa -6/13--case discussed with Dr. 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 - remains on oral vancomycin and fidaxomicin - continue treatment and isolation -01/25/2015--today is the last day for fidaxomicin and oral vancomycin -Start florastor  Hx of CVA(R-MCA), dense left sided hemiplegia -PT/OT -likely return to SNF -Restart aspirin 81 mg daily   acute pulm edema Resolved  Protein calorie malnutrition Cleared for D1 w/ nectar liquids per SLP - follow intake  Hyperglycemia - no hx of DM Resolved - discontinue CBG checks Check hemoglobin A1c--4.9 Hyperglycemia is likely stressed induced   Code Status: NO CPR - NO CARDIOVERSION (YES to intubation, vasoactive or antiarrhythmic drugs) Family Communication: no family present at time of  exam Disposition Plan: stable for transfer to medical bed -  begin PT/OT - awaiting psychiatric recommendations - ultimate disposition will be transfer to SNF  Consultants: PCCM Psychiatry Palliative care       Procedures/Studies: Ct Head Wo Contrast  01/14/2015   CLINICAL DATA:  59 year old female with continued encephalopathy. Intubated. Initial encounter.  EXAM: CT HEAD WITHOUT CONTRAST  TECHNIQUE: Contiguous axial images were obtained from the base of the skull through the vertex without intravenous contrast.  COMPARISON:  01/08/2015 and earlier.  FINDINGS: Partially visible endotracheal and oral enteric tubes. Stable left maxillary sinus mucous retention cyst. Left tympanic and mastoid opacification appears increased. Small volume layering fluid in the pharynx.  No acute osseous abnormality identified. No acute orbit or scalp soft tissue findings.  Calcified atherosclerosis at the skull base. Compared to 01/07/2015, abnormal right MCA territory hypodensity has not changed. This is a new finding compared to 2013. No associated mass effect is evident. No associated hemorrhage. No ventriculomegaly. Patchy hypodensity in the left MCA white matter also appears stable, and was partially present in 2013. Chronic left cerebellar infarct is unchanged since 2013. No suspicious intracranial vascular hyperdensity. No acute intracranial hemorrhage identified.  IMPRESSION: 1. Subacute versus chronic right greater than left MCA territory ischemia appears unchanged since 01/07/2015. Some of the left MCA findings were present in 2013. No associated hemorrhage or mass effect. 2. Chronic left cerebellar infarct is stable since 2013. 3. Left mastoid and tympanic effusion likely secondary to intubation.   Electronically Signed   By: Odessa Fleming M.D.   On: 01/14/2015 14:56   Ct Head Wo Contrast  01/08/2015   CLINICAL DATA:  Unresponsive, pupil asymmetry, follow-up  EXAM: CT HEAD WITHOUT CONTRAST  TECHNIQUE: Contiguous  axial images were obtained from the base of the skull through the vertex without intravenous contrast.  COMPARISON:  01/07/2015  FINDINGS: No evidence of parenchymal hemorrhage or extra-axial fluid collection. No mass lesion, mass effect, or midline shift.  No CT evidence of acute infarction.  Focal hypodensity within the right frontoparietal region (series 2/ image 21), favored to reflect encephalomalacic changes related to prior right MCA distribution infarct, likely chronic although possibly subacute. The appearance on the current CT is slightly unusual with apparent preservation of the overlying cortical rim, although this apparent difference may be simply technical/artifactual.  Subcortical white matter and periventricular small vessel ischemic changes. Mild cranial atherosclerosis.  Mild cortical atrophy.  No ventriculomegaly.  Partial opacification of the left maxillary sinus. Partial opacification of the bilateral mastoid air cells, left greater than right.  No evidence of calvarial fracture.  IMPRESSION: Suspected prior right MCA distribution infarct, favored to be chronic, less likely subacute.  Otherwise, no evidence of acute intracranial abnormality.   Electronically Signed   By: Charline Bills M.D.   On: 01/08/2015 12:16   Ct Head Wo Contrast  01/07/2015   CLINICAL DATA:  Altered mental status.  Code sepsis.  EXAM: CT HEAD WITHOUT CONTRAST  TECHNIQUE: Contiguous axial images were obtained from the base of the skull through the vertex without intravenous contrast.  COMPARISON:  Head CT scan 07/20/2012.  FINDINGS: Since prior examination, the patient has suffered a right MCA infarct which appears remote. There is chronic microvascular ischemic change. No evidence of acute abnormality including infarct, hemorrhage, mass lesion, mass effect, midline shift or abnormal extra-axial fluid collections identified. No hydrocephalus or pneumocephalus. Mucous retention cyst or polyp left maxillary sinus is  noted.  IMPRESSION: No acute abnormality.  Remote right MCA infarct.  Chronic microvascular ischemic change.   Electronically Signed  By: Drusilla Kanner M.D.   On: 01/07/2015 12:29   Dg Chest Port 1 View  01/19/2015   CLINICAL DATA:  Shortness of breath.  EXAM: PORTABLE CHEST - 1 VIEW  COMPARISON:  01/18/2015.  FINDINGS: Patient rotated to the left. Endotracheal tube tip 1.8 cm above the carina. Right IJ line and NG tube in stable position. Mediastinum hilar structures are normal. Heart size stable. Cardiac pacer in stable position. Persistent bilateral lower lobe infiltrates and atelectatic changes are present. No interim change. No pleural effusion or pneumothorax.  IMPRESSION: 1. Lines and tubes in stable position. 2. Persistent bilateral lower lobe infiltrates and atelectasis. No interim change.   Electronically Signed   By: Maisie Fus  Register   On: 01/19/2015 07:09   Dg Chest Port 1 View  01/18/2015   CLINICAL DATA:  Acute respiratory failure. On ventilator.  Hypoxia  EXAM: PORTABLE CHEST - 1 VIEW  COMPARISON:  01/17/2015  FINDINGS: Patient is rotated to the left on today's exam. Support lines and tubes in appropriate position. Bilateral lower lobe airspace disease is again seen, left side greater than right, without significant change. No definite pneumothorax visualized.  IMPRESSION: Left greater than right lower lobe airspace disease, without significant change.   Electronically Signed   By: Myles Rosenthal M.D.   On: 01/18/2015 09:09   Dg Chest Port 1 View  01/17/2015   CLINICAL DATA:  Endotracheal tube present.  EXAM: PORTABLE CHEST - 1 VIEW  COMPARISON:  One-view chest 01/16/2015  FINDINGS: The heart size is normal. A left pleural effusion and basilar airspace disease are again noted. The endotracheal tube terminates 3 cm above the carina. Pacing wires are stable. The NG tube courses off the inferior border the film. A right IJ line terminates at cavoatrial junction. Mild edema is present.  IMPRESSION:  1. No significant change in mild edema, left pleural effusion and basilar airspace disease likely atelectasis. 2. Stable and satisfactory positioning of the support apparatus.   Electronically Signed   By: Marin Roberts M.D.   On: 01/17/2015 07:33   Dg Chest Port 1 View  01/16/2015   CLINICAL DATA:  Hypoxia  EXAM: PORTABLE CHEST - 1 VIEW  COMPARISON:  Jan 12, 2015  FINDINGS: Endotracheal tube tip is 4.7 cm above the carina. Central catheter tip is essentially at the cavoatrial junction. Nasogastric tube tip and side port are below the diaphragm. No pneumothorax. There is persistent airspace consolidation on the left with minimal left effusion. Right lung is clear. Heart size and pulmonary vascularity are normal. No adenopathy. Pacemaker leads are attached to the right atrium and right ventricle.  IMPRESSION: Tube and catheter positions as described without pneumothorax. Persistent left lower lobe consolidation with small left effusion. No new opacity.   Electronically Signed   By: Bretta Bang III M.D.   On: 01/16/2015 07:14   Dg Chest Port 1 View  01/12/2015   CLINICAL DATA:  Hypoxia with acute respiratory failure  EXAM: PORTABLE CHEST - 1 VIEW  COMPARISON:  01/11/2015  FINDINGS: Cardiac shadow is stable. A pacing device, endotracheal tube, nasogastric catheter and right central venous line are again seen and stable. Bibasilar densities are seen slightly increased on the left when compared with the with the previous day.  IMPRESSION: Tubes and lines as described.  Bibasilar changes with slight increase on the left when compared with the previous day.   Electronically Signed   By: Alcide Clever M.D.   On: 01/12/2015 07:35   Dg  Chest Port 1 View  01/11/2015   CLINICAL DATA:  Acute respiratory failure  EXAM: PORTABLE CHEST - 1 VIEW  COMPARISON:  01/10/2015  FINDINGS: Bibasilar airspace opacities are stable. There are no new lung abnormalities.  No pneumothorax.  Probable small effusions.   Endotracheal tube tip is not well defined on this exam, but appears to project approximate 5 mm above the carina.  Orogastric tube and right internal jugular central venous line are stable and well positioned.  IMPRESSION: 1. No change from the previous day's study. 2. Persistent basilar airspace opacity which may reflect pneumonia, atelectasis or a combination. No evidence of pulmonary edema. 3. Support apparatus stable position.   Electronically Signed   By: Amie Portland M.D.   On: 01/11/2015 07:13   Dg Chest Port 1 View  01/10/2015   CLINICAL DATA:  Acute respiratory failure.  EXAM: PORTABLE CHEST - 1 VIEW  COMPARISON:  01/09/2015  FINDINGS: Nasogastric tube tip is in the stomach. The right IJ catheter tip is in the projection of the cavoatrial junction. Left chest wall ICD is noted with leads in the right atrial appendage and right ventricle. No significant change in bilateral lower lobe airspace opacities.  IMPRESSION: 1. No change in bilateral lower lobe airspace opacities. 2. Stable support apparatus.   Electronically Signed   By: Signa Kell M.D.   On: 01/10/2015 09:20   Dg Chest Port 1 View  01/09/2015   CLINICAL DATA:  Pneumonia.  EXAM: PORTABLE CHEST - 1 VIEW  COMPARISON:  01/08/2015.  FINDINGS: Endotracheal tube, NG tube, right IJ line in stable position. Mediastinum hilar structures are stable. Cardiac pacer lead tips in right atrium right ventricle. Heart size stable. The persistent unchanged prominent bibasilar pulmonary infiltrates consistent with pneumonia. No pleural effusion or pneumothorax.  IMPRESSION: 1.  Lines and tubes in stable position.  2. Persistent prominent bilateral lower lobe pulmonary infiltrates consistent with pneumonia.   Electronically Signed   By: Maisie Fus  Register   On: 01/09/2015 07:29   Dg Chest Port 1 View  01/08/2015   CLINICAL DATA:  Respiratory failure, sepsis, current tobacco use  EXAM: PORTABLE CHEST - 1 VIEW  COMPARISON:  Portable chest x-ray of Jan 07, 2015  FINDINGS: The lungs are adequately inflated. There is persistent interstitial and alveolar opacity in the right lower lobe. Persistent increased retrocardiac density is demonstrated but slight improved aeration present here. The cardiac silhouette is normal in size. The pulmonary vascularity is not engorged. The endotracheal tube tip lies 2.8 cm above the carina. The esophagogastric tube tip projects below the inferior margin of the image. The right internal jugular venous catheter tip projects over the midportion of the SVC. The permanent pacemaker is in reasonable position. The bony thorax exhibits no acute abnormalities.  IMPRESSION: Persistent bibasilar atelectasis and/or pneumonia. Slight interval improvement in the left retrocardiac region has occurred. The support tubes and lines are in reasonable position.   Electronically Signed   By: Loveah Like  [place] M.D.   On: 01/08/2015 07:54   Dg Chest Port 1 View  01/07/2015   CLINICAL DATA:  Central line placement.  EXAM: PORTABLE CHEST - 1 VIEW  COMPARISON:  Same day.  FINDINGS: Stable cardiomediastinal silhouette. Endotracheal and nasogastric tubes are in grossly good position. Left-sided pacemaker is unchanged in position. Interval placement of right internal jugular catheter line is noted with distal tip in expected position of the cavoatrial junction. No pneumothorax is noted. Left lung is clear. Right basilar opacity is again  noted concerning for pneumonia or atelectasis. No significant pleural effusion is noted.  IMPRESSION: Interval placement of right internal jugular catheter is noted with distal tip in expected position of cavoatrial junction. No pneumothorax is noted. Stable right basilar opacity is noted concerning for pneumonia or atelectasis.   Electronically Signed   By: Lupita Raider, M.D.   On: 01/07/2015 19:01   Dg Chest Port 1 View  01/07/2015   CLINICAL DATA:  Respiratory failure endotracheal tube placement  EXAM: PORTABLE CHEST - 1 VIEW   COMPARISON:  08/04/2014  FINDINGS: Cardiomegaly again noted. Dual lead cardiac pacemaker is unchanged in position. Hazy right infrahilar atelectasis or infiltrate. There is NG tube in place. Endotracheal tube with tip at the level of carina towards right main bronchus. Endotracheal tube should be retracted about 1.5 cm. There is no pneumothorax.  IMPRESSION: Dual lead cardiac pacemaker is unchanged in position. Hazy right infrahilar atelectasis or infiltrate. There is NG tube in place. Endotracheal tube with tip at the level of carina towards right main bronchus. Endotracheal tube should be retracted about 1.5 cm. There is no pneumothorax.  These results were called by telephone at the time of interpretation on 01/07/2015 at 11:53 am to Dr. Geoffery Lyons , who verbally acknowledged these results.   Electronically Signed   By: Natasha Mead M.D.   On: 01/07/2015 11:53   Dg Abd Portable 1v  01/20/2015   CLINICAL DATA:  Evaluate NG tube placement  EXAM: PORTABLE ABDOMEN - 1 VIEW  COMPARISON:  01/10/2015  FINDINGS: NG huge it is in the peripyloric region with the side port in the antrum. Prior cholecystectomy. Nonobstructive bowel gas pattern.  IMPRESSION: NG tube tip in the peripyloric region.   Electronically Signed   By: Charlett Nose M.D.   On: 01/20/2015 17:15   Dg Abd Portable 1v  01/10/2015   CLINICAL DATA:  Orogastric tube placement.  EXAM: PORTABLE ABDOMEN - 1 VIEW  COMPARISON:  Chest x-ray today.  FINDINGS: Enteric tube is present which has tip right of midline in the upper abdomen likely over the distal stomach/ gastroduodenal junction. Bowel gas pattern is nonobstructive. IVC filter is present with tip just left of midline at the L2 level as patient is slightly rotated to the left. There are mild degenerative changes of the spine. There are several pelvic phleboliths. A small caliber rectal catheter is present.  IMPRESSION: Nonobstructive bowel gas pattern.  Enteric tube with tip right of midline over the  upper abdomen likely over the distal stomach or gastroduodenal junction.   Electronically Signed   By: Elberta Fortis M.D.   On: 01/10/2015 12:12   Dg Swallowing Func-speech Pathology  01/23/2015    Objective Swallowing Evaluation:    Patient Details  Name: LUBERTA GRABINSKI MRN: [id number] Date of Birth: Mar 30, 1956  Today's Date: 01/23/2015 Time: SLP Start Time (ACUTE ONLY): 1010-SLP Stop Time (ACUTE ONLY): 1030 SLP Time Calculation (min) (ACUTE ONLY): 20 min  Past Medical History:  Past Medical History  Diagnosis Date  . Arthritis   . Hypertension   . Nerve damage   . Osteoporosis   . Hearing loss   . Abdominal pain   . Stroke     TIA's  . Bradycardia, sinus, persistent, severe   . 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 Bipolar 1 disorder   . Heart murmur 08/13/2012    EF 60-65%,severe LVH,Mild AOV regurg, MR,trivial TR  . Mild carotid  artery disease 05/05/2005,08/13/2012    left ICA 0-49% diameter reduction 2006; bilateral fibrous plaque mild  2013  . LVH (left ventricular hypertrophy)    Past Surgical History:  Past Surgical History  Procedure Laterality Date  . Cholecystectomy  1980  . Appendectomy  1983  . Total knee arthroplasty  2003    left  . Permanent pacemaker insertion  01/01/2005    Medtronic EnRhythm  . Permanent pacemaker generator change  06/08/2011    Medtronic Adapta  . Tee without cardioversion  01/06/2005    no cardiac source of embolus   HPI:  Other Pertinent Information: 60 y.o. F brought to [facility] ED 5/25 with AMS  likely due to hypernatremia as well as aspiration PNA.  Intubated  5/25-6/7.   PmHx bipolar disorder, CVA (dense left hemiplegia), CAD.  Has  had acute encephalopathy with slow resolution; noted hallucinations and  periods of catatonia. Palliative following.    No Data Recorded  Assessment / Plan / Recommendation CHL IP CLINICAL IMPRESSIONS 01/23/2015  Therapy Diagnosis (None)  Clinical Impression Pt demonstrates moderate oral and  oropharyngeal  dysphagia with suspected baseline deficits exacerbated by weakness and  decreased laryngeal sensation following prolong intubation. Oral phase is  prolonged with pumping and unnecessary mastication of liquids. Mastication  of solids is laborious due to minimal dentition. Pt holds bolus orally for  2-5 seconds prior to transit to oropharynx. Large boluses reach the  pyriform sinuses with occasional silent penetration before the swallow.  Limiting nectar to teaspoon sips eliminated penetration. Honey thick  liquids were also tolerated well though there was more oral and base of  tongue residual with honey and solids. Recommend pt initiate Dys 1 (puree)  with nectar via teaspoon. Hopeful for improvement when pt able to phonate  and cough/clear throat more effectively to sense and remove penetrate.  Will f/u for tolerance.       CHL IP TREATMENT RECOMMENDATION 01/23/2015  Treatment Recommendations Therapy as outlined in treatment plan below     CHL IP DIET RECOMMENDATION 01/23/2015  SLP Diet Recommendations Dysphagia 1 (Puree);Nectar  Liquid Administration via (None)  Medication Administration Whole meds with puree  Compensations Slow rate;Small sips/bites  Postural Changes and/or Swallow Maneuvers (None)     CHL IP OTHER RECOMMENDATIONS 01/23/2015  Recommended Consults (None)  Oral Care Recommendations Oral care BID  Other Recommendations Order thickener from pharmacy     No flowsheet data found.   CHL IP FREQUENCY AND DURATION 01/23/2015  Speech Therapy Frequency (ACUTE ONLY) min 2x/week  Treatment Duration 2 weeks     Pertinent Vitals/Pain NA    SLP Swallow Goals No flowsheet data found.  No flowsheet data found.    CHL IP REASON FOR REFERRAL 01/23/2015  Reason for Referral Objectively evaluate swallowing function     CHL IP ORAL PHASE 01/23/2015  Lips (None)  Tongue (None)  Mucous membranes (None)  Nutritional status (None)  Other (None)  Oxygen therapy (None)  Oral Phase Impaired  Oral - Pudding Teaspoon  (None)  Oral - Pudding Cup (None)  Oral - Honey Teaspoon (None)  Oral - Honey Cup (None)  Oral - Honey Syringe (None)  Oral - Nectar Teaspoon (None)  Oral - Nectar Cup (None)  Oral - Nectar Straw (None)  Oral - Nectar Syringe (None)  Oral - Ice Chips (None)  Oral - Thin Teaspoon (None)  Oral - Thin Cup (None)  Oral - Thin Straw (None)  Oral - Thin Syringe (None)  Oral - Puree (  None)  Oral - Mechanical Soft (None)  Oral - Regular (None)  Oral - Multi-consistency (None)  Oral - Pill (None)  Oral Phase - Comment (None)      CHL IP PHARYNGEAL PHASE 01/23/2015  Pharyngeal Phase Impaired  Pharyngeal - Pudding Teaspoon (None)  Penetration/Aspiration details (pudding teaspoon) (None)  Pharyngeal - Pudding Cup (None)  Penetration/Aspiration details (pudding cup) (None)  Pharyngeal - Honey Teaspoon (None)  Penetration/Aspiration details (honey teaspoon) (None)  Pharyngeal - Honey Cup (None)  Penetration/Aspiration details (honey cup) (None)  Pharyngeal - Honey Syringe (None)  Penetration/Aspiration details (honey syringe) (None)  Pharyngeal - Nectar Teaspoon (None)  Penetration/Aspiration details (nectar teaspoon) (None)  Pharyngeal - Nectar Cup (None)  Penetration/Aspiration details (nectar cup) (None)  Pharyngeal - Nectar Straw (None)  Penetration/Aspiration details (nectar straw) (None)  Pharyngeal - Nectar Syringe (None)  Penetration/Aspiration details (nectar syringe) (None)  Pharyngeal - Ice Chips (None)  Penetration/Aspiration details (ice chips) (None)  Pharyngeal - Thin Teaspoon (None)  Penetration/Aspiration details (thin teaspoon) (None)  Pharyngeal - Thin Cup (None)  Penetration/Aspiration details (thin cup) (None)  Pharyngeal - Thin Straw (None)  Penetration/Aspiration details (thin straw) (None)  Pharyngeal - Thin Syringe (None)  Penetration/Aspiration details (thin syringe') (None)  Pharyngeal - Puree (None)  Penetration/Aspiration details (puree) (None)  Pharyngeal - Mechanical Soft (None)   Penetration/Aspiration details (mechanical soft) (None)  Pharyngeal - Regular (None)  Penetration/Aspiration details (regular) (None)  Pharyngeal - Multi-consistency (None)  Penetration/Aspiration details (multi-consistency) (None)  Pharyngeal - Pill (None)  Penetration/Aspiration details (pill) (None)  Pharyngeal Comment (None)      No flowsheet data found.  No flowsheet data found.        Harlon Ditty, [place] CCC-SLP [phone]  [place], Riley Nearing 01/23/2015, 1:32 PM          Subjective:  patient is awake and alert. She is able to eat today with assistance. No reports of vomiting, uncontrolled pain, respiratory distress. She denies any shortness of breath or pain at this time. She is pleasantly confused. The patient intermittently follows one-step commands. Otherwise she refuses to answer any other questions. For the remainder of the examination she would speak unrelated topics   Objective: Filed Vitals:   01/25/15 1657 01/25/15 2250 01/26/15 0657 01/26/15 0726  BP: 107/70 103/62  122/76  Pulse: 83 81  79  Temp: 97.7 F (36.5 C) 98 F (36.7 C)  98 F (36.7 C)  TempSrc: Oral   Oral  Resp: 16 16  20   Height:      Weight:   78.4 kg (172 lb 13.5 oz)   SpO2: 96% 100%  96%    Intake/Output Summary (Last 24 hours) at 01/26/15 1546 Last data filed at 01/26/15 0850  Gross per 24 hour  Intake   1290 ml  Output      0 ml  Net   1290 ml   Weight change: 3 kg (6 lb 9.8 oz) Exam:   General:  Pt is alert, follows commands appropriately, not in acute distress  HEENT: No icterus, No thrush, [place]/AT; no meningismus  Cardiovascular: RRR, S1/S2, no rubs, no gallops  Respiratory: bibasilar rales, right greater than left. No wheeze   Abdomen: Soft/+BS, non tender, non distended, no guarding; no hepatosplenomegaly   Extremities: No edema, No lymphangitis, No petechiae, No rashes, no synovitis; no cyanosis or clubbing   Data Reviewed: Basic Metabolic Panel:  Recent Labs Lab  01/20/15 0500 01/21/15 0430 01/22/15 0410 01/23/15 0222 01/24/15 0300 01/25/15 0559 01/26/15 5859  NA 143 146* 142 147* 151* 153* 145  K 4.2 4.4 4.2 4.5 5.1 4.7 4.9  CL 107 110 109 111 116* 117* 115*  CO2 27 28 24 24 23 22 22   GLUCOSE 114* 101* 118* 85 105* 96 81  BUN 70* 67* 61* 56* 55* 53* 49*  CREATININE 1.24* 1.17* 1.14* 1.29* 1.58* 1.79* 1.73*  CALCIUM 12.0* 11.8* 11.2* 11.8* 12.7* 12.8* 11.7*  MG 2.2 2.4  --   --   --   --   --   PHOS 5.0* 5.7*  --   --   --   --   --    Liver Function Tests:  Recent Labs Lab 01/24/15 0300 01/25/15 0559  AST 27 15  ALT 16 13*  ALKPHOS 63 58  BILITOT 0.7 0.4  PROT 6.9 6.6  ALBUMIN 2.9* 2.9*   No results for input(s): LIPASE, AMYLASE in the last 168 hours. No results for input(s): AMMONIA in the last 168 hours. CBC:  Recent Labs Lab 01/20/15 0500 01/21/15 0430 01/24/15 0300  WBC 10.9* 8.4 7.8  NEUTROABS 8.0* 6.1  --   HGB 8.1* 8.0* 10.0*  HCT 27.1* 26.9* 33.3*  MCV 89.7 91.8 91.0  PLT 173 152 178   Cardiac Enzymes: No results for input(s): CKTOTAL, CKMB, CKMBINDEX, TROPONINI in the last 168 hours. BNP: Invalid input(s): POCBNP CBG:  Recent Labs Lab 01/24/15 0842 01/24/15 1129 01/24/15 1504 01/24/15 1935 01/25/15 0915  GLUCAP 102* 141* 140* 129* 81    No results found for this or any previous visit (from the past 240 hour(s)).   Scheduled Meds: . amphetamine-dextroamphetamine  10 mg Oral Q breakfast  . antiseptic oral rinse  7 mL Mouth Rinse QID  . aspirin  81 mg Oral Daily  . chlorhexidine  15 mL Mouth Rinse BID  . famotidine  20 mg Oral BID  . heparin  5,000 Units Subcutaneous 3 times per day  . saccharomyces boulardii  250 mg Oral BID   Continuous Infusions: . dextrose 75 mL/hr at 01/26/15 1300     Refujio Haymer, DO  [facility] Pager [phone]  If 7PM-7AM, please contact night-coverage [email or web address] Password [email or web address] 01/26/2015, 3:46 PM   LOS: 19 days

## 2015-01-26 NOTE — Consult Note (Addendum)
Psychiatry Consult Follow Up  Reason for Consult:  Bipolar with catatonia and AMS Referring Physician:  Dr. Carles Collet Patient Identification: Kellie Velez MRN:  [id number] Principal Diagnosis: Bipolar I disorder with catatonia Diagnosis:   Patient Active Problem List   Diagnosis Date Noted  . Hypercalcemia [E83.52] 01/25/2015  . Dysphagia [R13.10] 01/25/2015  . Bipolar I disorder with catatonia [F31.9] 01/23/2015  . Colitis, Clostridium difficile [A04.7]   . Palliative care encounter [Z51.5]   . Encephalopathy [G93.40]   . Acute pulmonary edema [J81.0]   . Acute respiratory failure with hypoxia [J96.01]   . Hypernatremia [E87.0]   . MSSA (methicillin susceptible Staphylococcus aureus) pneumonia [J15.211]   . Encephalopathy acute [G93.40]   . Pressure ulcer [L89.90] 01/09/2015  . ARF (acute respiratory failure) [J96.00]   . Pneumonia [J18.9]   . Pupil asymmetry [H57.02]   . Altered mental status [R41.82] 01/07/2015  . Respiratory failure [J96.90]   . Blood poisoning [A41.9]   . Bipolar disorder, current episode mixed, severe, with psychotic features [F31.64]   . Psychoses [F29]   . Pacemaker [Z95.0] 12/31/2013  . Unspecified essential hypertension [I10] 09/28/2012  . Bipolar disorder, unspecified [F31.9] 09/28/2012  . TIA (transient ischemic attack) [G45.9] 09/28/2012  . Mild carotid artery disease [I77.9] 09/28/2012  . Osteoporosis, unspecified [M81.0] 09/28/2012  . Bradycardia [R00.1] 09/28/2012  . Heart murmur [R01.1] 09/28/2012  . Hearing loss [H91.90] 09/28/2012  . Arthritis [M19.90] 09/28/2012  . LVH (left ventricular hypertrophy) [I51.7] 09/28/2012    Total Time spent with patient: 30 minutes  Subjective:   Kellie Velez is a 59 y.o. female patient admitted with AMS.  HPI: Kellie Velez is an 59 y.o. Woman seen face-to-face for psychiatric consultation and evaluation of bipolar disorder with catatonia. Patient is a poor historian unable to give me details about her  previous treatment, medications and safety concerns. Patient appeared sitting in her bed, awake, alert, oriented and able to eat her soft food with the help of staff RN today. Patient has a history of bipolar disorder and schizoaffective disorder and was at [facility] 07/21/14-07/29/14 due to psychotic symptoms. Patient has tangential and circumstantial thinking process. Review of her medical records indicated patient presented previously with significant symptoms of mania and depression. Reportedly patient is also noncompliant with her medication management which may be reason for relapse of her symptoms.  Interval history: Patient is seen today for psychiatric consultation follow-up. Patient has multiple family members in her room including her aunt, uncle, mother-in-law and her friend. Patient mother left stated that patient has been local nursing home since he was discharged from the long-term hospitalization in [place]. Patient admitted to the hospital with the aspiration pneumonia and the partly with catatonia symptoms. Patient has been communicating with all family members and able to eat her meals. Patient continued to have tangential and circumstantial thought process.. Patient has been more stable medically at this time. Patient has no current suicidal/homicidal ideation, intention or plans. Patient has no evidence of auditory or visual hallucinations but continued to have delusional thinking.  Past Medical History:  Past Medical History  Diagnosis Date  . Arthritis   . Hypertension   . Nerve damage   . Osteoporosis   . Hearing loss   . Abdominal pain   . Stroke     TIA's  . Bradycardia, sinus, persistent, severe   . 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 Bipolar 1 disorder   .  Heart murmur 08/13/2012    EF 60-65%,severe LVH,Mild AOV regurg, MR,trivial TR  . Mild carotid artery disease  05/05/2005,08/13/2012    left ICA 0-49% diameter reduction 2006; bilateral fibrous plaque mild 2013  . LVH (left ventricular hypertrophy)     Past Surgical History  Procedure Laterality Date  . Cholecystectomy  1980  . Appendectomy  1983  . Total knee arthroplasty  2003    left  . Permanent pacemaker insertion  01/01/2005    Medtronic EnRhythm  . Permanent pacemaker generator change  06/08/2011    Medtronic Adapta  . Tee without cardioversion  01/06/2005    no cardiac source of embolus   Family History: History reviewed. No pertinent family history. Social History:  History  Alcohol Use No     History  Drug Use No    History   Social History  . Marital Status: Married    Spouse Name: N/A  . Number of Children: N/A  . Years of Education: N/A   Social History Main Topics  . Smoking status: Current Every Day Smoker -- 0.50 packs/day    Types: Cigarettes  . Smokeless tobacco: Not on file  . Alcohol Use: No  . Drug Use: No  . Sexual Activity: Not on file   Other Topics Concern  . None   Social History Narrative   Additional Social History:                          Allergies:   Allergies  Allergen Reactions  . Floxin [Ofloxacin] Nausea And Vomiting and Rash  . Plavix [Clopidogrel Bisulfate]     "stomach pain"  . Epinephrine Other (See Comments)    "makes her feel excited"    Labs:  Results for orders placed or performed during the hospital encounter of 01/07/15 (from the past 48 hour(s))  Glucose, capillary     Status: Abnormal   Collection Time: 01/24/15  3:04 PM  Result Value Ref Range   Glucose-Capillary 140 (H) 65 - 99 mg/dL   Comment 1 Notify RN    Comment 2 Document in Chart   Glucose, capillary     Status: Abnormal   Collection Time: 01/24/15  7:35 PM  Result Value Ref Range   Glucose-Capillary 129 (H) 65 - 99 mg/dL   Comment 1 Notify RN   Comprehensive metabolic panel     Status: Abnormal   Collection Time: 01/25/15  5:59 AM  Result  Value Ref Range   Sodium 153 (H) 135 - 145 mmol/L   Potassium 4.7 3.5 - 5.1 mmol/L   Chloride 117 (H) 101 - 111 mmol/L   CO2 22 22 - 32 mmol/L   Glucose, Bld 96 65 - 99 mg/dL   BUN 53 (H) 6 - 20 mg/dL   Creatinine, Ser 1.79 (H) 0.44 - 1.00 mg/dL   Calcium 12.8 (H) 8.9 - 10.3 mg/dL   Total Protein 6.6 6.5 - 8.1 g/dL   Albumin 2.9 (L) 3.5 - 5.0 g/dL   AST 15 15 - 41 U/L   ALT 13 (L) 14 - 54 U/L   Alkaline Phosphatase 58 38 - 126 U/L   Total Bilirubin 0.4 0.3 - 1.2 mg/dL   GFR calc non Af Amer 29 (L) >60 mL/min   GFR calc Af Amer 34 (L) >60 mL/min    Comment: (NOTE) The eGFR has been calculated using the CKD EPI equation. This calculation has not been validated in all clinical situations.  eGFR's persistently <60 mL/min signify possible Chronic Kidney Disease.    Anion gap 14 5 - 15  Glucose, capillary     Status: None   Collection Time: 01/25/15  9:15 AM  Result Value Ref Range   Glucose-Capillary 81 65 - 99 mg/dL  Vit D  25 hydroxy (rtn osteoporosis monitoring)     Status: Abnormal   Collection Time: 01/25/15 11:44 AM  Result Value Ref Range   Vit D, 25-Hydroxy 25.3 (L) 30.0 - 100.0 ng/mL    Comment: (NOTE) Vitamin D deficiency has been defined by the Institute of Medicine and an Endocrine Society practice guideline as a level of serum 25-OH vitamin D less than 20 ng/mL (1,2). The Endocrine Society went on to further define vitamin D insufficiency as a level between 21 and 29 ng/mL (2). 1. IOM (Institute of Medicine). 2010. Dietary reference   intakes for calcium and D. [place]: The   [employer]. 2. Holick MF, [place], Bischoff-Ferrari HA, et al.   Evaluation, treatment, and prevention of vitamin D   deficiency: an Endocrine Society clinical practice   guideline. JCEM. 2011 Jul; 96(7):1911-30. Performed At: [facility] [place], [place] [id number] Lindon Romp MD VQ:[id number]   Basic metabolic panel     Status: Abnormal    Collection Time: 01/26/15  5:23 AM  Result Value Ref Range   Sodium 145 135 - 145 mmol/L   Potassium 4.9 3.5 - 5.1 mmol/L    Comment: SPECIMEN HEMOLYZED. HEMOLYSIS MAY AFFECT INTEGRITY OF RESULTS.   Chloride 115 (H) 101 - 111 mmol/L   CO2 22 22 - 32 mmol/L   Glucose, Bld 81 65 - 99 mg/dL   BUN 49 (H) 6 - 20 mg/dL   Creatinine, Ser 1.73 (H) 0.44 - 1.00 mg/dL   Calcium 11.7 (H) 8.9 - 10.3 mg/dL   GFR calc non Af Amer 31 (L) >60 mL/min   GFR calc Af Amer 36 (L) >60 mL/min    Comment: (NOTE) The eGFR has been calculated using the CKD EPI equation. This calculation has not been validated in all clinical situations. eGFR's persistently <60 mL/min signify possible Chronic Kidney Disease.    Anion gap 8 5 - 15    Vitals: Blood pressure 122/76, pulse 79, temperature 98 F (36.7 C), temperature source Oral, resp. rate 20, height 5' 5"  (1.651 m), weight 78.4 kg (172 lb 13.5 oz), SpO2 96 %.  Risk to Self: Is patient at risk for suicide?: No Risk to Others:   Prior Inpatient Therapy:   Prior Outpatient Therapy:    Current Facility-Administered Medications  Medication Dose Route Frequency Provider Last Rate Last Dose  . acetaminophen (TYLENOL) suppository 650 mg  650 mg Rectal Q6H PRN Cherene Altes, MD      . acetaminophen (TYLENOL) tablet 650 mg  650 mg Oral Q6H PRN Cherene Altes, MD   650 mg at 01/24/15 0923  . amphetamine-dextroamphetamine (ADDERALL) tablet 10 mg  10 mg Oral Q breakfast Rigoberto Noel, MD   10 mg at 01/26/15 4332  . antiseptic oral rinse (CPC / CETYLPYRIDINIUM CHLORIDE 0.05%) solution 7 mL  7 mL Mouth Rinse QID Raylene Miyamoto, MD   7 mL at 01/26/15 1200  . aspirin chewable tablet 81 mg  81 mg Oral Daily Orson Eva, MD   81 mg at 01/26/15 1105  . chlorhexidine (PERIDEX) 0.12 % solution 15 mL  15 mL Mouth Rinse BID Raylene Miyamoto, MD  15 mL at 01/26/15 0900  . dextrose 5 % solution   Intravenous Continuous Orson Eva, MD 75 mL/hr at 01/26/15 1300    .  famotidine (PEPCID) 40 MG/5ML suspension 20 mg  20 mg Oral BID Cherene Altes, MD   20 mg at 01/26/15 1000  . heparin injection 5,000 Units  5,000 Units Subcutaneous 3 times per day Rahul Dianna Rossetti, PA-C   5,000 Units at 01/26/15 0631  . [place]   Oral PRN Cherene Altes, MD      . saccharomyces boulardii [facility]) capsule 250 mg  250 mg Oral BID Orson Eva, MD   250 mg at 01/26/15 1105    Musculoskeletal: Strength & Muscle Tone: decreased Gait & Station: unable to stand Patient leans: N/A  Psychiatric Specialty Exam: Physical Exam   ROS    Blood pressure 122/76, pulse 79, temperature 98 F (36.7 C), temperature source Oral, resp. rate 20, height 5' 5"  (1.651 m), weight 78.4 kg (172 lb 13.5 oz), SpO2 96 %.Body mass index is 28.76 kg/(m^2).  General Appearance: Guarded  Eye Contact::  Fair  Speech:  Pressured  Volume:  Decreased  Mood:  Anxious and Depressed  Affect:  Constricted and Depressed  Thought Process:  Circumstantial, Disorganized, Irrelevant and Tangential  Orientation:  Full (Time, Place, and Person)  Thought Content:  Paranoid Ideation and Rumination  Suicidal Thoughts:  No  Homicidal Thoughts:  No  Memory:  Immediate;   Fair Recent;   Poor  Judgement:  Impaired  Insight:  Lacking  Psychomotor Activity:  Decreased  Concentration:  Poor  Recall:  [place] of Knowledge:Fair  Language: Fair  Akathisia:  Negative  Handed:  Right  AIMS (if indicated):     Assets:  Financial Resources/Insurance Intimacy Leisure Time Resilience Social Support  ADL's:  Impaired  Cognition: Impaired,  Mild  Sleep:      Medical Decision Making: New problem, with additional work up planned, Review of Psycho-Social Stressors (1), Review or order clinical lab tests (1), Established Problem, Worsening (2), Review of Last Therapy Session (1), Review or order medicine tests (1), Review of Medication Regimen & Side Effects (2) and Review of New Medication or Change  in Dosage (2)  Treatment Plan Summary: Patient has symptoms of bipolar disorder and noncompliant with the treatment and currently unable to care for herself she needed inpatient psychiatric hospitalization. Patient does not appear to be catatonic at this time because she is able to talk and able to eat. Daily contact with patient to assess and evaluate symptoms and progress in treatment and Medication management  Plan: Case discussed with Dr. Carles Collet and recommended medication as below. Start lithium 300 mg twice daily and Zyprexa 5 mg at bedtime for mood swings Monitor for therapeutic level of lithium in five days. Discontinue Adderall - she is able communicate and eat her meals  Recommend psychiatric Inpatient admission when medically cleared. Supportive therapy provided about ongoing stressors.  Appreciate psychiatric consultation and follow up as clinically required Please contact [phone] or [phone] if needs further assistance  Disposition: Patient meets criteria for acute psychiatric hospitalization as she was not taking her medication and continue to deteriorate her mental status.  Madalin Hughart,JANARDHAHA R. 01/26/2015 1:40 PM

## 2015-01-26 NOTE — Progress Notes (Signed)
Speech Language Pathology Treatment: Dysphagia  Patient Details Name: Kellie Velez MRN: [id number] DOB: 1956/07/08 Today's Date: 01/26/2015 Time: 0321-2248 SLP Time Calculation (min) (ACUTE ONLY): 16 min  Assessment / Plan / Recommendation Clinical Impression  Pt seen for dysphagia therapy following MBS 6/10 with review of results and recommendations. Pt alert however tangential and  irrelevant statements. Teaspoon size amounts nectar thick liquid provided with suspicion of intermittent delayed swallow initiation. Educated clinical reasoning for teaspoon amount versus cup with liquids. No s/s aspiration present. She will continue to need full supervision due to confusion with Dys 1 diet and nectar thick liquids. ST will continue to follow.     HPI Other Pertinent Information: [name] brought to [facility] ED 5/25 with AMS likely due to hypernatremia as well as aspiration PNA.  Intubated 5/25-6/7.   PmHx bipolar disorder, CVA (dense left hemiplegia), CAD.  Has had acute encephalopathy with slow resolution; noted hallucinations and periods of catatonia. Palliative following.     Pertinent Vitals Pain Assessment: No/denies pain  SLP Plan  Continue with current plan of care    Recommendations Diet recommendations: Dysphagia 1 (puree);Nectar-thick liquid Liquids provided via: Teaspoon Medication Administration: Whole meds with puree Supervision: Patient able to self feed;Full supervision/cueing for compensatory strategies Compensations: Slow rate;Small sips/bites Postural Changes and/or Swallow Maneuvers: Seated upright 90 degrees              Oral Care Recommendations: Oral care BID Follow up Recommendations:  (TBD) Plan: Continue with current plan of care    GO     Royce Macadamia 01/26/2015, 11:30 AM   Breck Coons Lonell Face.Ed [employer] [phone]

## 2015-01-26 NOTE — Care Management Note (Signed)
Case Management Note  Patient Details  Name: Kellie Velez MRN: [id number] Date of Birth: [birth year]  Subjective/Objective:     Patient is for inpatient psych, psych CSW following.               Action/Plan:   Expected Discharge Date:                  Expected Discharge Plan:  Psychiatric Hospital  In-House Referral:  Clinical Social Work  Discharge planning Services  CM Consult  Post Acute Care Choice:    Choice offered to:     DME Arranged:    DME Agency:     HH Arranged:    HH Agency:     Status of Service:  In process, will continue to follow  Medicare Important Message Given:  Yes Date Medicare IM Given:  01/22/15 Medicare IM give by:  Avie Arenas, RNBSN Date Additional Medicare IM Given:  01/26/15 Additional Medicare Important Message give by:  Letha Cape RN  If discussed at Long Length of Stay Meetings, dates discussed:    Additional Comments:  Leone Haven, RN 01/26/2015, 2:50 PM

## 2015-01-27 ENCOUNTER — Inpatient Hospital Stay (HOSPITAL_COMMUNITY): Payer: Medicare Other

## 2015-01-27 LAB — CBC
HEMATOCRIT: 28.3 % — AB (ref 36.0–46.0)
Hemoglobin: 8.8 g/dL — ABNORMAL LOW (ref 12.0–15.0)
MCH: 27.9 pg (ref 26.0–34.0)
MCHC: 31.1 g/dL (ref 30.0–36.0)
MCV: 89.8 fL (ref 78.0–100.0)
Platelets: 136 10*3/uL — ABNORMAL LOW (ref 150–400)
RBC: 3.15 MIL/uL — AB (ref 3.87–5.11)
RDW: 19.6 % — ABNORMAL HIGH (ref 11.5–15.5)
WBC: 6.6 10*3/uL (ref 4.0–10.5)

## 2015-01-27 LAB — BASIC METABOLIC PANEL
Anion gap: 8 (ref 5–15)
BUN: 42 mg/dL — AB (ref 6–20)
CALCIUM: 11.8 mg/dL — AB (ref 8.9–10.3)
CO2: 21 mmol/L — ABNORMAL LOW (ref 22–32)
Chloride: 112 mmol/L — ABNORMAL HIGH (ref 101–111)
Creatinine, Ser: 1.79 mg/dL — ABNORMAL HIGH (ref 0.44–1.00)
GFR calc non Af Amer: 29 mL/min — ABNORMAL LOW (ref >60)
GFR, EST AFRICAN AMERICAN: 34 mL/min — AB (ref >60)
GLUCOSE: 82 mg/dL (ref 65–99)
Potassium: 4.2 mmol/L (ref 3.5–5.1)
Sodium: 141 mmol/L (ref 135–145)

## 2015-01-27 MED ORDER — ENSURE ENLIVE PO LIQD
237.0000 mL | Freq: Three times a day (TID) | ORAL | Status: DC
  Administered 2015-01-28 – 2015-01-29 (×4): 237 mL via ORAL

## 2015-01-27 MED ORDER — SODIUM CHLORIDE 0.9 % IV SOLN
INTRAVENOUS | Status: DC
  Administered 2015-01-27: 20:00:00 via INTRAVENOUS
  Filled 2015-01-27: qty 1000

## 2015-01-27 MED ORDER — LITHIUM CARBONATE 300 MG PO CAPS
300.0000 mg | ORAL_CAPSULE | Freq: Two times a day (BID) | ORAL | Status: DC
  Administered 2015-01-28 (×2): 300 mg via ORAL
  Filled 2015-01-27 (×3): qty 1

## 2015-01-27 MED ORDER — OLANZAPINE 5 MG PO TABS
5.0000 mg | ORAL_TABLET | Freq: Every day | ORAL | Status: DC
  Administered 2015-01-27 – 2015-01-28 (×2): 5 mg via ORAL
  Filled 2015-01-27 (×3): qty 1

## 2015-01-27 MED ORDER — ADULT MULTIVITAMIN W/MINERALS CH
1.0000 | ORAL_TABLET | Freq: Every day | ORAL | Status: DC
  Administered 2015-01-27 – 2015-01-29 (×3): 1 via ORAL
  Filled 2015-01-27 (×3): qty 1

## 2015-01-27 NOTE — Clinical Social Work Psych Note (Addendum)
Psych CSW continuing to follow patient and assist with discharge.  Disposition: Inpatient Psychiatric Hospitalization vs return to SNF (psych CSW attempting to obtain additional baseline collateral information from SNF unit RN)  1:02pm- Psych CSW left message for unit RN at [facility] as well as liaison to obtain baseline information.  Psych CSW currently awaiting a return call.  Vickii Penna, LCSW [phone]  Psychiatric & Orthopedics (5N 1-8) Clinical Social Worker

## 2015-01-27 NOTE — Progress Notes (Signed)
RN notified by Revonda Standard in Ultrasound that only able to do renal ultrasound on patient's right kidney. Patient became combative towards staff. Patient to be transported back to room.

## 2015-01-27 NOTE — Progress Notes (Signed)
Physical Therapy Treatment Patient Details Name: Kellie Velez MRN: [id number] DOB: September 22, 1955 Today's Date: 01/27/2015    History of Present Illness Pt is a 59 y/o female who presents to the [facility] on 5/25 with AMS likely due to hypernatremia and aspiration PNA. Pt was intubated 5/25-6/7. PMH includes PPM, L TKA, TIA/CVA.    PT Comments    Progressing slowly towards physical therapy goals. Focused on seated balance training today and therapeutic exercises. Remains severely deconditioned but willing and motivated to work with therapy. Disoriented to time, location of hospital, and situation. Reported "squeezing" type pain in chest at end of therapy session, HR 120 with SpO2 90%, improves after returning to supine in bed - RN notified. Patient will continue to benefit from skilled physical therapy services to further improve independence with functional mobility.   Follow Up Recommendations  SNF;Supervision/Assistance - 24 hour     Equipment Recommendations  None recommended by PT    Recommendations for Other Services       Precautions / Restrictions Precautions Precautions: Fall Restrictions Weight Bearing Restrictions: No    Mobility  Bed Mobility Overal bed mobility: Needs Assistance;+2 for physical assistance Bed Mobility: Rolling;Sidelying to Sit;Sit to Supine Rolling: Max assist;+2 for physical assistance Sidelying to sit: Max assist;+2 for physical assistance   Sit to supine: Max assist;+2 for physical assistance   General bed mobility comments: Pt initially required max assist to roll onto side, progressed to mod assist with cues for bed rail use at end of session. Able to assist minimally using rail and shoulder to help rise/lower to/from edge of bed. Cues for technique throughout. Max assist for truncal and LE support.  Transfers Overall transfer level: Needs assistance Equipment used: Ambulation equipment used Transfers: Sit to/from Stand Sit to Stand: Total  assist;+2 physical assistance;From elevated surface;+2 safety/equipment         General transfer comment: Pt able to hold head up for short period of time when standing with sara plus. Did not tolerate well and needed to sit <15 seconds.  Ambulation/Gait                 Stairs            Wheelchair Mobility    Modified Rankin (Stroke Patients Only)       Balance Overall balance assessment: Needs assistance Sitting-balance support: Single extremity supported Sitting balance-Leahy Scale: Poor Sitting balance - Comments: Pt able to sit in short intervals without physical assist however required min-mod assist intermittently due to posterior loss of balance. Practiced lateral lean onto elbows, weight shifting and reaching towards limits of stability, and upright posture. Follows majority of commands that are simple, sometimes needs repeated, slow to respond. Tolerated x 15 minutes EOB.                            Cognition Arousal/Alertness: Lethargic Behavior During Therapy: Flat affect Overall Cognitive Status: No family/caregiver present to determine baseline cognitive functioning Area of Impairment: Orientation;Following commands Orientation Level: Place;Time;Situation   Memory: Decreased short-term memory Following Commands: Follows one step commands inconsistently;Follows one step commands with increased time            Exercises General Exercises - Lower Extremity Ankle Circles/Pumps: PROM;Both;10 reps;Supine Long Arc Quad: AAROM;Strengthening;Both;10 reps;Seated Straight Leg Raises: AAROM;Strengthening;Both;5 reps;Supine    General Comments General comments (skin integrity, edema, etc.): Left on Lt side for pressure relief with pillows supporting back/hips, HOB elevated 30 degrees.  Pt reported chest pain while attempting to stand "squeezing" which improved per pts report upon returning to supine and resting. HR elevated to 120, mild SOB with  SpO2 90% - RN notified.      Pertinent Vitals/Pain Pain Assessment: Faces Faces Pain Scale: Hurts even more Pain Location: buttocks, all over, (chest at end of therapy session RN notified) Pain Descriptors / Indicators: Grimacing;Squeezing Pain Intervention(s): Monitored during session;Repositioned;Limited activity within patient's tolerance;Other (comment) (RN notified of chest pain)    Home Living                      Prior Function            PT Goals (current goals can now be found in the care plan section) Acute Rehab PT Goals PT Goal Formulation: Patient unable to participate in goal setting Time For Goal Achievement: 02/05/15 Potential to Achieve Goals: Good Progress towards PT goals: Progressing toward goals    Frequency  Min 2X/week    PT Plan Current plan remains appropriate    Co-evaluation             End of Session   Activity Tolerance: Patient limited by lethargy (Reported chest pain) Patient left: in bed;with call bell/phone within reach;with SCD's reapplied (PRAFOs applied)     Time: 1829-9371 PT Time Calculation (min) (ACUTE ONLY): 41 min  Charges:  $Therapeutic Exercise: 8-22 mins $Therapeutic Activity: 23-37 mins                    G Codes:      Berton Mount 2015/02/14, 12:03 PM Charlsie Merles, Mims [phone]

## 2015-01-27 NOTE — Progress Notes (Addendum)
PROGRESS NOTE  Kellie Velez NWG:[id number] DOB: 1955-09-08 DOA: 01/07/2015 PCP: [name], MD  Brief Narrative: [name] brought to [facility] ED 5/25 with AMS due to hypernatremia as well as aspiration PNA. In ED, intubated for GCS 3 and PCCM called for admission.  The pt has been hospitalized since December 2015; started with mental illness, then aspiration pneumonia and recurrent c.diff. During that time she has had only an intermittent ability to have conversations which are riddled with delusions and hallucinations. 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 Significant Events: 5/25 - admitted with AMS, probable aspiration PNA, (cutlures grew MSSA) multiple metabolic derangements 5/26 - pupils changed, to CT, fevers, line was placed 5/30 - Dr Kendrick Fries discussion with s-in law - > limited code blue, continue efforts to get her off the vent - C Diff positive (reportedly 3rd episode) 5/31 - EEG x 2 c/w encephalopathy. MRI not possible due to pacer. Followed 1 command with squeeze. Still not noticed to move lowers. RUE slightly stronger 2/5 compare to LUE 1/5. Also rhythmic movement of mouth noted.  6/1 - Per Paliative MD -- patient had abrupt decline in sept and since then hospitalzed mostly in psych units 6/3 - ENT consulted - mastoid effusions related to intubaiton - serial fu in several weeks 6/7 - extubated 6/10 - TRH assumed care   HPI/Subjective: The patient is alert and interactive today. She presently denies any complaints to include chest pain shortness breath fevers chills nausea vomiting or abdominal pain.  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 -difficult to separate  delirium/encephalopathy from her severe baseline psychiatric disorder - attempt to avoid sedating medications and follow her mental status - it appears at this time that her mental status has greatly improved and I suspect she is approaching her baseline -Patient was catatonic when she was in the ICU--improved after starting Adderall  Hypernatremia -Likely due to poor intake likely related to requirement for thickened liquids but cannot r/o a degree of nephrogenic Diabetes Insipidus from previous lithium use -switch to D5W--> improved -change to D51/2NS   AKI -Baseline creatinine 0.9-1.2  -Likely due to volume depletion from poor oral intake (and requirement for thickened liquids) as well as increased insensible water loss from the patient's sepsis  -continue IV -renal US as not much improvement -check FeNa before increasing IVF rate -I/O not very accurate  Hypercalcemia -The patient has been hypercalcemic throughout the hospitalization -Corrected calcium 14.5 on the day of admission -01/07/2015 PTH 61 -TSH--0.415 -Likely a component of hypercalcemia although immobility, but cannot rule out primary hyperparathyroidism -Check 25 vitamin D--25.3 -SPEP--neg M-spike -urine calcium/creatinine ratio-pending  Bipolar D/O with catatonia -Psychiatry to see in consultation per PCCM consultation -Family's reports that she did not do well with lithium -However, case was discussed with psychiatry 01/26/2015--> discontinue Adderall, restart lithium and Zyprexa -6/13--case discussed with Dr. 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 - remains on oral vancomycin and fidaxomicin - continue treatment and isolation -01/25/2015--was the last day for fidaxomicin and oral  vancomycin -Started florastor -still has Flexi-seal -WBC improved, no abdominal  pain  Hx of CVA(R-MCA), dense left sided hemiplegia -PT/OT -likely return to SNF -Restart aspirin 81 mg daily   acute pulm edema Resolved  Protein calorie malnutrition Cleared for D1 w/ nectar liquids per SLP - follow intake  Hyperglycemia - no hx of DM Resolved - discontinue CBG checks Check hemoglobin A1c--4.9 Hyperglycemia is likely stressed induced   Code Status: NO CPR - NO CARDIOVERSION (YES to intubation, vasoactive or antiarrhythmic drugs) Family Communication: no family present at time of exam Disposition Plan: stable for transfer to medical bed - begin PT/OT - awaiting psychiatric recommendations - ultimate disposition will be transfer to SNF  Consultants: PCCM Psychiatry Palliative care     Procedures/Studies: Ct Head Wo Contrast  01/14/2015   CLINICAL DATA:  59 year old female with continued encephalopathy. Intubated. Initial encounter.  EXAM: CT HEAD WITHOUT CONTRAST  TECHNIQUE: Contiguous axial images were obtained from the base of the skull through the vertex without intravenous contrast.  COMPARISON:  01/08/2015 and earlier.  FINDINGS: Partially visible endotracheal and oral enteric tubes. Stable left maxillary sinus mucous retention cyst. Left tympanic and mastoid opacification appears increased. Small volume layering fluid in the pharynx.  No acute osseous abnormality identified. No acute orbit or scalp soft tissue findings.  Calcified atherosclerosis at the skull base. Compared to 01/07/2015, abnormal right MCA territory hypodensity has not changed. This is a new finding compared to 2013. No associated mass effect is evident. No associated hemorrhage. No ventriculomegaly. Patchy hypodensity in the left MCA white matter also appears stable, and was partially present in 2013. Chronic left cerebellar infarct is unchanged since 2013. No suspicious intracranial vascular hyperdensity. No  acute intracranial hemorrhage identified.  IMPRESSION: 1. Subacute versus chronic right greater than left MCA territory ischemia appears unchanged since 01/07/2015. Some of the left MCA findings were present in 2013. No associated hemorrhage or mass effect. 2. Chronic left cerebellar infarct is stable since 2013. 3. Left mastoid and tympanic effusion likely secondary to intubation.   Electronically Signed   By: Odessa Fleming M.D.   On: 01/14/2015 14:56   Ct Head Wo Contrast  01/08/2015   CLINICAL DATA:  Unresponsive, pupil asymmetry, follow-up  EXAM: CT HEAD WITHOUT CONTRAST  TECHNIQUE: Contiguous axial images were obtained from the base of the skull through the vertex without intravenous contrast.  COMPARISON:  01/07/2015  FINDINGS: No evidence of parenchymal hemorrhage or extra-axial fluid collection. No mass lesion, mass effect, or midline shift.  No CT evidence of acute infarction.  Focal hypodensity within the right frontoparietal region (series 2/ image 21), favored to reflect encephalomalacic changes related to prior right MCA distribution infarct, likely chronic although possibly subacute. The appearance on the current CT is slightly unusual with apparent preservation of the overlying cortical rim, although this apparent difference may be simply technical/artifactual.  Subcortical white matter and periventricular small vessel ischemic changes. Mild cranial atherosclerosis.  Mild cortical atrophy.  No ventriculomegaly.  Partial opacification of the left maxillary sinus. Partial opacification of the bilateral mastoid air cells, left greater than right.  No evidence of calvarial fracture.  IMPRESSION: Suspected prior right MCA distribution infarct, favored to be chronic, less likely subacute.  Otherwise, no evidence of acute intracranial abnormality.   Electronically Signed   By: Charline Bills M.D.   On: 01/08/2015 12:16   Ct Head Wo Contrast  01/07/2015   CLINICAL DATA:  Altered mental status.  Code  sepsis.  EXAM: CT HEAD WITHOUT CONTRAST  TECHNIQUE: Contiguous axial images were obtained from the  base of the skull through the vertex without intravenous contrast.  COMPARISON:  Head CT scan 07/20/2012.  FINDINGS: Since prior examination, the patient has suffered a right MCA infarct which appears remote. There is chronic microvascular ischemic change. No evidence of acute abnormality including infarct, hemorrhage, mass lesion, mass effect, midline shift or abnormal extra-axial fluid collections identified. No hydrocephalus or pneumocephalus. Mucous retention cyst or polyp left maxillary sinus is noted.  IMPRESSION: No acute abnormality.  Remote right MCA infarct.  Chronic microvascular ischemic change.   Electronically Signed   By: Drusilla Kanner M.D.   On: 01/07/2015 12:29   Dg Chest Port 1 View  01/19/2015   CLINICAL DATA:  Shortness of breath.  EXAM: PORTABLE CHEST - 1 VIEW  COMPARISON:  01/18/2015.  FINDINGS: Patient rotated to the left. Endotracheal tube tip 1.8 cm above the carina. Right IJ line and NG tube in stable position. Mediastinum hilar structures are normal. Heart size stable. Cardiac pacer in stable position. Persistent bilateral lower lobe infiltrates and atelectatic changes are present. No interim change. No pleural effusion or pneumothorax.  IMPRESSION: 1. Lines and tubes in stable position. 2. Persistent bilateral lower lobe infiltrates and atelectasis. No interim change.   Electronically Signed   By: Maisie Fus  Register   On: 01/19/2015 07:09   Dg Chest Port 1 View  01/18/2015   CLINICAL DATA:  Acute respiratory failure. On ventilator.  Hypoxia  EXAM: PORTABLE CHEST - 1 VIEW  COMPARISON:  01/17/2015  FINDINGS: Patient is rotated to the left on today's exam. Support lines and tubes in appropriate position. Bilateral lower lobe airspace disease is again seen, left side greater than right, without significant change. No definite pneumothorax visualized.  IMPRESSION: Left greater than right  lower lobe airspace disease, without significant change.   Electronically Signed   By: Myles Rosenthal M.D.   On: 01/18/2015 09:09   Dg Chest Port 1 View  01/17/2015   CLINICAL DATA:  Endotracheal tube present.  EXAM: PORTABLE CHEST - 1 VIEW  COMPARISON:  One-view chest 01/16/2015  FINDINGS: The heart size is normal. A left pleural effusion and basilar airspace disease are again noted. The endotracheal tube terminates 3 cm above the carina. Pacing wires are stable. The NG tube courses off the inferior border the film. A right IJ line terminates at cavoatrial junction. Mild edema is present.  IMPRESSION: 1. No significant change in mild edema, left pleural effusion and basilar airspace disease likely atelectasis. 2. Stable and satisfactory positioning of the support apparatus.   Electronically Signed   By: Marin Roberts M.D.   On: 01/17/2015 07:33   Dg Chest Port 1 View  01/16/2015   CLINICAL DATA:  Hypoxia  EXAM: PORTABLE CHEST - 1 VIEW  COMPARISON:  Jan 12, 2015  FINDINGS: Endotracheal tube tip is 4.7 cm above the carina. Central catheter tip is essentially at the cavoatrial junction. Nasogastric tube tip and side port are below the diaphragm. No pneumothorax. There is persistent airspace consolidation on the left with minimal left effusion. Right lung is clear. Heart size and pulmonary vascularity are normal. No adenopathy. Pacemaker leads are attached to the right atrium and right ventricle.  IMPRESSION: Tube and catheter positions as described without pneumothorax. Persistent left lower lobe consolidation with small left effusion. No new opacity.   Electronically Signed   By: Bretta Bang III M.D.   On: 01/16/2015 07:14   Dg Chest Port 1 View  01/12/2015   CLINICAL DATA:  Hypoxia with acute  respiratory failure  EXAM: PORTABLE CHEST - 1 VIEW  COMPARISON:  01/11/2015  FINDINGS: Cardiac shadow is stable. A pacing device, endotracheal tube, nasogastric catheter and right central venous line are again  seen and stable. Bibasilar densities are seen slightly increased on the left when compared with the with the previous day.  IMPRESSION: Tubes and lines as described.  Bibasilar changes with slight increase on the left when compared with the previous day.   Electronically Signed   By: Alcide Clever M.D.   On: 01/12/2015 07:35   Dg Chest Port 1 View  01/11/2015   CLINICAL DATA:  Acute respiratory failure  EXAM: PORTABLE CHEST - 1 VIEW  COMPARISON:  01/10/2015  FINDINGS: Bibasilar airspace opacities are stable. There are no new lung abnormalities.  No pneumothorax.  Probable small effusions.  Endotracheal tube tip is not well defined on this exam, but appears to project approximate 5 mm above the carina.  Orogastric tube and right internal jugular central venous line are stable and well positioned.  IMPRESSION: 1. No change from the previous day's study. 2. Persistent basilar airspace opacity which may reflect pneumonia, atelectasis or a combination. No evidence of pulmonary edema. 3. Support apparatus stable position.   Electronically Signed   By: Amie Portland M.D.   On: 01/11/2015 07:13   Dg Chest Port 1 View  01/10/2015   CLINICAL DATA:  Acute respiratory failure.  EXAM: PORTABLE CHEST - 1 VIEW  COMPARISON:  01/09/2015  FINDINGS: Nasogastric tube tip is in the stomach. The right IJ catheter tip is in the projection of the cavoatrial junction. Left chest wall ICD is noted with leads in the right atrial appendage and right ventricle. No significant change in bilateral lower lobe airspace opacities.  IMPRESSION: 1. No change in bilateral lower lobe airspace opacities. 2. Stable support apparatus.   Electronically Signed   By: Signa Kell M.D.   On: 01/10/2015 09:20   Dg Chest Port 1 View  01/09/2015   CLINICAL DATA:  Pneumonia.  EXAM: PORTABLE CHEST - 1 VIEW  COMPARISON:  01/08/2015.  FINDINGS: Endotracheal tube, NG tube, right IJ line in stable position. Mediastinum hilar structures are stable. Cardiac  pacer lead tips in right atrium right ventricle. Heart size stable. The persistent unchanged prominent bibasilar pulmonary infiltrates consistent with pneumonia. No pleural effusion or pneumothorax.  IMPRESSION: 1.  Lines and tubes in stable position.  2. Persistent prominent bilateral lower lobe pulmonary infiltrates consistent with pneumonia.   Electronically Signed   By: Maisie Fus  Register   On: 01/09/2015 07:29   Dg Chest Port 1 View  01/08/2015   CLINICAL DATA:  Respiratory failure, sepsis, current tobacco use  EXAM: PORTABLE CHEST - 1 VIEW  COMPARISON:  Portable chest x-ray of Jan 07, 2015  FINDINGS: The lungs are adequately inflated. There is persistent interstitial and alveolar opacity in the right lower lobe. Persistent increased retrocardiac density is demonstrated but slight improved aeration present here. The cardiac silhouette is normal in size. The pulmonary vascularity is not engorged. The endotracheal tube tip lies 2.8 cm above the carina. The esophagogastric tube tip projects below the inferior margin of the image. The right internal jugular venous catheter tip projects over the midportion of the SVC. The permanent pacemaker is in reasonable position. The bony thorax exhibits no acute abnormalities.  IMPRESSION: Persistent bibasilar atelectasis and/or pneumonia. Slight interval improvement in the left retrocardiac region has occurred. The support tubes and lines are in reasonable position.   Electronically Signed  By: Allyce Bochicchio  [place] M.D.   On: 01/08/2015 07:54   Dg Chest Port 1 View  01/07/2015   CLINICAL DATA:  Central line placement.  EXAM: PORTABLE CHEST - 1 VIEW  COMPARISON:  Same day.  FINDINGS: Stable cardiomediastinal silhouette. Endotracheal and nasogastric tubes are in grossly good position. Left-sided pacemaker is unchanged in position. Interval placement of right internal jugular catheter line is noted with distal tip in expected position of the cavoatrial junction. No pneumothorax is  noted. Left lung is clear. Right basilar opacity is again noted concerning for pneumonia or atelectasis. No significant pleural effusion is noted.  IMPRESSION: Interval placement of right internal jugular catheter is noted with distal tip in expected position of cavoatrial junction. No pneumothorax is noted. Stable right basilar opacity is noted concerning for pneumonia or atelectasis.   Electronically Signed   By: Lupita Raider, M.D.   On: 01/07/2015 19:01   Dg Chest Port 1 View  01/07/2015   CLINICAL DATA:  Respiratory failure endotracheal tube placement  EXAM: PORTABLE CHEST - 1 VIEW  COMPARISON:  08/04/2014  FINDINGS: Cardiomegaly again noted. Dual lead cardiac pacemaker is unchanged in position. Hazy right infrahilar atelectasis or infiltrate. There is NG tube in place. Endotracheal tube with tip at the level of carina towards right main bronchus. Endotracheal tube should be retracted about 1.5 cm. There is no pneumothorax.  IMPRESSION: Dual lead cardiac pacemaker is unchanged in position. Hazy right infrahilar atelectasis or infiltrate. There is NG tube in place. Endotracheal tube with tip at the level of carina towards right main bronchus. Endotracheal tube should be retracted about 1.5 cm. There is no pneumothorax.  These results were called by telephone at the time of interpretation on 01/07/2015 at 11:53 am to Dr. Geoffery Lyons , who verbally acknowledged these results.   Electronically Signed   By: Natasha Mead M.D.   On: 01/07/2015 11:53   Dg Abd Portable 1v  01/20/2015   CLINICAL DATA:  Evaluate NG tube placement  EXAM: PORTABLE ABDOMEN - 1 VIEW  COMPARISON:  01/10/2015  FINDINGS: NG huge it is in the peripyloric region with the side port in the antrum. Prior cholecystectomy. Nonobstructive bowel gas pattern.  IMPRESSION: NG tube tip in the peripyloric region.   Electronically Signed   By: Charlett Nose M.D.   On: 01/20/2015 17:15   Dg Abd Portable 1v  01/10/2015   CLINICAL DATA:  Orogastric tube  placement.  EXAM: PORTABLE ABDOMEN - 1 VIEW  COMPARISON:  Chest x-ray today.  FINDINGS: Enteric tube is present which has tip right of midline in the upper abdomen likely over the distal stomach/ gastroduodenal junction. Bowel gas pattern is nonobstructive. IVC filter is present with tip just left of midline at the L2 level as patient is slightly rotated to the left. There are mild degenerative changes of the spine. There are several pelvic phleboliths. A small caliber rectal catheter is present.  IMPRESSION: Nonobstructive bowel gas pattern.  Enteric tube with tip right of midline over the upper abdomen likely over the distal stomach or gastroduodenal junction.   Electronically Signed   By: Elberta Fortis M.D.   On: 01/10/2015 12:12   Dg Swallowing Func-speech Pathology  01/23/2015    Objective Swallowing Evaluation:    Patient Details  Name: Kellie Velez MRN: [id number] Date of Birth: 27-Mar-1956  Today's Date: 01/23/2015 Time: SLP Start Time (ACUTE ONLY): 1010-SLP Stop Time (ACUTE ONLY): 1030 SLP Time Calculation (min) (ACUTE ONLY): 20 min  Past Medical History:  Past Medical History  Diagnosis Date  . Arthritis   . Hypertension   . Nerve damage   . Osteoporosis   . Hearing loss   . Abdominal pain   . Stroke     TIA's  . Bradycardia, sinus, persistent, severe   . 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 Bipolar 1 disorder   . Heart murmur 08/13/2012    EF 60-65%,severe LVH,Mild AOV regurg, MR,trivial TR  . Mild carotid artery disease 05/05/2005,08/13/2012    left ICA 0-49% diameter reduction 2006; bilateral fibrous plaque mild  2013  . LVH (left ventricular hypertrophy)    Past Surgical History:  Past Surgical History  Procedure Laterality Date  . Cholecystectomy  1980  . Appendectomy  1983  . Total knee arthroplasty  2003    left  . Permanent pacemaker insertion  01/01/2005    Medtronic EnRhythm  . Permanent pacemaker generator change  06/08/2011     Medtronic Adapta  . Tee without cardioversion  01/06/2005    no cardiac source of embolus   HPI:  Other Pertinent Information: [name] brought to [facility] ED 5/25 with AMS  likely due to hypernatremia as well as aspiration PNA.  Intubated  5/25-6/7.   PmHx bipolar disorder, CVA (dense left hemiplegia), CAD.  Has  had acute encephalopathy with slow resolution; noted hallucinations and  periods of catatonia. Palliative following.    No Data Recorded  Assessment / Plan / Recommendation CHL IP CLINICAL IMPRESSIONS 01/23/2015  Therapy Diagnosis (None)  Clinical Impression Pt demonstrates moderate oral and oropharyngeal  dysphagia with suspected baseline deficits exacerbated by weakness and  decreased laryngeal sensation following prolong intubation. Oral phase is  prolonged with pumping and unnecessary mastication of liquids. Mastication  of solids is laborious due to minimal dentition. Pt holds bolus orally for  2-5 seconds prior to transit to oropharynx. Large boluses reach the  pyriform sinuses with occasional silent penetration before the swallow.  Limiting nectar to teaspoon sips eliminated penetration. Honey thick  liquids were also tolerated well though there was more oral and base of  tongue residual with honey and solids. Recommend pt initiate Dys 1 (puree)  with nectar via teaspoon. Hopeful for improvement when pt able to phonate  and cough/clear throat more effectively to sense and remove penetrate.  Will f/u for tolerance.       CHL IP TREATMENT RECOMMENDATION 01/23/2015  Treatment Recommendations Therapy as outlined in treatment plan below     CHL IP DIET RECOMMENDATION 01/23/2015  SLP Diet Recommendations Dysphagia 1 (Puree);Nectar  Liquid Administration via (None)  Medication Administration Whole meds with puree  Compensations Slow rate;Small sips/bites  Postural Changes and/or Swallow Maneuvers (None)     CHL IP OTHER RECOMMENDATIONS 01/23/2015  Recommended Consults (None)  Oral Care Recommendations Oral care BID   Other Recommendations Order thickener from pharmacy     No flowsheet data found.   CHL IP FREQUENCY AND DURATION 01/23/2015  Speech Therapy Frequency (ACUTE ONLY) min 2x/week  Treatment Duration 2 weeks     Pertinent Vitals/Pain NA    SLP Swallow Goals No flowsheet data found.  No flowsheet data found.    CHL IP REASON FOR REFERRAL 01/23/2015  Reason for Referral Objectively evaluate swallowing function     CHL IP ORAL PHASE 01/23/2015  Lips (None)  Tongue (None)  Mucous membranes (None)  Nutritional status (None)  Other (None)  Oxygen therapy (None)  Oral Phase Impaired  Oral - Pudding Teaspoon (None)  Oral - Pudding Cup (None)  Oral - Honey Teaspoon (None)  Oral - Honey Cup (None)  Oral - Honey Syringe (None)  Oral - Nectar Teaspoon (None)  Oral - Nectar Cup (None)  Oral - Nectar Straw (None)  Oral - Nectar Syringe (None)  Oral - Ice Chips (None)  Oral - Thin Teaspoon (None)  Oral - Thin Cup (None)  Oral - Thin Straw (None)  Oral - Thin Syringe (None)  Oral - Puree (None)  Oral - Mechanical Soft (None)  Oral - Regular (None)  Oral - Multi-consistency (None)  Oral - Pill (None)  Oral Phase - Comment (None)      CHL IP PHARYNGEAL PHASE 01/23/2015  Pharyngeal Phase Impaired  Pharyngeal - Pudding Teaspoon (None)  Penetration/Aspiration details (pudding teaspoon) (None)  Pharyngeal - Pudding Cup (None)  Penetration/Aspiration details (pudding cup) (None)  Pharyngeal - Honey Teaspoon (None)  Penetration/Aspiration details (honey teaspoon) (None)  Pharyngeal - Honey Cup (None)  Penetration/Aspiration details (honey cup) (None)  Pharyngeal - Honey Syringe (None)  Penetration/Aspiration details (honey syringe) (None)  Pharyngeal - Nectar Teaspoon (None)  Penetration/Aspiration details (nectar teaspoon) (None)  Pharyngeal - Nectar Cup (None)  Penetration/Aspiration details (nectar cup) (None)  Pharyngeal - Nectar Straw (None)  Penetration/Aspiration details (nectar straw) (None)  Pharyngeal - Nectar Syringe (None)   Penetration/Aspiration details (nectar syringe) (None)  Pharyngeal - Ice Chips (None)  Penetration/Aspiration details (ice chips) (None)  Pharyngeal - Thin Teaspoon (None)  Penetration/Aspiration details (thin teaspoon) (None)  Pharyngeal - Thin Cup (None)  Penetration/Aspiration details (thin cup) (None)  Pharyngeal - Thin Straw (None)  Penetration/Aspiration details (thin straw) (None)  Pharyngeal - Thin Syringe (None)  Penetration/Aspiration details (thin syringe') (None)  Pharyngeal - Puree (None)  Penetration/Aspiration details (puree) (None)  Pharyngeal - Mechanical Soft (None)  Penetration/Aspiration details (mechanical soft) (None)  Pharyngeal - Regular (None)  Penetration/Aspiration details (regular) (None)  Pharyngeal - Multi-consistency (None)  Penetration/Aspiration details (multi-consistency) (None)  Pharyngeal - Pill (None)  Penetration/Aspiration details (pill) (None)  Pharyngeal Comment (None)      No flowsheet data found.  No flowsheet data found.        Harlon Ditty, MA CCC-SLP [phone]  Claudine Mouton 01/23/2015, 1:32 PM            Objective: Filed Vitals:   01/26/15 2051 01/27/15 0626 01/27/15 0631 01/27/15 1509  BP: 104/66  105/54 98/57  Pulse: 79  69 78  Temp: 97.7 F (36.5 C)  97.9 F (36.6 C) 97.7 F (36.5 C)  TempSrc: Oral   Oral  Resp: 18  18 24   Height:      Weight:  77.8 kg (171 lb 8.3 oz)    SpO2: 99%  100% 98%    Intake/Output Summary (Last 24 hours) at 01/27/15 1933 Last data filed at 01/27/15 1849  Gross per 24 hour  Intake   1755 ml  Output      0 ml  Net   1755 ml   Weight change: -0.6 kg (-1 lb 5.2 oz) Exam:   General:  Pt is alert, intermittently follows commands appropriately, not in acute distress  HEENT: No icterus, No thrush,  [place]/AT  Cardiovascular: RRR, S1/S2, no rubs, no gallops  Respiratory: Bibasilar crackles. No wheeze. Good air movement.   Abdomen: Soft/+BS, non tender, non distended, no guarding  Extremities: No  edema, No lymphangitis, No petechiae, No rashes, no synovitis  Data Reviewed: Basic Metabolic Panel:  Recent Labs Lab 01/21/15 0430  01/23/15 0222 01/24/15 0300 01/25/15 0559 01/26/15 0523 01/27/15 0648  NA 146*  < > 147* 151* 153* 145 141  K 4.4  < > 4.5 5.1 4.7 4.9 4.2  CL 110  < > 111 116* 117* 115* 112*  CO2 28  < > 24 23 22 22  21*  GLUCOSE 101*  < > 85 105* 96 81 82  BUN 67*  < > 56* 55* 53* 49* 42*  CREATININE 1.17*  < > 1.29* 1.58* 1.79* 1.73* 1.79*  CALCIUM 11.8*  < > 11.8* 12.7* 12.8* 11.7* 11.8*  MG 2.4  --   --   --   --   --   --   PHOS 5.7*  --   --   --   --   --   --   < > = values in this interval not displayed. Liver Function Tests:  Recent Labs Lab 01/24/15 0300 01/25/15 0559  AST 27 15  ALT 16 13*  ALKPHOS 63 58  BILITOT 0.7 0.4  PROT 6.9 6.6  ALBUMIN 2.9* 2.9*   No results for input(s): LIPASE, AMYLASE in the last 168 hours. No results for input(s): AMMONIA in the last 168 hours. CBC:  Recent Labs Lab 01/21/15 0430 01/24/15 0300 01/27/15 0648  WBC 8.4 7.8 6.6  NEUTROABS 6.1  --   --   HGB 8.0* 10.0* 8.8*  HCT 26.9* 33.3* 28.3*  MCV 91.8 91.0 89.8  PLT 152 178 136*   Cardiac Enzymes: No results for input(s): CKTOTAL, CKMB, CKMBINDEX, TROPONINI in the last 168 hours. BNP: Invalid input(s): POCBNP CBG:  Recent Labs Lab 01/24/15 0842 01/24/15 1129 01/24/15 1504 01/24/15 1935 01/25/15 0915  GLUCAP 102* 141* 140* 129* 81    No results found for this or any previous visit (from the past 240 hour(s)).   Scheduled Meds: . antiseptic oral rinse  7 mL Mouth Rinse QID  . aspirin  81 mg Oral Daily  . chlorhexidine  15 mL Mouth Rinse BID  . famotidine  20 mg Oral BID  . feeding supplement (ENSURE ENLIVE)  237 mL Oral TID BM  . heparin  5,000 Units Subcutaneous 3 times per day  . multivitamin with minerals  1 tablet Oral Daily  . saccharomyces boulardii  250 mg Oral BID   Continuous Infusions: . dextrose 75 mL/hr at 01/27/15 0612       Shaunak Kreis, DO  [facility] Pager [phone]  If 7PM-7AM, please contact night-coverage [email or web address] Password [facility] 01/27/2015, 7:33 PM   LOS: 20 days

## 2015-01-27 NOTE — Progress Notes (Signed)
Nutrition Follow-up  DOCUMENTATION CODES:  Not applicable  INTERVENTION:  -Ensure Enlive po TID, each supplement provides 350 kcal and 20 grams of protein -MVI  NUTRITION DIAGNOSIS:  Inadequate oral intake related to dysphagia as evidenced by meal completion < 50%.  Ongoing  GOAL:  Patient will meet greater than or equal to 90% of their needs  Unmet  MONITOR:  PO intake, Supplement acceptance, Labs, Weight trends, Skin, I & O's  REASON FOR ASSESSMENT:  Consult Enteral/tube feeding initiation and management  ASSESSMENT: Patient was brought to [facility] ED 5/25 with AMS likely due to hypernatremia as well as aspiration PNA.  Pt transferred out of ICU. Palliative and psych care following.   TF has been d/c. SLP evaluated and pt has been advanced to a dysphagia 1 diet with nectar thick liquids. PO: 25-50%. Suspect decreased PO intake due to modified diet. RD will supplement diet given poor po intake and increased needs due to wound healing.   Pt is on enteric precautions; rectal tube in place.   CSW following. Plan is for inpatient pysch placement once medically stable.   Height:  Ht Readings from Last 1 Encounters:  01/24/15 5\' 5"  (1.651 m)    Weight:  Wt Readings from Last 1 Encounters:  01/27/15 171 lb 8.3 oz (77.8 kg)    Ideal Body Weight:  66 kg  Wt Readings from Last 10 Encounters:  01/27/15 171 lb 8.3 oz (77.8 kg)  07/21/14 228 lb 9 oz (103.675 kg)  07/11/14 230 lb (104.327 kg)  12/31/13 250 lb (113.399 kg)  12/15/12 228 lb 2 oz (103.477 kg)  05/20/11 238 lb 12.8 oz (108.319 kg)    BMI:  Body mass index is 28.54 kg/(m^2).  Estimated Nutritional Needs:  Kcal:  1900-2100  Protein:  95-105 grams  Fluid:  1.9-2.1 L  Skin:  Wound (see comment) (Cyanosis, MSAD groin, Stage II pressure ulcer on sacrum)  Diet Order:  DIET - DYS 1 Room service appropriate?: No; Fluid consistency:: Nectar Thick  EDUCATION NEEDS:  No education needs identified at  this time   Intake/Output Summary (Last 24 hours) at 01/27/15 1637 Last data filed at 01/27/15 1402  Gross per 24 hour  Intake   1555 ml  Output      0 ml  Net   1555 ml    Last BM:  01/27/15  Lauri Till A. Mayford Knife, RD, LDN, CDE Pager: [phone] After hours Pager: [phone]

## 2015-01-27 NOTE — Consult Note (Signed)
Psychiatry Consult Follow Up  Reason for Consult:  Bipolar with catatonia and AMS Referring Physician:  Dr. Carles Collet Patient Identification: Kellie Velez MRN:  [id number] Principal Diagnosis: Bipolar I disorder with catatonia Diagnosis:   Patient Active Problem List   Diagnosis Date Noted  . Hypercalcemia [E83.52] 01/25/2015  . Dysphagia [R13.10] 01/25/2015  . Bipolar I disorder with catatonia [F31.9] 01/23/2015  . Colitis, Clostridium difficile [A04.7]   . Palliative care encounter [Z51.5]   . Encephalopathy [G93.40]   . Acute pulmonary edema [J81.0]   . Acute respiratory failure with hypoxia [J96.01]   . Hypernatremia [E87.0]   . MSSA (methicillin susceptible Staphylococcus aureus) pneumonia [J15.211]   . Encephalopathy acute [G93.40]   . Pressure ulcer [L89.90] 01/09/2015  . ARF (acute respiratory failure) [J96.00]   . Pneumonia [J18.9]   . Pupil asymmetry [H57.02]   . Altered mental status [R41.82] 01/07/2015  . Respiratory failure [J96.90]   . Blood poisoning [A41.9]   . Bipolar disorder, current episode mixed, severe, with psychotic features [F31.64]   . Psychoses [F29]   . Pacemaker [Z95.0] 12/31/2013  . Unspecified essential hypertension [I10] 09/28/2012  . Bipolar disorder, unspecified [F31.9] 09/28/2012  . TIA (transient ischemic attack) [G45.9] 09/28/2012  . Mild carotid artery disease [I77.9] 09/28/2012  . Osteoporosis, unspecified [M81.0] 09/28/2012  . Bradycardia [R00.1] 09/28/2012  . Heart murmur [R01.1] 09/28/2012  . Hearing loss [H91.90] 09/28/2012  . Arthritis [M19.90] 09/28/2012  . LVH (left ventricular hypertrophy) [I51.7] 09/28/2012    Total Time spent with patient: 30 minutes  Subjective:   Kellie Velez is a 59 y.o. female patient admitted with AMS.  HPI: Kellie Velez is an 59 y.o. Woman seen face-to-face for psychiatric consultation and evaluation of bipolar disorder with catatonia. Patient is a poor historian unable to give me details about her  previous treatment, medications and safety concerns. Patient appeared sitting in her bed, awake, alert, oriented and able to eat her soft food with the help of staff RN today. Patient has a history of bipolar disorder and schizoaffective disorder and was at [facility] 07/21/14-07/29/14 due to psychotic symptoms. Patient has tangential and circumstantial thinking process. Review of her medical records indicated patient presented previously with significant symptoms of mania and depression. Reportedly patient is also noncompliant with her medication management which may be reason for relapse of her symptoms.  Interval history: Patient is seen today for psychiatric consultation follow-up. Appreciate Dr. Reynaldo Minium communicating with me regarding this patient and also case discussed with the psychiatric social service Nonnie Done today. Reportedly patient medications were reintroduced but patient does not show clinical improvement at this time. Patient appeared lying down in her bed and seems to be under sedation not able to verbally participate in treatment. Reportedly patient was hospitalized in [facility] about 25-6 months also received ECT treatment before she was placed in [facility] for long-term care. Patient medication was discontinued secondary to aspiration pneumonia and admission. Patient continued to have tangential and circumstantial thought process as per staff report. She will be reassessed tomorrow for appropriate disposition plan.   Past Medical History:  Past Medical History  Diagnosis Date  . Arthritis   . Hypertension   . Nerve damage   . Osteoporosis   . Hearing loss   . Abdominal pain   . Stroke     TIA's  . Bradycardia, sinus, persistent, severe   . 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  Bipolar 1 disorder   . Heart murmur 08/13/2012    EF 60-65%,severe LVH,Mild AOV regurg, MR,trivial TR   . Mild carotid artery disease 05/05/2005,08/13/2012    left ICA 0-49% diameter reduction 2006; bilateral fibrous plaque mild 2013  . LVH (left ventricular hypertrophy)     Past Surgical History  Procedure Laterality Date  . Cholecystectomy  1980  . Appendectomy  1983  . Total knee arthroplasty  2003    left  . Permanent pacemaker insertion  01/01/2005    Medtronic EnRhythm  . Permanent pacemaker generator change  06/08/2011    Medtronic Adapta  . Tee without cardioversion  01/06/2005    no cardiac source of embolus   Family History: History reviewed. No pertinent family history. Social History:  History  Alcohol Use No     History  Drug Use No    History   Social History  . Marital Status: Married    Spouse Name: N/A  . Number of Children: N/A  . Years of Education: N/A   Social History Main Topics  . Smoking status: Current Every Day Smoker -- 0.50 packs/day    Types: Cigarettes  . Smokeless tobacco: Not on file  . Alcohol Use: No  . Drug Use: No  . Sexual Activity: Not on file   Other Topics Concern  . None   Social History Narrative   Additional Social History:                          Allergies:   Allergies  Allergen Reactions  . Floxin [Ofloxacin] Nausea And Vomiting and Rash  . Plavix [Clopidogrel Bisulfate]     "stomach pain"  . Epinephrine Other (See Comments)    "makes her feel excited"    Labs:  Results for orders placed or performed during the hospital encounter of 01/07/15 (from the past 48 hour(s))  Vit D  25 hydroxy (rtn osteoporosis monitoring)     Status: Abnormal   Collection Time: 01/25/15 11:44 AM  Result Value Ref Range   Vit D, 25-Hydroxy 25.3 (L) 30.0 - 100.0 ng/mL    Comment: (NOTE) Vitamin D deficiency has been defined by the [place] practice guideline as a level of serum 25-OH vitamin D less than 20 ng/mL (1,2). The Endocrine Society went on to further define vitamin  D insufficiency as a level between 21 and 29 ng/mL (2). 1. IOM (Institute of Medicine). 2010. Dietary reference   intakes for calcium and D. [place]: The   [employer]. 2. Holick MF, [place], Bischoff-Ferrari HA, et al.   Evaluation, treatment, and prevention of vitamin D   deficiency: an Endocrine Society clinical practice   guideline. JCEM. 2011 Jul; 96(7):1911-30. Performed At: [facility] [place], [place] [id number] Lindon Romp MD [id number]   Protein electrophoresis, serum     Status: Abnormal   Collection Time: 01/25/15 11:44 AM  Result Value Ref Range   Total Protein ELP 5.9 (L) 6.0 - 8.5 g/dL   Albumin ELP 2.8 (L) 2.9 - 4.4 g/dL    Comment:               **Please note reference interval change**   Alpha-1-Globulin 0.3 0.1 - 0.4 g/dL    Comment:               **Please note reference interval change**   Alpha-2-Globulin 0.9 0.4 - 1.2 g/dL  Comment:               **Please note reference interval change**   Beta Globulin 1.0 0.6 - 1.3 g/dL    Comment:               **Please note reference interval change**   Gamma Globulin 0.9 0.5 - 1.6 g/dL    Comment:               **Please note reference interval change**   M-Spike, % Not Observed Not Observed g/dL   SPE Interp. Comment     Comment: (NOTE) The SPE pattern reflects hypoalbuminemia. Evidence of monoclonal protein is not apparent. Performed At: [facility] [place], [place] [id number] Lindon Romp MD [id number]    Comment Comment     Comment: (NOTE) Protein electrophoresis scan will follow via computer, mail, or courier delivery.    GLOBULIN, TOTAL 3.1 2.0 - 4.5 g/dL    Comment:               **Please note reference interval change**   A/G Ratio 0.9 0.7 - 2.0    Comment:               **Please note reference interval change**  Hemoglobin A1c     Status: None   Collection Time: 01/25/15 11:44 AM  Result Value Ref Range   Hgb A1c  MFr Bld 4.9 4.8 - 5.6 %    Comment: (NOTE)         Pre-diabetes: 5.7 - 6.4         Diabetes: >6.4         Glycemic control for adults with diabetes: <7.0    Mean Plasma Glucose 94 mg/dL    Comment: (NOTE) Performed At: [facility] [place], [place] [id number] Lindon Romp MD [id number]   Basic metabolic panel     Status: Abnormal   Collection Time: 01/26/15  5:23 AM  Result Value Ref Range   Sodium 145 135 - 145 mmol/L   Potassium 4.9 3.5 - 5.1 mmol/L    Comment: SPECIMEN HEMOLYZED. HEMOLYSIS MAY AFFECT INTEGRITY OF RESULTS.   Chloride 115 (H) 101 - 111 mmol/L   CO2 22 22 - 32 mmol/L   Glucose, Bld 81 65 - 99 mg/dL   BUN 49 (H) 6 - 20 mg/dL   Creatinine, Ser 1.73 (H) 0.44 - 1.00 mg/dL   Calcium 11.7 (H) 8.9 - 10.3 mg/dL   GFR calc non Af Amer 31 (L) >60 mL/min   GFR calc Af Amer 36 (L) >60 mL/min    Comment: (NOTE) The eGFR has been calculated using the CKD EPI equation. This calculation has not been validated in all clinical situations. eGFR's persistently <60 mL/min signify possible Chronic Kidney Disease.    Anion gap 8 5 - 15  Basic metabolic panel     Status: Abnormal   Collection Time: 01/27/15  6:48 AM  Result Value Ref Range   Sodium 141 135 - 145 mmol/L   Potassium 4.2 3.5 - 5.1 mmol/L   Chloride 112 (H) 101 - 111 mmol/L   CO2 21 (L) 22 - 32 mmol/L   Glucose, Bld 82 65 - 99 mg/dL   BUN 42 (H) 6 - 20 mg/dL   Creatinine, Ser 1.79 (H) 0.44 - 1.00 mg/dL   Calcium 11.8 (H) 8.9 - 10.3 mg/dL   GFR calc non Af Amer 29 (L) >60 mL/min   GFR  calc Af Amer 34 (L) >60 mL/min    Comment: (NOTE) The eGFR has been calculated using the CKD EPI equation. This calculation has not been validated in all clinical situations. eGFR's persistently <60 mL/min signify possible Chronic Kidney Disease.    Anion gap 8 5 - 15  CBC     Status: Abnormal   Collection Time: 01/27/15  6:48 AM  Result Value Ref Range   WBC 6.6 4.0 - 10.5 K/uL   RBC 3.15 (L)  3.87 - 5.11 MIL/uL   Hemoglobin 8.8 (L) 12.0 - 15.0 g/dL   HCT 28.3 (L) 36.0 - 46.0 %   MCV 89.8 78.0 - 100.0 fL   MCH 27.9 26.0 - 34.0 pg   MCHC 31.1 30.0 - 36.0 g/dL   RDW 19.6 (H) 11.5 - 15.5 %   Platelets 136 (L) 150 - 400 K/uL    Vitals: Blood pressure 105/54, pulse 69, temperature 97.9 F (36.6 C), temperature source Oral, resp. rate 18, height _0  (1.651 m), weight 77.8 kg (171 lb 8.3 oz), SpO2 100 %.  Risk to Self: Is patient at risk for suicide?: No Risk to Others:   Prior Inpatient Therapy:   Prior Outpatient Therapy:    Current Facility-Administered Medications  Medication Dose Route Frequency Provider Last Rate Last Dose  . acetaminophen (TYLENOL) suppository 650 mg  650 mg Rectal Q6H PRN Cherene Altes, MD      . acetaminophen (TYLENOL) tablet 650 mg  650 mg Oral Q6H PRN Cherene Altes, MD   650 mg at 01/24/15 0923  . amphetamine-dextroamphetamine (ADDERALL) tablet 10 mg  10 mg Oral Q breakfast Rigoberto Noel, MD   10 mg at 01/27/15 0811  . antiseptic oral rinse (CPC / CETYLPYRIDINIUM CHLORIDE 0.05%) solution 7 mL  7 mL Mouth Rinse QID Raylene Miyamoto, MD   7 mL at 01/26/15 1600  . aspirin chewable tablet 81 mg  81 mg Oral Daily Orson Eva, MD   81 mg at 01/26/15 1105  . chlorhexidine (PERIDEX) 0.12 % solution 15 mL  15 mL Mouth Rinse BID Raylene Miyamoto, MD   15 mL at 01/27/15 0811  . dextrose 5 % solution   Intravenous Continuous Orson Eva, MD 75 mL/hr at 01/27/15 0612    . famotidine (PEPCID) 40 MG/5ML suspension 20 mg  20 mg Oral BID Cherene Altes, MD   20 mg at 01/26/15 2224  . heparin injection 5,000 Units  5,000 Units Subcutaneous 3 times per day Rahul Dianna Rossetti, PA-C   5,000 Units at 01/27/15 0612  . [place]   Oral PRN Cherene Altes, MD      . saccharomyces boulardii [facility]) capsule 250 mg  250 mg Oral BID Orson Eva, MD   250 mg at 01/26/15 2225    Musculoskeletal: Strength & Muscle Tone: decreased Gait & Station: unable  to stand Patient leans: N/A  Psychiatric Specialty Exam: Physical Exam   ROS    Blood pressure 105/54, pulse 69, temperature 97.9 F (36.6 C), temperature source Oral, resp. rate 18, height _1  (1.651 m), weight 77.8 kg (171 lb 8.3 oz), SpO2 100 %.Body mass index is 28.54 kg/(m^2).  General Appearance: Guarded  Eye Contact::  Fair  Speech:  Pressured  Volume:  Decreased  Mood:  Anxious and Depressed  Affect:  Constricted and Depressed  Thought Process:  Circumstantial, Disorganized, Irrelevant and Tangential  Orientation:  Full (Time, Place, and Person)  Thought Content:  Paranoid Ideation  and Rumination  Suicidal Thoughts:  No  Homicidal Thoughts:  No  Memory:  Immediate;   Fair Recent;   Poor  Judgement:  Impaired  Insight:  Lacking  Psychomotor Activity:  Decreased  Concentration:  Poor  Recall:  [place] of Knowledge:Fair  Language: Fair  Akathisia:  Negative  Handed:  Right  AIMS (if indicated):     Assets:  Financial Resources/Insurance Intimacy Leisure Time Resilience Social Support  ADL's:  Impaired  Cognition: Impaired,  Mild  Sleep:      Medical Decision Making: New problem, with additional work up planned, Review of Psycho-Social Stressors (1), Review or order clinical lab tests (1), Established Problem, Worsening (2), Review of Last Therapy Session (1), Review or order medicine tests (1), Review of Medication Regimen & Side Effects (2) and Review of New Medication or Change in Dosage (2)  Treatment Plan Summary: Patient has bipolar disorder and noncompliant with the treatment, patient psychiatric medication for bipolar disorder was reintroduced when she was able to stabilize medically. Patient was not able to show significant clinical improvement at this time and needed reevaluation tomorrow. Disposition plans were underway by psychiatric social service including inpatient hospitalization versus skilled nursing facility placement upon treatment.  Daily  contact with patient to assess and evaluate symptoms and progress in treatment and Medication management  Plan: Continue lithium 300 mg twice daily and Zyprexa 5 mg at bedtime for mood swings Monitor for therapeutic level of lithium in five days.  Recommend psychiatric Inpatient admission when medically cleared. Supportive therapy provided about ongoing stressors.   Appreciate psychiatric consultation and follow up as clinically required Please contact [phone] or [phone] if needs further assistance  Disposition: Patient meets criteria for acute psychiatric hospitalization as she was not taking her medication and continue to deteriorate her mental status.  Nohlan Burdin,JANARDHAHA R. 01/27/2015 9:53 AM

## 2015-01-28 DIAGNOSIS — F319 Bipolar disorder, unspecified: Secondary | ICD-10-CM

## 2015-01-28 DIAGNOSIS — N179 Acute kidney failure, unspecified: Secondary | ICD-10-CM | POA: Insufficient documentation

## 2015-01-28 LAB — BASIC METABOLIC PANEL
Anion gap: 9 (ref 5–15)
BUN: 33 mg/dL — ABNORMAL HIGH (ref 6–20)
CO2: 21 mmol/L — ABNORMAL LOW (ref 22–32)
CREATININE: 1.73 mg/dL — AB (ref 0.44–1.00)
Calcium: 12.2 mg/dL — ABNORMAL HIGH (ref 8.9–10.3)
Chloride: 115 mmol/L — ABNORMAL HIGH (ref 101–111)
GFR, EST AFRICAN AMERICAN: 36 mL/min — AB (ref >60)
GFR, EST NON AFRICAN AMERICAN: 31 mL/min — AB (ref >60)
Glucose, Bld: 87 mg/dL (ref 65–99)
Potassium: 4.2 mmol/L (ref 3.5–5.1)
Sodium: 145 mmol/L (ref 135–145)

## 2015-01-28 MED ORDER — DIVALPROEX SODIUM ER 500 MG PO TB24
500.0000 mg | ORAL_TABLET | Freq: Two times a day (BID) | ORAL | Status: DC
  Administered 2015-01-29: 500 mg via ORAL
  Filled 2015-01-28 (×3): qty 1

## 2015-01-28 NOTE — Consult Note (Signed)
Psychiatry Consult Follow Up  Reason for Consult:  Bipolar disorder and s/p Pneumonia Referring Physician:  Dr. Carles Collet Patient Identification: Kellie Velez MRN:  [id number] Principal Diagnosis: Bipolar I disorder with catatonia Diagnosis:   Patient Active Problem List   Diagnosis Date Noted  . Hypercalcemia [E83.52] 01/25/2015  . Dysphagia [R13.10] 01/25/2015  . Bipolar I disorder with catatonia [F31.9] 01/23/2015  . Colitis, Clostridium difficile [A04.7]   . Palliative care encounter [Z51.5]   . Encephalopathy [G93.40]   . Acute pulmonary edema [J81.0]   . Acute respiratory failure with hypoxia [J96.01]   . Hypernatremia [E87.0]   . MSSA (methicillin susceptible Staphylococcus aureus) pneumonia [J15.211]   . Encephalopathy acute [G93.40]   . Pressure ulcer [L89.90] 01/09/2015  . ARF (acute respiratory failure) [J96.00]   . Pneumonia [J18.9]   . Pupil asymmetry [H57.02]   . Altered mental status [R41.82] 01/07/2015  . Respiratory failure [J96.90]   . Blood poisoning [A41.9]   . Bipolar disorder, current episode mixed, severe, with psychotic features [F31.64]   . Psychoses [F29]   . Pacemaker [Z95.0] 12/31/2013  . Unspecified essential hypertension [I10] 09/28/2012  . Bipolar disorder, unspecified [F31.9] 09/28/2012  . TIA (transient ischemic attack) [G45.9] 09/28/2012  . Mild carotid artery disease [I77.9] 09/28/2012  . Osteoporosis, unspecified [M81.0] 09/28/2012  . Bradycardia [R00.1] 09/28/2012  . Heart murmur [R01.1] 09/28/2012  . Hearing loss [H91.90] 09/28/2012  . Arthritis [M19.90] 09/28/2012  . LVH (left ventricular hypertrophy) [I51.7] 09/28/2012    Total Time spent with patient: 30 minutes  Subjective:   ROSSELYN Velez is a 59 y.o. female patient admitted with AMS.  HPI: Kellie Velez is an 59 y.o. Woman seen face-to-face for psychiatric consultation and evaluation of bipolar disorder with catatonia. Patient is a poor historian unable to give me details about  her previous treatment, medications and safety concerns. Patient appeared sitting in her bed, awake, alert, oriented and able to eat her soft food with the help of staff RN today. Patient has a history of bipolar disorder and schizoaffective disorder and was at [facility]/21[facility]-07/29/14 due to psychotic symptoms. Patient has tangential and circumstantial thinking process. Review of her medical records indicated patient presented previously with significant symptoms of mania and depression. Reportedly patient is also noncompliant with her medication management which may be reason for relapse of her symptoms.  Interval history: Patient is seen today for psychiatric consultation follow-up. Patient appeared staying in her bed, awake, alert, oriented to place, person and situation. Patient has been compliant with her psych medication which were reintroduced about two days ago after treatment for pneumonia. Patient has fine immediate memory and long term memory but poor with her short term memory probably due to ECT treatments. Patient has poor insight, says some one in [facility] told that she has no bipolar disorder but recommended to continue to take her medications. She has clinically improved, able to identify and communicate appropriately with several family members including her Niece. She has denied symptoms of depression, anxiety, psychosis and no evidence of suicide or homicide ideation, intention or plans. she is calm and cooperative. Reportedly hospitalized in [facility] about 5-6 months, also received ECT treatment before she was placed in [facility] for long-term care. Patient psychiatric medication was discontinued secondary to aspiration pneumonia on admission.   Past Medical History:  Past Medical History  Diagnosis Date  . Arthritis   . Hypertension   . Nerve damage   . Osteoporosis   .  Hearing loss   . Abdominal pain   . Stroke     TIA's  .  Bradycardia, sinus, persistent, severe   . 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 Bipolar 1 disorder   . Heart murmur 08/13/2012    EF 60-65%,severe LVH,Mild AOV regurg, MR,trivial TR  . Mild carotid artery disease 05/05/2005,08/13/2012    left ICA 0-49% diameter reduction 2006; bilateral fibrous plaque mild 2013  . LVH (left ventricular hypertrophy)     Past Surgical History  Procedure Laterality Date  . Cholecystectomy  1980  . Appendectomy  1983  . Total knee arthroplasty  2003    left  . Permanent pacemaker insertion  01/01/2005    Medtronic EnRhythm  . Permanent pacemaker generator change  06/08/2011    Medtronic Adapta  . Tee without cardioversion  01/06/2005    no cardiac source of embolus   Family History: History reviewed. No pertinent family history. Social History:  History  Alcohol Use No     History  Drug Use No    History   Social History  . Marital Status: Married    Spouse Name: N/A  . Number of Children: N/A  . Years of Education: N/A   Social History Main Topics  . Smoking status: Current Every Day Smoker -- 0.50 packs/day    Types: Cigarettes  . Smokeless tobacco: Not on file  . Alcohol Use: No  . Drug Use: No  . Sexual Activity: Not on file   Other Topics Concern  . None   Social History Narrative   Additional Social History:                          Allergies:   Allergies  Allergen Reactions  . Floxin [Ofloxacin] Nausea And Vomiting and Rash  . Plavix [Clopidogrel Bisulfate]     "stomach pain"  . Epinephrine Other (See Comments)    "makes her feel excited"    Labs:  Results for orders placed or performed during the hospital encounter of 01/07/15 (from the past 48 hour(s))  Basic metabolic panel     Status: Abnormal   Collection Time: 01/27/15  6:48 AM  Result Value Ref Range   Sodium 141 135 - 145 mmol/L   Potassium 4.2 3.5 - 5.1 mmol/L   Chloride 112 (H)  101 - 111 mmol/L   CO2 21 (L) 22 - 32 mmol/L   Glucose, Bld 82 65 - 99 mg/dL   BUN 42 (H) 6 - 20 mg/dL   Creatinine, Ser 1.79 (H) 0.44 - 1.00 mg/dL   Calcium 11.8 (H) 8.9 - 10.3 mg/dL   GFR calc non Af Amer 29 (L) >60 mL/min   GFR calc Af Amer 34 (L) >60 mL/min    Comment: (NOTE) The eGFR has been calculated using the CKD EPI equation. This calculation has not been validated in all clinical situations. eGFR's persistently <60 mL/min signify possible Chronic Kidney Disease.    Anion gap 8 5 - 15  CBC     Status: Abnormal   Collection Time: 01/27/15  6:48 AM  Result Value Ref Range   WBC 6.6 4.0 - 10.5 K/uL   RBC 3.15 (L) 3.87 - 5.11 MIL/uL   Hemoglobin 8.8 (L) 12.0 - 15.0 g/dL   HCT 28.3 (L) 36.0 - 46.0 %   MCV 89.8 78.0 - 100.0 fL   MCH 27.9 26.0 - 34.0 pg  MCHC 31.1 30.0 - 36.0 g/dL   RDW 19.6 (H) 11.5 - 15.5 %   Platelets 136 (L) 150 - 400 K/uL    Vitals: Blood pressure 117/63, pulse 73, temperature 97.9 F (36.6 C), temperature source Oral, resp. rate 18, height 5' 5"  (1.651 m), weight 79.7 kg (175 lb 11.3 oz), SpO2 99 %.  Risk to Self: Is patient at risk for suicide?: No Risk to Others:   Prior Inpatient Therapy:   Prior Outpatient Therapy:    Current Facility-Administered Medications  Medication Dose Route Frequency Provider Last Rate Last Dose  . acetaminophen (TYLENOL) suppository 650 mg  650 mg Rectal Q6H PRN Cherene Altes, MD      . acetaminophen (TYLENOL) tablet 650 mg  650 mg Oral Q6H PRN Cherene Altes, MD   650 mg at 01/24/15 0923  . antiseptic oral rinse (CPC / CETYLPYRIDINIUM CHLORIDE 0.05%) solution 7 mL  7 mL Mouth Rinse QID Raylene Miyamoto, MD   7 mL at 01/27/15 1758  . aspirin chewable tablet 81 mg  81 mg Oral Daily Orson Eva, MD   81 mg at 01/27/15 1044  . chlorhexidine (PERIDEX) 0.12 % solution 15 mL  15 mL Mouth Rinse BID Raylene Miyamoto, MD   15 mL at 01/28/15 0850  . famotidine (PEPCID) 40 MG/5ML suspension 20 mg  20 mg Oral BID  Cherene Altes, MD   20 mg at 01/27/15 2256  . feeding supplement (ENSURE ENLIVE) (ENSURE ENLIVE) liquid 237 mL  237 mL Oral TID BM Jenifer A Williams, RD   237 mL at 01/27/15 2000  . heparin injection 5,000 Units  5,000 Units Subcutaneous 3 times per day Rahul P Desai, PA-C   5,000 Units at 01/27/15 1318  . lithium carbonate capsule 300 mg  300 mg Oral BID WC Orson Eva, MD   300 mg at 01/28/15 0850  . multivitamin with minerals tablet 1 tablet  1 tablet Oral Daily Domenick Bookbinder, RD   1 tablet at 01/27/15 1758  . OLANZapine (ZYPREXA) tablet 5 mg  5 mg Oral QHS Orson Eva, MD   5 mg at 01/27/15 2256  . RESOURCE THICKENUP CLEAR   Oral PRN Cherene Altes, MD      . saccharomyces boulardii [facility]) capsule 250 mg  250 mg Oral BID Orson Eva, MD   250 mg at 01/27/15 2256  . sodium chloride 0.9 % 1,000 mL infusion   Intravenous Continuous Orson Eva, MD 75 mL/hr at 01/27/15 2015      Musculoskeletal: Strength & Muscle Tone: decreased Gait & Station: unable to stand Patient leans: N/A  Psychiatric Specialty Exam: Physical Exam   ROS    Blood pressure 117/63, pulse 73, temperature 97.9 F (36.6 C), temperature source Oral, resp. rate 18, height 5' 5"  (1.651 m), weight 79.7 kg (175 lb 11.3 oz), SpO2 99 %.Body mass index is 29.24 kg/(m^2).  General Appearance: Guarded  Eye Contact::  Fair  Speech:  Clear and Coherent and Slow  Volume:  Normal  Mood:  Euthymic  Affect:  Constricted and Depressed  Thought Process:  Coherent and Goal Directed  Orientation:  Full (Time, Place, and Person)  Thought Content:  WDL  Suicidal Thoughts:  No  Homicidal Thoughts:  No  Memory:  Immediate;   Fair Recent;   Poor  Judgement:  Fair  Insight:  Lacking  Psychomotor Activity:  Decreased  Concentration:  Poor  Recall:  [place]  Language: Fair  Akathisia:  Negative  Handed:  Right  AIMS (if indicated):     Assets:  Financial Resources/Insurance Intimacy Leisure  Time Resilience Social Support  ADL's:  Impaired  Cognition: Impaired,  Mild  Sleep:      Medical Decision Making: New problem, with additional work up planned, Review of Psycho-Social Stressors (1), Review or order clinical lab tests (1), Established Problem, Worsening (2), Review of Last Therapy Session (1), Review or order medicine tests (1), Review of Medication Regimen & Side Effects (2) and Review of New Medication or Change in Dosage (2)  Treatment Plan Summary: Patient has bipolar disorder and status post pneumonia. Patient has been tolerating her psych medications and positively responded. Patient has significant clinical improvement and does not need medication adjustment at this time. Disposition plans were underway and she will be placed back in skilled nursing facility upon medically cleared.  Daily contact with patient to assess and evaluate symptoms and progress in treatment and Medication management  Plan:  Patient is psychiatrically cleared to be placed in SNF at this time Lithium 300 mg twice daily for bipolar disorder Zyprexa 5 mg at bedtime for mood swings, insomnia and poor appetite Monitor for therapeutic level of lithium in five days.  Patient does not meet criteria for psychiatric inpatient admission. Supportive therapy provided about ongoing stressors.   Appreciate psychiatric consultation and follow up as clinically required Please contact [phone] or [phone] if needs further assistance  Disposition: Patient has been stable and psychiatrically cleared for SNF when medically cleared. She will be referred to out patient care.  Wael Maestas,JANARDHAHA R. 01/28/2015 9:10 AM

## 2015-01-28 NOTE — Progress Notes (Addendum)
PROGRESS NOTE  Kellie Velez AVW:[id number] DOB: 1956/06/23 DOA: 01/07/2015 PCP: [name], MD  Brief Narrative: [name] brought to [facility] ED 5/25 with AMS due to hypernatremia as well as aspiration PNA. In ED, intubated for GCS 3 and PCCM called for admission. The pt has been hospitalized on multiple occasions since December 2015; started with mental illness, then aspiration pneumonia and recurrent c.diff. During that time she has had only an intermittent ability to have conversations which are riddled with delusions and hallucinations. 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  Significant Events: 5/25 - admitted with AMS, probable aspiration PNA, (cutlures grew MSSA) multiple metabolic derangements 5/26 - pupils changed, to CT, fevers, line was placed 5/30 - Dr Kendrick Fries discussion with s-in law - > limited code blue, continue efforts to get her off the vent - C Diff positive (reportedly 3rd episode) 5/31 - EEG x 2 c/w encephalopathy. MRI not possible due to pacer. Followed 1 command with squeeze. Still not noticed to move lowers. RUE slightly stronger 2/5 compare to LUE 1/5. Also rhythmic movement of mouth noted.  6/1 - Per Palliative MD -- patient had abrupt decline in sept and since then hospitalzed mostly in psych units 6/3 - ENT consulted - mastoid effusions related to intubaiton - serial fu in several weeks 6/7 - extubated 6/10 - TRH assumed care   Subjective: Patient is much more awake and alert today. She denies any complaints. Although she is a very poor historian due to her psychiatry issues.   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  -Patient was catatonic when she was in the ICU--improved after starting Adderall  Hypernatremia -Likely due to poor intake likely related to requirement for thickened liquids but cannot r/o a degree of nephrogenic Diabetes Insipidus from previous lithium use. Sodium levels have improved.  AKI -Baseline creatinine 0.9-1.2  -Likely due to volume depletion from poor oral intake (and requirement for thickened liquids) as well as increased insensible water loss from the patient's sepsis  -Now that she is awake and alert and is able to take orally. We'll discontinue IV fluids. The patient labs in the morning. If she is able to adequately hydrate herself orally she can be discharged tomorrow with follow-up labs next week  Hypercalcemia -The patient has been hypercalcemic throughout the hospitalization -Corrected calcium 14.5 on the day of admission -01/07/2015 PTH 61 -TSH--0.415 -Likely a component of hypercalcemia although immobility, but cannot rule out primary hyperparathyroidism -Check 25 vitamin D--25.3 -SPEP--neg M-spike -urine calcium/creatinine ratio-pending PTH has not been checked. This will be ordered for tomorrow.  Lithium could've contributed along with dehydration. She has been on lithium for a long time. Hypercalcemia is new. The intact PTH level will be important in deciding further management. Levels of lithium and calcium will need to be monitored closely as outpatient.  Bipolar D/O with catatonia -Psychiatry has been following. She has been cleared by them for discharge.  -Family's reports that she did not do well with lithium -However, case was discussed with psychiatry 01/26/2015-->  discontinue Adderall, restarted lithium and Zyprexa -6/13--case discussed with Dr.  Elsie Saas - Her guardian states that patient has not tolerated Lithium well in the past. When she was hospitalized at [facility] recently she was taken off of Lithium by the Psychiatrist but was placed back on it by the other providers. There is history of poor level control. She requests that we do not put her back on Lithium if possible. Will discuss with Dr. Elsie Saas. He recommends changing Lithium to Depakote. And close outpatient follow up.  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 - remains on oral vancomycin and fidaxomicin - continue treatment and isolation -01/25/2015--was the last day for fidaxomicin and oral vancomycin -Started florastor -still has Flexi-seal -WBC improved, no abdominal pain  Hx of CVA(R-MCA), dense left sided hemiplegia -PT/OT -likely return to SNF -Restart aspirin 81 mg daily  Acute pulm edema Resolved  Protein calorie malnutrition Cleared for D1 w/ nectar liquids per SLP - follow intake  Hyperglycemia - no hx of DM Resolved - discontinue CBG checks Check hemoglobin A1c--4.9 Hyperglycemia is likely stressed induced  DVT prophylaxis: Subcutaneous heparin Code Status: NO CPR - NO CARDIOVERSION (YES to intubation, vasoactive or antiarrhythmic drugs) Family Communication: no family present at time of exam. Discussed with her guardian Kellie Velez. Disposition Plan: Will go to SNF at the time of discharge. Possibly tomorrow.  Consultants: PCCM Psychiatry Palliative care   Procedures/Studies: Ct Head Wo Contrast  01/14/2015   CLINICAL DATA:  59 year old female with continued encephalopathy. Intubated. Initial encounter.  EXAM: CT HEAD WITHOUT CONTRAST  TECHNIQUE: Contiguous axial images were obtained from the base of the skull through the  vertex without intravenous contrast.  COMPARISON:  01/08/2015 and earlier.  FINDINGS: Partially visible endotracheal and oral enteric tubes. Stable left maxillary sinus mucous retention cyst. Left tympanic and mastoid opacification appears increased. Small volume layering fluid in the pharynx.  No acute osseous abnormality identified. No acute orbit or scalp soft tissue findings.  Calcified atherosclerosis at the skull base. Compared to 01/07/2015, abnormal right MCA territory hypodensity has not changed. This is a new finding compared to 2013. No associated mass effect is evident. No associated hemorrhage. No ventriculomegaly. Patchy hypodensity in the left MCA white matter also appears stable, and was partially present in 2013. Chronic left cerebellar infarct is unchanged since 2013. No suspicious intracranial vascular hyperdensity. No acute intracranial hemorrhage identified.  IMPRESSION: 1. Subacute versus chronic right greater than left MCA territory ischemia appears unchanged since 01/07/2015. Some of the left MCA findings were present in 2013. No associated hemorrhage or mass effect. 2. Chronic left cerebellar infarct is stable since 2013. 3. Left mastoid and tympanic effusion likely secondary to intubation.   Electronically Signed   By: Odessa Fleming M.D.   On: 01/14/2015 14:56   Ct Head Wo Contrast  01/08/2015   CLINICAL DATA:  Unresponsive, pupil asymmetry, follow-up  EXAM: CT HEAD WITHOUT CONTRAST  TECHNIQUE: Contiguous axial images were obtained from the base of the skull through the vertex without intravenous contrast.  COMPARISON:  01/07/2015  FINDINGS: No evidence of parenchymal hemorrhage or extra-axial fluid collection. No mass lesion, mass effect, or midline shift.  No CT evidence of acute infarction.  Focal hypodensity within the right frontoparietal region (series 2/ image 21), favored to reflect encephalomalacic changes related to prior right MCA distribution  infarct, likely chronic although  possibly subacute. The appearance on the current CT is slightly unusual with apparent preservation of the overlying cortical rim, although this apparent difference may be simply technical/artifactual.  Subcortical white matter and periventricular small vessel ischemic changes. Mild cranial atherosclerosis.  Mild cortical atrophy.  No ventriculomegaly.  Partial opacification of the left maxillary sinus. Partial opacification of the bilateral mastoid air cells, left greater than right.  No evidence of calvarial fracture.  IMPRESSION: Suspected prior right MCA distribution infarct, favored to be chronic, less likely subacute.  Otherwise, no evidence of acute intracranial abnormality.   Electronically Signed   By: Charline Bills M.D.   On: 01/08/2015 12:16   Ct Head Wo Contrast  01/07/2015   CLINICAL DATA:  Altered mental status.  Code sepsis.  EXAM: CT HEAD WITHOUT CONTRAST  TECHNIQUE: Contiguous axial images were obtained from the base of the skull through the vertex without intravenous contrast.  COMPARISON:  Head CT scan 07/20/2012.  FINDINGS: Since prior examination, the patient has suffered a right MCA infarct which appears remote. There is chronic microvascular ischemic change. No evidence of acute abnormality including infarct, hemorrhage, mass lesion, mass effect, midline shift or abnormal extra-axial fluid collections identified. No hydrocephalus or pneumocephalus. Mucous retention cyst or polyp left maxillary sinus is noted.  IMPRESSION: No acute abnormality.  Remote right MCA infarct.  Chronic microvascular ischemic change.   Electronically Signed   By: Drusilla Kanner M.D.   On: 01/07/2015 12:29   US Renal  01/27/2015   CLINICAL DATA:  Elevated creatinine. Acute renal injury. Renal stone.  EXAM: RENAL / URINARY TRACT ULTRASOUND COMPLETE  COMPARISON:  CT 08/04/2014.  FINDINGS: Right Kidney:  Length: 11.4 cm. Echogenic renal cortex compatible with medical renal disease. No stones or hydronephrosis.   Left Kidney and urinary bladder:  Not visible. Patient would not comply with scanning and patient became combative.  IMPRESSION: 1. Patient was noncompliant with the exam in became combative. The LEFT kidney an urinary bladder could not be imaged. 2. RIGHT kidney shows no hydronephrosis. Echogenic cortex compatible with medical renal disease.   Electronically Signed   By: Andreas Newport M.D.   On: 01/27/2015 23:00   Dg Chest Port 1 View  01/19/2015   CLINICAL DATA:  Shortness of breath.  EXAM: PORTABLE CHEST - 1 VIEW  COMPARISON:  01/18/2015.  FINDINGS: Patient rotated to the left. Endotracheal tube tip 1.8 cm above the carina. Right IJ line and NG tube in stable position. Mediastinum hilar structures are normal. Heart size stable. Cardiac pacer in stable position. Persistent bilateral lower lobe infiltrates and atelectatic changes are present. No interim change. No pleural effusion or pneumothorax.  IMPRESSION: 1. Lines and tubes in stable position. 2. Persistent bilateral lower lobe infiltrates and atelectasis. No interim change.   Electronically Signed   By: Maisie Fus  Register   On: 01/19/2015 07:09   Dg Chest Port 1 View  01/18/2015   CLINICAL DATA:  Acute respiratory failure. On ventilator.  Hypoxia  EXAM: PORTABLE CHEST - 1 VIEW  COMPARISON:  01/17/2015  FINDINGS: Patient is rotated to the left on today's exam. Support lines and tubes in appropriate position. Bilateral lower lobe airspace disease is again seen, left side greater than right, without significant change. No definite pneumothorax visualized.  IMPRESSION: Left greater than right lower lobe airspace disease, without significant change.   Electronically Signed   By: Myles Rosenthal M.D.   On: 01/18/2015 09:09   Dg Chest Port 1  View  01/17/2015   CLINICAL DATA:  Endotracheal tube present.  EXAM: PORTABLE CHEST - 1 VIEW  COMPARISON:  One-view chest 01/16/2015  FINDINGS: The heart size is normal. A left pleural effusion and basilar airspace disease are  again noted. The endotracheal tube terminates 3 cm above the carina. Pacing wires are stable. The NG tube courses off the inferior border the film. A right IJ line terminates at cavoatrial junction. Mild edema is present.  IMPRESSION: 1. No significant change in mild edema, left pleural effusion and basilar airspace disease likely atelectasis. 2. Stable and satisfactory positioning of the support apparatus.   Electronically Signed   By: Marin Roberts M.D.   On: 01/17/2015 07:33   Dg Chest Port 1 View  01/16/2015   CLINICAL DATA:  Hypoxia  EXAM: PORTABLE CHEST - 1 VIEW  COMPARISON:  Jan 12, 2015  FINDINGS: Endotracheal tube tip is 4.7 cm above the carina. Central catheter tip is essentially at the cavoatrial junction. Nasogastric tube tip and side port are below the diaphragm. No pneumothorax. There is persistent airspace consolidation on the left with minimal left effusion. Right lung is clear. Heart size and pulmonary vascularity are normal. No adenopathy. Pacemaker leads are attached to the right atrium and right ventricle.  IMPRESSION: Tube and catheter positions as described without pneumothorax. Persistent left lower lobe consolidation with small left effusion. No new opacity.   Electronically Signed   By: Bretta Bang III M.D.   On: 01/16/2015 07:14   Dg Chest Port 1 View  01/12/2015   CLINICAL DATA:  Hypoxia with acute respiratory failure  EXAM: PORTABLE CHEST - 1 VIEW  COMPARISON:  01/11/2015  FINDINGS: Cardiac shadow is stable. A pacing device, endotracheal tube, nasogastric catheter and right central venous line are again seen and stable. Bibasilar densities are seen slightly increased on the left when compared with the with the previous day.  IMPRESSION: Tubes and lines as described.  Bibasilar changes with slight increase on the left when compared with the previous day.   Electronically Signed   By: Alcide Clever M.D.   On: 01/12/2015 07:35   Dg Chest Port 1 View  01/11/2015   CLINICAL  DATA:  Acute respiratory failure  EXAM: PORTABLE CHEST - 1 VIEW  COMPARISON:  01/10/2015  FINDINGS: Bibasilar airspace opacities are stable. There are no new lung abnormalities.  No pneumothorax.  Probable small effusions.  Endotracheal tube tip is not well defined on this exam, but appears to project approximate 5 mm above the carina.  Orogastric tube and right internal jugular central venous line are stable and well positioned.  IMPRESSION: 1. No change from the previous day's study. 2. Persistent basilar airspace opacity which may reflect pneumonia, atelectasis or a combination. No evidence of pulmonary edema. 3. Support apparatus stable position.   Electronically Signed   By: Amie Portland M.D.   On: 01/11/2015 07:13   Dg Chest Port 1 View  01/10/2015   CLINICAL DATA:  Acute respiratory failure.  EXAM: PORTABLE CHEST - 1 VIEW  COMPARISON:  01/09/2015  FINDINGS: Nasogastric tube tip is in the stomach. The right IJ catheter tip is in the projection of the cavoatrial junction. Left chest wall ICD is noted with leads in the right atrial appendage and right ventricle. No significant change in bilateral lower lobe airspace opacities.  IMPRESSION: 1. No change in bilateral lower lobe airspace opacities. 2. Stable support apparatus.   Electronically Signed   By: Veronda Prude.D.  On: 01/10/2015 09:20   Dg Chest Port 1 View  01/09/2015   CLINICAL DATA:  Pneumonia.  EXAM: PORTABLE CHEST - 1 VIEW  COMPARISON:  01/08/2015.  FINDINGS: Endotracheal tube, NG tube, right IJ line in stable position. Mediastinum hilar structures are stable. Cardiac pacer lead tips in right atrium right ventricle. Heart size stable. The persistent unchanged prominent bibasilar pulmonary infiltrates consistent with pneumonia. No pleural effusion or pneumothorax.  IMPRESSION: 1.  Lines and tubes in stable position.  2. Persistent prominent bilateral lower lobe pulmonary infiltrates consistent with pneumonia.   Electronically Signed   By:  Maisie Fus  Register   On: 01/09/2015 07:29   Dg Chest Port 1 View  01/08/2015   CLINICAL DATA:  Respiratory failure, sepsis, current tobacco use  EXAM: PORTABLE CHEST - 1 VIEW  COMPARISON:  Portable chest x-ray of Jan 07, 2015  FINDINGS: The lungs are adequately inflated. There is persistent interstitial and alveolar opacity in the right lower lobe. Persistent increased retrocardiac density is demonstrated but slight improved aeration present here. The cardiac silhouette is normal in size. The pulmonary vascularity is not engorged. The endotracheal tube tip lies 2.8 cm above the carina. The esophagogastric tube tip projects below the inferior margin of the image. The right internal jugular venous catheter tip projects over the midportion of the SVC. The permanent pacemaker is in reasonable position. The bony thorax exhibits no acute abnormalities.  IMPRESSION: Persistent bibasilar atelectasis and/or pneumonia. Slight interval improvement in the left retrocardiac region has occurred. The support tubes and lines are in reasonable position.   Electronically Signed   By: David  [place]aziland M.D.   On: 01/08/2015 07:54   Dg Chest Port 1 View  01/07/2015   CLINICAL DATA:  Central line placement.  EXAM: PORTABLE CHEST - 1 VIEW  COMPARISON:  Same day.  FINDINGS: Stable cardiomediastinal silhouette. Endotracheal and nasogastric tubes are in grossly good position. Left-sided pacemaker is unchanged in position. Interval placement of right internal jugular catheter line is noted with distal tip in expected position of the cavoatrial junction. No pneumothorax is noted. Left lung is clear. Right basilar opacity is again noted concerning for pneumonia or atelectasis. No significant pleural effusion is noted.  IMPRESSION: Interval placement of right internal jugular catheter is noted with distal tip in expected position of cavoatrial junction. No pneumothorax is noted. Stable right basilar opacity is noted concerning for pneumonia or  atelectasis.   Electronically Signed   By: Lupita Raider, M.D.   On: 01/07/2015 19:01   Dg Chest Port 1 View  01/07/2015   CLINICAL DATA:  Respiratory failure endotracheal tube placement  EXAM: PORTABLE CHEST - 1 VIEW  COMPARISON:  08/04/2014  FINDINGS: Cardiomegaly again noted. Dual lead cardiac pacemaker is unchanged in position. Hazy right infrahilar atelectasis or infiltrate. There is NG tube in place. Endotracheal tube with tip at the level of carina towards right main bronchus. Endotracheal tube should be retracted about 1.5 cm. There is no pneumothorax.  IMPRESSION: Dual lead cardiac pacemaker is unchanged in position. Hazy right infrahilar atelectasis or infiltrate. There is NG tube in place. Endotracheal tube with tip at the level of carina towards right main bronchus. Endotracheal tube should be retracted about 1.5 cm. There is no pneumothorax.  These results were called by telephone at the time of interpretation on 01/07/2015 at 11:53 am to Dr. Geoffery Lyons , who verbally acknowledged these results.   Electronically Signed   By: Natasha Mead M.D.   On: 01/07/2015  11:53   Dg Abd Portable 1v  01/20/2015   CLINICAL DATA:  Evaluate NG tube placement  EXAM: PORTABLE ABDOMEN - 1 VIEW  COMPARISON:  01/10/2015  FINDINGS: NG huge it is in the peripyloric region with the side port in the antrum. Prior cholecystectomy. Nonobstructive bowel gas pattern.  IMPRESSION: NG tube tip in the peripyloric region.   Electronically Signed   By: Charlett Nose M.D.   On: 01/20/2015 17:15   Dg Abd Portable 1v  01/10/2015   CLINICAL DATA:  Orogastric tube placement.  EXAM: PORTABLE ABDOMEN - 1 VIEW  COMPARISON:  Chest x-ray today.  FINDINGS: Enteric tube is present which has tip right of midline in the upper abdomen likely over the distal stomach/ gastroduodenal junction. Bowel gas pattern is nonobstructive. IVC filter is present with tip just left of midline at the L2 level as patient is slightly rotated to the left. There  are mild degenerative changes of the spine. There are several pelvic phleboliths. A small caliber rectal catheter is present.  IMPRESSION: Nonobstructive bowel gas pattern.  Enteric tube with tip right of midline over the upper abdomen likely over the distal stomach or gastroduodenal junction.   Electronically Signed   By: Elberta Fortis M.D.   On: 01/10/2015 12:12   Dg Swallowing Func-speech Pathology  01/23/2015    Objective Swallowing Evaluation:    Patient Details  Name: Kellie Velez MRN: [id number] Date of Birth: [birth year]  Today's Date: 01/23/2015 Time: SLP Start Time (ACUTE ONLY): 1010-SLP Stop Time (ACUTE ONLY): 1030 SLP Time Calculation (min) (ACUTE ONLY): 20 min  Past Medical History:  Past Medical History  Diagnosis Date  . Arthritis   . Hypertension   . Nerve damage   . Osteoporosis   . Hearing loss   . Abdominal pain   . Stroke     TIA's  . Bradycardia, sinus, persistent, severe   . 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 Bipolar 1 disorder   . Heart murmur 08/13/2012    EF 60-65%,severe LVH,Mild AOV regurg, MR,trivial TR  . Mild carotid artery disease 05/05/2005,08/13/2012    left ICA 0-49% diameter reduction 2006; bilateral fibrous plaque mild  2013  . LVH (left ventricular hypertrophy)    Past Surgical History:  Past Surgical History  Procedure Laterality Date  . Cholecystectomy  1980  . Appendectomy  1983  . Total knee arthroplasty  2003    left  . Permanent pacemaker insertion  01/01/2005    Medtronic EnRhythm  . Permanent pacemaker generator change  06/08/2011    Medtronic Adapta  . Tee without cardioversion  01/06/2005    no cardiac source of embolus   HPI:  Other Pertinent Information: [name] brought to [facility] ED 5/25 with AMS  likely due to hypernatremia as well as aspiration PNA.  Intubated  5/25-6/7.   PmHx bipolar disorder, CVA (dense left hemiplegia), CAD.  Has  had acute encephalopathy with slow resolution; noted hallucinations and   periods of catatonia. Palliative following.    No Data Recorded  Assessment / Plan / Recommendation CHL IP CLINICAL IMPRESSIONS 01/23/2015  Therapy Diagnosis (None)  Clinical Impression Pt demonstrates moderate oral and oropharyngeal  dysphagia with suspected baseline deficits exacerbated by weakness and  decreased laryngeal sensation following prolong intubation. Oral phase is  prolonged with pumping and unnecessary mastication of liquids. Mastication  of solids is laborious due to minimal dentition. Pt holds bolus orally for  2-5 seconds prior to transit to oropharynx. Large boluses reach the  pyriform sinuses with occasional silent penetration before the swallow.  Limiting nectar to teaspoon sips eliminated penetration. Honey thick  liquids were also tolerated well though there was more oral and base of  tongue residual with honey and solids. Recommend pt initiate Dys 1 (puree)  with nectar via teaspoon. Hopeful for improvement when pt able to phonate  and cough/clear throat more effectively to sense and remove penetrate.  Will f/u for tolerance.       CHL IP TREATMENT RECOMMENDATION 01/23/2015  Treatment Recommendations Therapy as outlined in treatment plan below     CHL IP DIET RECOMMENDATION 01/23/2015  SLP Diet Recommendations Dysphagia 1 (Puree);Nectar  Liquid Administration via (None)  Medication Administration Whole meds with puree  Compensations Slow rate;Small sips/bites  Postural Changes and/or Swallow Maneuvers (None)     CHL IP OTHER RECOMMENDATIONS 01/23/2015  Recommended Consults (None)  Oral Care Recommendations Oral care BID  Other Recommendations Order thickener from pharmacy     No flowsheet data found.   CHL IP FREQUENCY AND DURATION 01/23/2015  Speech Therapy Frequency (ACUTE ONLY) min 2x/week  Treatment Duration 2 weeks     Pertinent Vitals/Pain NA    SLP Swallow Goals No flowsheet data found.  No flowsheet data found.    CHL IP REASON FOR REFERRAL 01/23/2015  Reason for Referral Objectively  evaluate swallowing function     CHL IP ORAL PHASE 01/23/2015  Lips (None)  Tongue (None)  Mucous membranes (None)  Nutritional status (None)  Other (None)  Oxygen therapy (None)  Oral Phase Impaired  Oral - Pudding Teaspoon (None)  Oral - Pudding Cup (None)  Oral - Honey Teaspoon (None)  Oral - Honey Cup (None)  Oral - Honey Syringe (None)  Oral - Nectar Teaspoon (None)  Oral - Nectar Cup (None)  Oral - Nectar Straw (None)  Oral - Nectar Syringe (None)  Oral - Ice Chips (None)  Oral - Thin Teaspoon (None)  Oral - Thin Cup (None)  Oral - Thin Straw (None)  Oral - Thin Syringe (None)  Oral - Puree (None)  Oral - Mechanical Soft (None)  Oral - Regular (None)  Oral - Multi-consistency (None)  Oral - Pill (None)  Oral Phase - Comment (None)      CH[facility] IP PHARYNGEAL PHASE 01/23/2015  Pharyngeal Phase Impaired  Pharyngeal - Pudding Teaspoon (None)  Penetration/Aspiration details (pudding teaspoon) (None)  Pharyngeal - Pudding Cup (None)  Penetration/Aspiration details (pudding cup) (None)  Pharyngeal - Honey Teaspoon (None)  Penetration/Aspiration details (honey teaspoon) (None)  Pharyngeal - Honey Cup (None)  Penetration/Aspiration details (honey cup) (None)  Pharyngeal - Honey Syringe (None)  Penetration/Aspiration details (honey syringe) (None)  Pharyngeal - Nectar Teaspoon (None)  Penetration/Aspiration details (nectar teaspoon) (None)  Pharyngeal - Nectar Cup (None)  Penetration/Aspiration details (nectar cup) (None)  Pharyngeal - Nectar Straw (None)  Penetration/Aspiration details (nectar straw) (None)  Pharyngeal - Nectar Syringe (None)  Penetration/Aspiration details (nectar syringe) (None)  Pharyngeal - Ice Chips (None)  Penetration/Aspiration details (ice chips) (None)  Pharyngeal - Thin Teaspoon (None)  Penetration/Aspiration details (thin teaspoon) (None)  Pharyngeal - Thin Cup (None)  Penetration/Aspiration details (thin cup) (None)  Pharyngeal - Thin Straw (None)  Penetration/Aspiration details (thin straw)  (None)  Pharyngeal - Thin Syringe (None)  Penetration/Aspiration details (thin syringe') (None)  Pharyngeal - Puree (None)  Penetration/Aspiration details (puree) (None)  Pharyngeal - Mechanical Soft (None)  Penetration/Aspiration details (mechanical soft) (None)  Pharyngeal - Regular (None)  Penetration/Aspiration details (regular) (None)  Pharyngeal - Multi-consistency (None)  Penetration/Aspiration details (multi-consistency) (None)  Pharyngeal - Pill (None)  Penetration/Aspiration details (pill) (None)  Pharyngeal Comment (None)      No flowsheet data found.  No flowsheet data found.        Harlon Ditty, MA CCC-SLP [phone]  Claudine Mouton 01/23/2015, 1:32 PM       Objective: Filed Vitals:   01/27/15 1509 01/27/15 2100 01/28/15 0501 01/28/15 0655  BP: 98/57 105/61  117/63  Pulse: 78 76  73  Temp: 97.7 F (36.5 C) 98.4 F (36.9 C)  97.9 F (36.6 C)  TempSrc: Oral Oral  Oral  Resp: 24   18  Height:      Weight:   79.7 kg (175 lb 11.3 oz)   SpO2: 98% 98%  99%    Intake/Output Summary (Last 24 hours) at 01/28/15 1147 Last data filed at 01/28/15 0850  Gross per 24 hour  Intake    470 ml  Output      0 ml  Net    470 ml   Weight change: 1.9 kg (4 lb 3 oz) Exam:   General:  Pt is alert, not in acute distress  Cardiovascular: RRR, S1/S2, no rubs, no gallops  Respiratory: Improved air entry bilaterally. No wheezing or crackles.   Abdomen: Soft/+BS, non tender, non distended, no guarding  Extremities: No edema, No lymphangitis, No petechiae, No rashes, no synovitis  Data Reviewed: Basic Metabolic Panel:  Recent Labs Lab 01/24/15 0300 01/25/15 0559 01/26/15 0523 01/27/15 0648 01/28/15 0954  NA 151* 153* 145 141 145  K 5.1 4.7 4.9 4.2 4.2  CL 116* 117* 115* 112* 115*  CO2 23 22 22  21* 21*  GLUCOSE 105* 96 81 82 87  BUN 55* 53* 49* 42* 33*  CREATININE 1.58* 1.79* 1.73* 1.79* 1.73*  CALCIUM 12.7* 12.8* 11.7* 11.8* 12.2*   Liver Function Tests:  Recent  Labs Lab 01/24/15 0300 01/25/15 0559  AST 27 15  ALT 16 13*  ALKPHOS 63 58  BILITOT 0.7 0.4  PROT 6.9 6.6  ALBUMIN 2.9* 2.9*   CBC:  Recent Labs Lab 01/24/15 0300 01/27/15 0648  WBC 7.8 6.6  HGB 10.0* 8.8*  HCT 33.3* 28.3*  MCV 91.0 89.8  PLT 178 136*   CBG:  Recent Labs Lab 01/24/15 0842 01/24/15 1129 01/24/15 1504 01/24/15 1935 01/25/15 0915  GLUCAP 102* 141* 140* 129* 81     Scheduled Meds: . antiseptic oral rinse  7 mL Mouth Rinse QID  . aspirin  81 mg Oral Daily  . chlorhexidine  15 mL Mouth Rinse BID  . famotidine  20 mg Oral BID  . feeding supplement (ENSURE ENLIVE)  237 mL Oral TID BM  . heparin  5,000 Units Subcutaneous 3 times per day  . lithium carbonate  300 mg Oral BID WC  . multivitamin with minerals  1 tablet Oral Daily  . OLANZapine  5 mg Oral QHS  . saccharomyces boulardii  250 mg Oral BID   Continuous Infusions:    [facility]  [facility] Pager [phone]  If 7PM-7AM, please contact night-coverage [email or web address] Password [email or web address] 01/28/2015, 11:47 AM   LOS: 21 days

## 2015-01-28 NOTE — Plan of Care (Signed)
Problem: Phase I Progression Outcomes Goal: Voiding-avoid urinary catheter unless indicated Outcome: Completed/Met Date Met:  01/28/15 Patient is incontinent

## 2015-01-28 NOTE — Progress Notes (Signed)
Speech Language Pathology Treatment: Dysphagia  Patient Details Name: VIVA CHAMBLISS MRN: [id number] DOB: 1956-04-13 Today's Date: 01/28/2015 Time: 7106-2694 SLP Time Calculation (min) (ACUTE ONLY): 12 min  Assessment / Plan / Recommendation Clinical Impression  Pt has intermittent wet vocal quality with spoonfuls of thin liquids requiring cues from SLP for cough to clear. Pt is impulsive with attempts to take larger cup sips despite education from SLP about recommendations for use of spoon. Voice remains moderately hoarse. Recommend to continue with current diet textures at this time given the above.   HPI Other Pertinent Information: [name] brought to [facility] ED 5/25 with AMS likely due to hypernatremia as well as aspiration PNA.  Intubated 5/25-6/7.   PmHx bipolar disorder, CVA (dense left hemiplegia), CAD.  Has had acute encephalopathy with slow resolution; noted hallucinations and periods of catatonia. Palliative following.     Pertinent Vitals Pain Assessment: Faces Faces Pain Scale: No hurt  SLP Plan  Continue with current plan of care    Recommendations Diet recommendations: Dysphagia 1 (puree);Nectar-thick liquid Liquids provided via: Teaspoon Medication Administration: Whole meds with puree Supervision: Patient able to self feed;Full supervision/cueing for compensatory strategies Compensations: Slow rate;Small sips/bites Postural Changes and/or Swallow Maneuvers: Seated upright 90 degrees       Oral Care Recommendations: Oral care BID Follow up Recommendations: Skilled Nursing facility Plan: Continue with current plan of care    Maxcine Ham, M.A. CCC-SLP [phone]  Maxcine Ham 01/28/2015, 9:55 AM

## 2015-01-28 NOTE — Clinical Social Work Note (Signed)
CSW spoke with patient's sister Alesia Banda regarding potential DC for tomorrow. She would like to see if patient can be placed at [facility]. If patient cannot be admitted to this facility, the family is fine with the patient returning to [facility].   Roddie Mc MSW, [place], [place], [phone]

## 2015-01-29 LAB — CBC
HCT: 29.5 % — ABNORMAL LOW (ref 36.0–46.0)
Hemoglobin: 9.2 g/dL — ABNORMAL LOW (ref 12.0–15.0)
MCH: 27.5 pg (ref 26.0–34.0)
MCHC: 31.2 g/dL (ref 30.0–36.0)
MCV: 88.3 fL (ref 78.0–100.0)
PLATELETS: 149 10*3/uL — AB (ref 150–400)
RBC: 3.34 MIL/uL — ABNORMAL LOW (ref 3.87–5.11)
RDW: 19.3 % — AB (ref 11.5–15.5)
WBC: 5.5 10*3/uL (ref 4.0–10.5)

## 2015-01-29 LAB — BASIC METABOLIC PANEL
ANION GAP: 8 (ref 5–15)
BUN: 31 mg/dL — AB (ref 6–20)
CO2: 22 mmol/L (ref 22–32)
Calcium: 11.9 mg/dL — ABNORMAL HIGH (ref 8.9–10.3)
Chloride: 113 mmol/L — ABNORMAL HIGH (ref 101–111)
Creatinine, Ser: 1.68 mg/dL — ABNORMAL HIGH (ref 0.44–1.00)
GFR calc Af Amer: 37 mL/min — ABNORMAL LOW (ref >60)
GFR, EST NON AFRICAN AMERICAN: 32 mL/min — AB (ref >60)
Glucose, Bld: 96 mg/dL (ref 65–99)
POTASSIUM: 3.8 mmol/L (ref 3.5–5.1)
SODIUM: 143 mmol/L (ref 135–145)

## 2015-01-29 MED ORDER — DIVALPROEX SODIUM ER 500 MG PO TB24: 500.0000 mg | ORAL_TABLET | Freq: Two times a day (BID) | ORAL | Status: AC

## 2015-01-29 MED ORDER — ENSURE ENLIVE PO LIQD: 237.0000 mL | Freq: Three times a day (TID) | ORAL | Status: AC

## 2015-01-29 MED ORDER — ASPIRIN EC 81 MG PO TBEC: 81.0000 mg | DELAYED_RELEASE_TABLET | Freq: Every day | ORAL | Status: AC

## 2015-01-29 NOTE — Discharge Instructions (Signed)
Depakote level in 5 days. Basic metabolic panel in 5 days for hypercalcemia and renal function.

## 2015-01-29 NOTE — Progress Notes (Signed)
Patient was discharged to SNF [facility]) by MD order; discharged instructions  review and sent to facility with care notes via EMS. IV DIC; RN called facility for report but facility's staff wasn't ready for report. SNF staff said they will call the floor for report; patient will be transported to facility via EMS.

## 2015-01-29 NOTE — Clinical Social Work Note (Signed)
Per MD patient ready to DC back to [facility]. RN, patient/family, and facility notified of patient's DC. RN given number for report. DC packet on patient's chart. Ambulance transport requested for patient. CSW signing off at this time.  Roddie Mc MSW, [place], [place], [phone]

## 2015-01-29 NOTE — Discharge Summary (Signed)
[facility]  Physician Discharge Summary   Patient ID: Kellie Velez MRN: [id number] DOB/AGE: [birth year] 59 y.o.  Admit date: 01/07/2015 Discharge date: 01/29/2015  PCP: [name], MD  DISCHARGE DIAGNOSES:  Principal Problem:   Bipolar I disorder with catatonia Active Problems:   Bipolar disorder, current episode mixed, severe, with psychotic features   Altered mental status   Respiratory failure   Blood poisoning   Pneumonia   Pupil asymmetry   Pressure ulcer   ARF (acute respiratory failure)   Acute respiratory failure with hypoxia   Hypernatremia   MSSA (methicillin susceptible Staphylococcus aureus) pneumonia   Encephalopathy acute   Acute pulmonary edema   Palliative care encounter   Encephalopathy   Colitis, Clostridium difficile   Hypercalcemia   Dysphagia   AKI (acute kidney injury)   RECOMMENDATIONS FOR OUTPATIENT FOLLOW UP: 1. Depakote level and basic metabolic panel in 5 days 2. Basic metabolic panel to be checked every week after that till calcium levels are normal. May need referral to endocrinology if calcium levels remain elevated.   DISCHARGE CONDITION: fair  Diet recommendation: Dysphagia 1 diet with nectar thick liquids  Filed Weights   01/27/15 0626 01/28/15 0501 01/29/15 0534  Weight: 77.8 kg (171 lb 8.3 oz) 79.7 kg (175 lb 11.3 oz) 79.2 kg (174 lb 9.7 oz)    INITIAL HISTORY: [name] brought to [facility] ED 5/25 with AMS due to hypernatremia as well as aspiration PNA. In ED, intubated for GCS 3 and PCCM called for admission. The pt has been hospitalized on multiple occasions since December 2015; started with mental illness, then aspiration pneumonia and recurrent c.diff. During that time she has had only an intermittent ability to have conversations which are riddled with delusions and hallucinations. 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  Significant Events: 5/25 - admitted with AMS, probable aspiration  PNA, (cutlures grew MSSA) multiple metabolic derangements 5/26 - pupils changed, to CT, fevers, line was placed 5/30 - Dr Kendrick Fries discussion with s-in law - > limited code blue, continue efforts to get her off the vent - C Diff positive (reportedly 3rd episode) 5/31 - EEG x 2 c/w encephalopathy. MRI not possible due to pacer. Followed 1 command with squeeze. Still not noticed to move lowers. RUE slightly stronger 2/5 compare to LUE 1/5. Also rhythmic movement of mouth noted.  6/1 - Per Palliative MD -- patient had abrupt decline in sept and since then hospitalzed mostly in psych units 6/3 - ENT consulted - mastoid effusions related to intubaiton - serial fu in several weeks 6/7 - extubated 6/10 - [facility] assumed care    HOSPITAL COURSE:   VDRF due to inability to protect airway/acute respiratory failure Resolved and successfully extubated on 01/20/2015. Currently stable on room air.  Acute metabolic encephalopathy  This was thought to be multifactorial including sepsis, hypernatremia, hypercalcemia, catatonia from her bipolar disorder. Initially it was difficult to separate delirium/encephalopathy from her severe baseline psychiatric disorder. Her mental status has greatly improved and she is at her baseline per family. Patient was catatonic when she was in the ICU.  Hypernatremia Likely due to poor intake likely related to requirement for thickened liquids but cannot r/o a degree of nephrogenic Diabetes Insipidus from previous lithium use. Sodium levels have improved.  Acute kidney injury  Baseline creatinine 0.9-1.2. Elevated BUN and creatinine was likely due to volume depletion from poor oral intake (and requirement for thickened liquids) as well as increased insensible water loss from  the patient's sepsis. Patient's renal function improved with IV fluids. Her oral intake also improved. She was taken off IV fluids yesterday. Renal function is. We checked this morning and it  continues to show improvement. This could imply that now she is able to keep herself hydrated by her oral intake.  Hypercalcemia The patient has been hypercalcemic throughout the hospitalization. Corrected calcium 14.5 on the day of admission. Intact PTH was 61. TSH is 0.415. 25 vitamin D was 25.3. Hypercalcemia, likely a result of dehydration, but could also be present due to use of lithium. Calcium level has been stable. After discussions with psychiatry and the patient's family lithium has been discontinued. Her renal function and calcium levels will need to be repeated in about 5 days and then periodically after that. If calcium levels persistently remain elevated she may need to be referred to endocrinology. SPEP--neg M-spike.   Bipolar D/O with catatonia Psychiatry has been following. She has been cleared by them for discharge. Family's reports that she did not do well with lithium. Psychiatry has made changes to her cycle tropic medications. Had another extensive discussion with Dr. Elsie Saas yesterday. He suggests using Depakote instead of lithium in view of all of the above. This has been initiated. Patient appears to be tolerating it quite well. Her Depakote level will need to be checked in about 5 days' time.  Septic shock This was secondary to aspiration pneumonia. Sepsis physiology/shock physiology resolved. Patient is presently hemodynamically stable.  MSSA pneumonia Completed 7 days of Ancef  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. She was started on oral vancomycin and fidaxomicin. 01/25/2015--was the last day for fidaxomicin and oral vancomycin. Continue florastor. WBC improved, no abdominal pain.  Hx of CVA (R-MCA), dense left sided hemiplegia She will return to skilled nursing facility. Continue aspirin. Will need ongoing physical and occupational therapy.  Acute pulm edema Resolved  Protein calorie malnutrition Continue diet and nutritional  supplements.   Hyperglycemia - no hx of DM Resolved. Hemoglobin A1c--4.9.   Patient remains overall stable. Her mental status is improved though she still remains distracted at times. Medications stable for discharge to skilled nursing facility today.   PERTINENT LABS:  The results of significant diagnostics from this hospitalization (including imaging, microbiology, ancillary and laboratory) are listed below for reference.     Labs: Basic Metabolic Panel:  Recent Labs Lab 01/25/15 0559 01/26/15 0523 01/27/15 0648 01/28/15 0954 01/29/15 0610  NA 153* 145 141 145 143  K 4.7 4.9 4.2 4.2 3.8  CL 117* 115* 112* 115* 113*  CO2 22 22 21* 21* 22  GLUCOSE 96 81 82 87 96  BUN 53* 49* 42* 33* 31*  CREATININE 1.79* 1.73* 1.79* 1.73* 1.68*  CALCIUM 12.8* 11.7* 11.8* 12.2* 11.9*   Liver Function Tests:  Recent Labs Lab 01/24/15 0300 01/25/15 0559  AST 27 15  ALT 16 13*  ALKPHOS 63 58  BILITOT 0.7 0.4  PROT 6.9 6.6  ALBUMIN 2.9* 2.9*   CBC:  Recent Labs Lab 01/24/15 0300 01/27/15 0648 01/29/15 0610  WBC 7.8 6.6 5.5  HGB 10.0* 8.8* 9.2*  HCT 33.3* 28.3* 29.5*  MCV 91.0 89.8 88.3  PLT 178 136* 149*   BNP: BNP (last 3 results)  Recent Labs  01/07/15 1104 01/18/15 0402  BNP 632.6* 54.2    CBG:  Recent Labs Lab 01/24/15 0842 01/24/15 1129 01/24/15 1504 01/24/15 1935 01/25/15 0915  GLUCAP 102* 141* 140* 129* 81  IMAGING STUDIES Ct Head Wo Contrast  01/14/2015   CLINICAL DATA:  59 year old female with continued encephalopathy. Intubated. Initial encounter.  EXAM: CT HEAD WITHOUT CONTRAST  TECHNIQUE: Contiguous axial images were obtained from the base of the skull through the vertex without intravenous contrast.  COMPARISON:  01/08/2015 and earlier.  FINDINGS: Partially visible endotracheal and oral enteric tubes. Stable left maxillary sinus mucous retention cyst. Left tympanic and mastoid opacification appears increased. Small volume layering fluid in  the pharynx.  No acute osseous abnormality identified. No acute orbit or scalp soft tissue findings.  Calcified atherosclerosis at the skull base. Compared to 01/07/2015, abnormal right MCA territory hypodensity has not changed. This is a new finding compared to 2013. No associated mass effect is evident. No associated hemorrhage. No ventriculomegaly. Patchy hypodensity in the left MCA white matter also appears stable, and was partially present in 2013. Chronic left cerebellar infarct is unchanged since 2013. No suspicious intracranial vascular hyperdensity. No acute intracranial hemorrhage identified.  IMPRESSION: 1. Subacute versus chronic right greater than left MCA territory ischemia appears unchanged since 01/07/2015. Some of the left MCA findings were present in 2013. No associated hemorrhage or mass effect. 2. Chronic left cerebellar infarct is stable since 2013. 3. Left mastoid and tympanic effusion likely secondary to intubation.   Electronically Signed   By: Odessa Fleming M.D.   On: 01/14/2015 14:56   Ct Head Wo Contrast  01/08/2015   CLINICAL DATA:  Unresponsive, pupil asymmetry, follow-up  EXAM: CT HEAD WITHOUT CONTRAST  TECHNIQUE: Contiguous axial images were obtained from the base of the skull through the vertex without intravenous contrast.  COMPARISON:  01/07/2015  FINDINGS: No evidence of parenchymal hemorrhage or extra-axial fluid collection. No mass lesion, mass effect, or midline shift.  No CT evidence of acute infarction.  Focal hypodensity within the right frontoparietal region (series 2/ image 21), favored to reflect encephalomalacic changes related to prior right MCA distribution infarct, likely chronic although possibly subacute. The appearance on the current CT is slightly unusual with apparent preservation of the overlying cortical rim, although this apparent difference may be simply technical/artifactual.  Subcortical white matter and periventricular small vessel ischemic changes. Mild  cranial atherosclerosis.  Mild cortical atrophy.  No ventriculomegaly.  Partial opacification of the left maxillary sinus. Partial opacification of the bilateral mastoid air cells, left greater than right.  No evidence of calvarial fracture.  IMPRESSION: Suspected prior right MCA distribution infarct, favored to be chronic, less likely subacute.  Otherwise, no evidence of acute intracranial abnormality.   Electronically Signed   By: Charline Bills M.D.   On: 01/08/2015 12:16   Ct Head Wo Contrast  01/07/2015   CLINICAL DATA:  Altered mental status.  Code sepsis.  EXAM: CT HEAD WITHOUT CONTRAST  TECHNIQUE: Contiguous axial images were obtained from the base of the skull through the vertex without intravenous contrast.  COMPARISON:  Head CT scan 07/20/2012.  FINDINGS: Since prior examination, the patient has suffered a right MCA infarct which appears remote. There is chronic microvascular ischemic change. No evidence of acute abnormality including infarct, hemorrhage, mass lesion, mass effect, midline shift or abnormal extra-axial fluid collections identified. No hydrocephalus or pneumocephalus. Mucous retention cyst or polyp left maxillary sinus is noted.  IMPRESSION: No acute abnormality.  Remote right MCA infarct.  Chronic microvascular ischemic change.   Electronically Signed   By: Drusilla Kanner M.D.   On: 01/07/2015 12:29   US Renal  01/27/2015   CLINICAL DATA:  Elevated creatinine.  Acute renal injury. Renal stone.  EXAM: RENAL / URINARY TRACT ULTRASOUND COMPLETE  COMPARISON:  CT 08/04/2014.  FINDINGS: Right Kidney:  Length: 11.4 cm. Echogenic renal cortex compatible with medical renal disease. No stones or hydronephrosis.  Left Kidney and urinary bladder:  Not visible. Patient would not comply with scanning and patient became combative.  IMPRESSION: 1. Patient was noncompliant with the exam in became combative. The LEFT kidney an urinary bladder could not be imaged. 2. RIGHT kidney shows no  hydronephrosis. Echogenic cortex compatible with medical renal disease.   Electronically Signed   By: Andreas Newport M.D.   On: 01/27/2015 23:00   Dg Chest Port 1 View  01/19/2015   CLINICAL DATA:  Shortness of breath.  EXAM: PORTABLE CHEST - 1 VIEW  COMPARISON:  01/18/2015.  FINDINGS: Patient rotated to the left. Endotracheal tube tip 1.8 cm above the carina. Right IJ line and NG tube in stable position. Mediastinum hilar structures are normal. Heart size stable. Cardiac pacer in stable position. Persistent bilateral lower lobe infiltrates and atelectatic changes are present. No interim change. No pleural effusion or pneumothorax.  IMPRESSION: 1. Lines and tubes in stable position. 2. Persistent bilateral lower lobe infiltrates and atelectasis. No interim change.   Electronically Signed   By: Maisie Fus  Register   On: 01/19/2015 07:09   Dg Chest Port 1 View  01/18/2015   CLINICAL DATA:  Acute respiratory failure. On ventilator.  Hypoxia  EXAM: PORTABLE CHEST - 1 VIEW  COMPARISON:  01/17/2015  FINDINGS: Patient is rotated to the left on today's exam. Support lines and tubes in appropriate position. Bilateral lower lobe airspace disease is again seen, left side greater than right, without significant change. No definite pneumothorax visualized.  IMPRESSION: Left greater than right lower lobe airspace disease, without significant change.   Electronically Signed   By: Myles Rosenthal M.D.   On: 01/18/2015 09:09   Dg Chest Port 1 View  01/17/2015   CLINICAL DATA:  Endotracheal tube present.  EXAM: PORTABLE CHEST - 1 VIEW  COMPARISON:  One-view chest 01/16/2015  FINDINGS: The heart size is normal. A left pleural effusion and basilar airspace disease are again noted. The endotracheal tube terminates 3 cm above the carina. Pacing wires are stable. The NG tube courses off the inferior border the film. A right IJ line terminates at cavoatrial junction. Mild edema is present.  IMPRESSION: 1. No significant change in mild  edema, left pleural effusion and basilar airspace disease likely atelectasis. 2. Stable and satisfactory positioning of the support apparatus.   Electronically Signed   By: Marin Roberts M.D.   On: 01/17/2015 07:33   Dg Chest Port 1 View  01/16/2015   CLINICAL DATA:  Hypoxia  EXAM: PORTABLE CHEST - 1 VIEW  COMPARISON:  Jan 12, 2015  FINDINGS: Endotracheal tube tip is 4.7 cm above the carina. Central catheter tip is essentially at the cavoatrial junction. Nasogastric tube tip and side port are below the diaphragm. No pneumothorax. There is persistent airspace consolidation on the left with minimal left effusion. Right lung is clear. Heart size and pulmonary vascularity are normal. No adenopathy. Pacemaker leads are attached to the right atrium and right ventricle.  IMPRESSION: Tube and catheter positions as described without pneumothorax. Persistent left lower lobe consolidation with small left effusion. No new opacity.   Electronically Signed   By: Bretta Bang III M.D.   On: 01/16/2015 07:14   Dg Chest Port 1 View  01/12/2015   CLINICAL  DATA:  Hypoxia with acute respiratory failure  EXAM: PORTABLE CHEST - 1 VIEW  COMPARISON:  01/11/2015  FINDINGS: Cardiac shadow is stable. A pacing device, endotracheal tube, nasogastric catheter and right central venous line are again seen and stable. Bibasilar densities are seen slightly increased on the left when compared with the with the previous day.  IMPRESSION: Tubes and lines as described.  Bibasilar changes with slight increase on the left when compared with the previous day.   Electronically Signed   By: Alcide Clever M.D.   On: 01/12/2015 07:35   Dg Chest Port 1 View  01/11/2015   CLINICAL DATA:  Acute respiratory failure  EXAM: PORTABLE CHEST - 1 VIEW  COMPARISON:  01/10/2015  FINDINGS: Bibasilar airspace opacities are stable. There are no new lung abnormalities.  No pneumothorax.  Probable small effusions.  Endotracheal tube tip is not well defined  on this exam, but appears to project approximate 5 mm above the carina.  Orogastric tube and right internal jugular central venous line are stable and well positioned.  IMPRESSION: 1. No change from the previous day's study. 2. Persistent basilar airspace opacity which may reflect pneumonia, atelectasis or a combination. No evidence of pulmonary edema. 3. Support apparatus stable position.   Electronically Signed   By: Amie Portland M.D.   On: 01/11/2015 07:13   Dg Chest Port 1 View  01/10/2015   CLINICAL DATA:  Acute respiratory failure.  EXAM: PORTABLE CHEST - 1 VIEW  COMPARISON:  01/09/2015  FINDINGS: Nasogastric tube tip is in the stomach. The right IJ catheter tip is in the projection of the cavoatrial junction. Left chest wall ICD is noted with leads in the right atrial appendage and right ventricle. No significant change in bilateral lower lobe airspace opacities.  IMPRESSION: 1. No change in bilateral lower lobe airspace opacities. 2. Stable support apparatus.   Electronically Signed   By: Signa Kell M.D.   On: 01/10/2015 09:20   Dg Chest Port 1 View  01/09/2015   CLINICAL DATA:  Pneumonia.  EXAM: PORTABLE CHEST - 1 VIEW  COMPARISON:  01/08/2015.  FINDINGS: Endotracheal tube, NG tube, right IJ line in stable position. Mediastinum hilar structures are stable. Cardiac pacer lead tips in right atrium right ventricle. Heart size stable. The persistent unchanged prominent bibasilar pulmonary infiltrates consistent with pneumonia. No pleural effusion or pneumothorax.  IMPRESSION: 1.  Lines and tubes in stable position.  2. Persistent prominent bilateral lower lobe pulmonary infiltrates consistent with pneumonia.   Electronically Signed   By: Maisie Fus  Register   On: 01/09/2015 07:29   Dg Chest Port 1 View  01/08/2015   CLINICAL DATA:  Respiratory failure, sepsis, current tobacco use  EXAM: PORTABLE CHEST - 1 VIEW  COMPARISON:  Portable chest x-ray of Jan 07, 2015  FINDINGS: The lungs are adequately  inflated. There is persistent interstitial and alveolar opacity in the right lower lobe. Persistent increased retrocardiac density is demonstrated but slight improved aeration present here. The cardiac silhouette is normal in size. The pulmonary vascularity is not engorged. The endotracheal tube tip lies 2.8 cm above the carina. The esophagogastric tube tip projects below the inferior margin of the image. The right internal jugular venous catheter tip projects over the midportion of the SVC. The permanent pacemaker is in reasonable position. The bony thorax exhibits no acute abnormalities.  IMPRESSION: Persistent bibasilar atelectasis and/or pneumonia. Slight interval improvement in the left retrocardiac region has occurred. The support tubes and lines are in reasonable  position.   Electronically Signed   By: David  [place]aziland M.D.   On: 01/08/2015 07:54   Dg Chest Port 1 View  01/07/2015   CLINICAL DATA:  Central line placement.  EXAM: PORTABLE CHEST - 1 VIEW  COMPARISON:  Same day.  FINDINGS: Stable cardiomediastinal silhouette. Endotracheal and nasogastric tubes are in grossly good position. Left-sided pacemaker is unchanged in position. Interval placement of right internal jugular catheter line is noted with distal tip in expected position of the cavoatrial junction. No pneumothorax is noted. Left lung is clear. Right basilar opacity is again noted concerning for pneumonia or atelectasis. No significant pleural effusion is noted.  IMPRESSION: Interval placement of right internal jugular catheter is noted with distal tip in expected position of cavoatrial junction. No pneumothorax is noted. Stable right basilar opacity is noted concerning for pneumonia or atelectasis.   Electronically Signed   By: Lupita Raider, M.D.   On: 01/07/2015 19:01   Dg Chest Port 1 View  01/07/2015   CLINICAL DATA:  Respiratory failure endotracheal tube placement  EXAM: PORTABLE CHEST - 1 VIEW  COMPARISON:  08/04/2014  FINDINGS:  Cardiomegaly again noted. Dual lead cardiac pacemaker is unchanged in position. Hazy right infrahilar atelectasis or infiltrate. There is NG tube in place. Endotracheal tube with tip at the level of carina towards right main bronchus. Endotracheal tube should be retracted about 1.5 cm. There is no pneumothorax.  IMPRESSION: Dual lead cardiac pacemaker is unchanged in position. Hazy right infrahilar atelectasis or infiltrate. There is NG tube in place. Endotracheal tube with tip at the level of carina towards right main bronchus. Endotracheal tube should be retracted about 1.5 cm. There is no pneumothorax.  These results were called by telephone at the time of interpretation on 01/07/2015 at 11:53 am to Dr. Geoffery Lyons , who verbally acknowledged these results.   Electronically Signed   By: Natasha Mead M.D.   On: 01/07/2015 11:53   Dg Abd Portable 1v  01/20/2015   CLINICAL DATA:  Evaluate NG tube placement  EXAM: PORTABLE ABDOMEN - 1 VIEW  COMPARISON:  01/10/2015  FINDINGS: NG huge it is in the peripyloric region with the side port in the antrum. Prior cholecystectomy. Nonobstructive bowel gas pattern.  IMPRESSION: NG tube tip in the peripyloric region.   Electronically Signed   By: Charlett Nose M.D.   On: 01/20/2015 17:15   Dg Abd Portable 1v  01/10/2015   CLINICAL DATA:  Orogastric tube placement.  EXAM: PORTABLE ABDOMEN - 1 VIEW  COMPARISON:  Chest x-ray today.  FINDINGS: Enteric tube is present which has tip right of midline in the upper abdomen likely over the distal stomach/ gastroduodenal junction. Bowel gas pattern is nonobstructive. IVC filter is present with tip just left of midline at the L2 level as patient is slightly rotated to the left. There are mild degenerative changes of the spine. There are several pelvic phleboliths. A small caliber rectal catheter is present.  IMPRESSION: Nonobstructive bowel gas pattern.  Enteric tube with tip right of midline over the upper abdomen likely over the distal  stomach or gastroduodenal junction.   Electronically Signed   By: Elberta Fortis M.D.   On: 01/10/2015 12:12   Dg Swallowing Func-speech Pathology  01/23/2015    Objective Swallowing Evaluation:    Patient Details  Name: Kellie Velez MRN: [id number] Date of Birth: 1956/05/17  Today's Date: 01/23/2015 Time: SLP Start Time (ACUTE ONLY): 1010-SLP Stop Time (ACUTE ONLY): 1030 SLP Time  Calculation (min) (ACUTE ONLY): 20 min  Past Medical History:  Past Medical History  Diagnosis Date  . Arthritis   . Hypertension   . Nerve damage   . Osteoporosis   . Hearing loss   . Abdominal pain   . Stroke     TIA's  . Bradycardia, sinus, persistent, severe   . 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 Bipolar 1 disorder   . Heart murmur 08/13/2012    EF 60-65%,severe LVH,Mild AOV regurg, MR,trivial TR  . Mild carotid artery disease 05/05/2005,08/13/2012    left ICA 0-49% diameter reduction 2006; bilateral fibrous plaque mild  2013  . LVH (left ventricular hypertrophy)    Past Surgical History:  Past Surgical History  Procedure Laterality Date  . Cholecystectomy  1980  . Appendectomy  1983  . Total knee arthroplasty  2003    left  . Permanent pacemaker insertion  01/01/2005    Medtronic EnRhythm  . Permanent pacemaker generator change  06/08/2011    Medtronic Adapta  . Tee without cardioversion  01/06/2005    no cardiac source of embolus   HPI:  Other Pertinent Information: [name] brought to [facility] ED 5/25 with AMS  likely due to hypernatremia as well as aspiration PNA.  Intubated  5/25-6/7.   PmHx bipolar disorder, CVA (dense left hemiplegia), CAD.  Has  had acute encephalopathy with slow resolution; noted hallucinations and  periods of catatonia. Palliative following.    No Data Recorded  Assessment / Plan / Recommendation CHL IP CLINICAL IMPRESSIONS 01/23/2015  Therapy Diagnosis (None)  Clinical Impression Pt demonstrates moderate oral and oropharyngeal  dysphagia with suspected  baseline deficits exacerbated by weakness and  decreased laryngeal sensation following prolong intubation. Oral phase is  prolonged with pumping and unnecessary mastication of liquids. Mastication  of solids is laborious due to minimal dentition. Pt holds bolus orally for  2-5 seconds prior to transit to oropharynx. Large boluses reach the  pyriform sinuses with occasional silent penetration before the swallow.  Limiting nectar to teaspoon sips eliminated penetration. Honey thick  liquids were also tolerated well though there was more oral and base of  tongue residual with honey and solids. Recommend pt initiate Dys 1 (puree)  with nectar via teaspoon. Hopeful for improvement when pt able to phonate  and cough/clear throat more effectively to sense and remove penetrate.  Will f/u for tolerance.       CHL IP TREATMENT RECOMMENDATION 01/23/2015  Treatment Recommendations Therapy as outlined in treatment plan below     CHL IP DIET RECOMMENDATION 01/23/2015  SLP Diet Recommendations Dysphagia 1 (Puree);Nectar  Liquid Administration via (None)  Medication Administration Whole meds with puree  Compensations Slow rate;Small sips/bites  Postural Changes and/or Swallow Maneuvers (None)     CHL IP OTHER RECOMMENDATIONS 01/23/2015  Recommended Consults (None)  Oral Care Recommendations Oral care BID  Other Recommendations Order thickener from pharmacy     No flowsheet data found.   CHL IP FREQUENCY AND DURATION 01/23/2015  Speech Therapy Frequency (ACUTE ONLY) min 2x/week  Treatment Duration 2 weeks     Pertinent Vitals/Pain NA    SLP Swallow Goals No flowsheet data found.  No flowsheet data found.    CHL IP REASON FOR REFERRAL 01/23/2015  Reason for Referral Objectively evaluate swallowing function     CHL IP ORAL PHASE 01/23/2015  Lips (None)  Tongue (None)  Mucous membranes (None)  Nutritional status (None)  Other (None)  Oxygen therapy (None)  Oral Phase Impaired  Oral - Pudding Teaspoon (None)  Oral - Pudding Cup (None)  Oral -  Honey Teaspoon (None)  Oral - Honey Cup (None)  Oral - Honey Syringe (None)  Oral - Nectar Teaspoon (None)  Oral - Nectar Cup (None)  Oral - Nectar Straw (None)  Oral - Nectar Syringe (None)  Oral - Ice Chips (None)  Oral - Thin Teaspoon (None)  Oral - Thin Cup (None)  Oral - Thin Straw (None)  Oral - Thin Syringe (None)  Oral - Puree (None)  Oral - Mechanical Soft (None)  Oral - Regular (None)  Oral - Multi-consistency (None)  Oral - Pill (None)  Oral Phase - Comment (None)      CHL IP PHARYNGEAL PHASE 01/23/2015  Pharyngeal Phase Impaired  Pharyngeal - Pudding Teaspoon (None)  Penetration/Aspiration details (pudding teaspoon) (None)  Pharyngeal - Pudding Cup (None)  Penetration/Aspiration details (pudding cup) (None)  Pharyngeal - Honey Teaspoon (None)  Penetration/Aspiration details (honey teaspoon) (None)  Pharyngeal - Honey Cup (None)  Penetration/Aspiration details (honey cup) (None)  Pharyngeal - Honey Syringe (None)  Penetration/Aspiration details (honey syringe) (None)  Pharyngeal - Nectar Teaspoon (None)  Penetration/Aspiration details (nectar teaspoon) (None)  Pharyngeal - Nectar Cup (None)  Penetration/Aspiration details (nectar cup) (None)  Pharyngeal - Nectar Straw (None)  Penetration/Aspiration details (nectar straw) (None)  Pharyngeal - Nectar Syringe (None)  Penetration/Aspiration details (nectar syringe) (None)  Pharyngeal - Ice Chips (None)  Penetration/Aspiration details (ice chips) (None)  Pharyngeal - Thin Teaspoon (None)  Penetration/Aspiration details (thin teaspoon) (None)  Pharyngeal - Thin Cup (None)  Penetration/Aspiration details (thin cup) (None)  Pharyngeal - Thin Straw (None)  Penetration/Aspiration details (thin straw) (None)  Pharyngeal - Thin Syringe (None)  Penetration/Aspiration details (thin syringe') (None)  Pharyngeal - Puree (None)  Penetration/Aspiration details (puree) (None)  Pharyngeal - Mechanical Soft (None)  Penetration/Aspiration details (mechanical soft) (None)   Pharyngeal - Regular (None)  Penetration/Aspiration details (regular) (None)  Pharyngeal - Multi-consistency (None)  Penetration/Aspiration details (multi-consistency) (None)  Pharyngeal - Pill (None)  Penetration/Aspiration details (pill) (None)  Pharyngeal Comment (None)      No flowsheet data found.  No flowsheet data found.        Harlon Ditty, MA CCC-SLP [phone]  Claudine Mouton 01/23/2015, 1:32 PM     DISCHARGE EXAMINATION: Filed Vitals:   01/28/15 0655 01/28/15 1525 01/28/15 2308 01/29/15 0534  BP: 117/63 110/63 101/49 128/71  Pulse: 73 80 75 78  Temp: 97.9 F (36.6 C) 98.4 F (36.9 C) 98.6 F (37 C) 98.6 F (37 C)  TempSrc: Oral Oral Oral Oral  Resp: 18 16 20    Height:      Weight:    79.2 kg (174 lb 9.7 oz)  SpO2: 99% 98% 96% 93%   General appearance: alert, distracted and no distress Resp: clear to auscultation bilaterally Cardio: regular rate and rhythm, S1, S2 normal, no murmur, click, rub or gallop GI: soft, non-tender; bowel sounds normal; no masses,  no organomegaly  DISPOSITION: SNF  Discharge Instructions    Call MD for:  difficulty breathing, headache or visual disturbances    Complete by:  As directed      Call MD for:  extreme fatigue    Complete by:  As directed      Call MD for:  persistant dizziness or light-headedness    Complete by:  As directed      Call MD for:  persistant nausea and  vomiting    Complete by:  As directed      Call MD for:  severe uncontrolled pain    Complete by:  As directed      Discharge diet:    Complete by:  As directed   Dysphagia 1 diet with nectar thick liquids.     Discharge instructions    Complete by:  As directed   Depakote level and BMET in 5 days.  You were cared for by a hospitalist during your hospital stay. If you have any questions about your discharge medications or the care you received while you were in the hospital after you are discharged, you can call the unit and asked to speak with the  hospitalist on call if the hospitalist that took care of you is not available. Once you are discharged, your primary care physician will handle any further medical issues. Please note that NO REFILLS for any discharge medications will be authorized once you are discharged, as it is imperative that you return to your primary care physician (or establish a relationship with a primary care physician if you do not have one) for your aftercare needs so that they can reassess your need for medications and monitor your lab values. If you do not have a primary care physician, you can call [phone] for a physician referral.     Increase activity slowly    Complete by:  As directed            ALLERGIES:  Allergies  Allergen Reactions  . Floxin [Ofloxacin] Nausea And Vomiting and Rash  . Plavix [Clopidogrel Bisulfate]     "stomach pain"  . Epinephrine Other (See Comments)    "makes her feel excited"     Current Discharge Medication List    START taking these medications   Details  aspirin EC 81 MG tablet Take 1 tablet (81 mg total) by mouth daily.    divalproex (DEPAKOTE ER) 500 MG 24 hr tablet Take 1 tablet (500 mg total) by mouth 2 (two) times daily.    feeding supplement, ENSURE ENLIVE, (ENSURE ENLIVE) LIQD Take 237 mLs by mouth 3 (three) times daily between meals. Qty: 237 mL, Refills: 12      CONTINUE these medications which have NOT CHANGED   Details  acetaminophen (TYLENOL) 500 MG tablet Take 500 mg by mouth every 6 (six) hours as needed. For pain    lidocaine (XYLOCAINE) 2 % solution Take 10 mLs by mouth every 3 (three) hours as needed for mouth pain.     Multiple Vitamins-Minerals (MULTIVITAMIN & MINERAL PO) Take 1 tablet by mouth every morning.    OLANZapine (ZYPREXA) 5 MG tablet Take 5 mg by mouth at bedtime.    omeprazole (PRILOSEC) 20 MG capsule Take 20 mg by mouth every morning.    saccharomyces boulardii (FLORASTOR) 250 MG capsule Take 250 mg by mouth 2 (two) times  daily.      STOP taking these medications     lithium 300 MG tablet          TOTAL DISCHARGE TIME: 35 minutes  [facility]  [facility] Pager [phone]  01/29/2015, 12:48 PM

## 2015-01-30 LAB — PTH, INTACT AND CALCIUM
Calcium, Total (PTH): 11.5 mg/dL — ABNORMAL HIGH (ref 8.7–10.3)
PTH: 20 pg/mL (ref 15–65)

## 2015-02-02 ENCOUNTER — Telehealth: Payer: Self-pay | Admitting: Cardiology

## 2015-02-02 NOTE — Telephone Encounter (Signed)
Pt POA called and stated that pt has been in and out of hospital and is now in a long term nursing home. Pt is not mobile and can only transport by EMS and on a stretcher. Pt POA wants to know if pt can have pt PPM followed remotely from this point forward? Pt POA aware that I will send note to MD and device tech. Someone will be contact w/ her about

## 2015-02-02 NOTE — Telephone Encounter (Signed)
Yes, please schedule for remote follow up only

## 2015-02-02 NOTE — Telephone Encounter (Signed)
Spoke w/ pt POA and informed her that MD is ok to follow up remotely from this point forward. Informed her that pt 1st transmission is scheduled for 04-06-2015 b/c we are waiting for wirex to arrive which was ordered today. Pt POA verbalized understanding.

## 2015-02-02 NOTE — Telephone Encounter (Signed)
LMOVM for pt POA to return call.

## 2015-02-25 ENCOUNTER — Other Ambulatory Visit (HOSPITAL_COMMUNITY): Payer: Self-pay | Admitting: Internal Medicine

## 2015-02-25 DIAGNOSIS — J9690 Respiratory failure, unspecified, unspecified whether with hypoxia or hypercapnia: Secondary | ICD-10-CM

## 2015-02-25 DIAGNOSIS — J189 Pneumonia, unspecified organism: Secondary | ICD-10-CM

## 2015-02-25 DIAGNOSIS — R1314 Dysphagia, pharyngoesophageal phase: Secondary | ICD-10-CM

## 2015-03-02 ENCOUNTER — Ambulatory Visit (HOSPITAL_COMMUNITY)
Admission: RE | Admit: 2015-03-02 | Discharge: 2015-03-02 | Disposition: A | Discharge status: Home / Self Care | Payer: Medicare Other | Source: Ambulatory Visit | Attending: Internal Medicine | Admitting: Internal Medicine

## 2015-03-02 DIAGNOSIS — R1314 Dysphagia, pharyngoesophageal phase: Secondary | ICD-10-CM | POA: Diagnosis present

## 2015-03-02 DIAGNOSIS — J9690 Respiratory failure, unspecified, unspecified whether with hypoxia or hypercapnia: Secondary | ICD-10-CM | POA: Diagnosis not present

## 2015-03-02 DIAGNOSIS — J189 Pneumonia, unspecified organism: Secondary | ICD-10-CM

## 2015-03-02 DIAGNOSIS — Z8701 Personal history of pneumonia (recurrent): Secondary | ICD-10-CM | POA: Insufficient documentation

## 2015-03-02 NOTE — Progress Notes (Signed)
Speech Pathology   MBSS complete. Full report located under chart review in imaging section, click on DG swallow function.      Breck CoonsLisa Willis PrincetonLitaker M.Ed [employer] IndustriesCCC-SLP Pager [phone]

## 2015-03-04 ENCOUNTER — Encounter (HOSPITAL_COMMUNITY): Payer: Self-pay | Admitting: Emergency Medicine

## 2015-03-04 ENCOUNTER — Emergency Department (HOSPITAL_COMMUNITY)
Admission: EM | Admit: 2015-03-04 | Discharge: 2015-03-16 | Disposition: E | Payer: Medicare Other | Attending: Emergency Medicine | Admitting: Emergency Medicine

## 2015-03-04 DIAGNOSIS — R2241 Localized swelling, mass and lump, right lower limb: Secondary | ICD-10-CM | POA: Insufficient documentation

## 2015-03-04 DIAGNOSIS — I10 Essential (primary) hypertension: Secondary | ICD-10-CM | POA: Diagnosis not present

## 2015-03-04 DIAGNOSIS — I2510 Atherosclerotic heart disease of native coronary artery without angina pectoris: Secondary | ICD-10-CM | POA: Insufficient documentation

## 2015-03-04 DIAGNOSIS — Z8673 Personal history of transient ischemic attack (TIA), and cerebral infarction without residual deficits: Secondary | ICD-10-CM | POA: Diagnosis not present

## 2015-03-04 DIAGNOSIS — M1990 Unspecified osteoarthritis, unspecified site: Secondary | ICD-10-CM | POA: Diagnosis not present

## 2015-03-04 DIAGNOSIS — Z8669 Personal history of other diseases of the nervous system and sense organs: Secondary | ICD-10-CM | POA: Insufficient documentation

## 2015-03-04 DIAGNOSIS — Z7982 Long term (current) use of aspirin: Secondary | ICD-10-CM | POA: Diagnosis not present

## 2015-03-04 DIAGNOSIS — Z79899 Other long term (current) drug therapy: Secondary | ICD-10-CM | POA: Diagnosis not present

## 2015-03-04 DIAGNOSIS — R2242 Localized swelling, mass and lump, left lower limb: Secondary | ICD-10-CM | POA: Diagnosis not present

## 2015-03-04 DIAGNOSIS — R011 Cardiac murmur, unspecified: Secondary | ICD-10-CM | POA: Insufficient documentation

## 2015-03-04 DIAGNOSIS — F319 Bipolar disorder, unspecified: Secondary | ICD-10-CM | POA: Insufficient documentation

## 2015-03-04 DIAGNOSIS — Z86711 Personal history of pulmonary embolism: Secondary | ICD-10-CM | POA: Diagnosis not present

## 2015-03-04 DIAGNOSIS — Z720 Tobacco use: Secondary | ICD-10-CM | POA: Diagnosis not present

## 2015-03-04 DIAGNOSIS — I469 Cardiac arrest, cause unspecified: Secondary | ICD-10-CM | POA: Insufficient documentation

## 2015-03-04 DIAGNOSIS — Z950 Presence of cardiac pacemaker: Secondary | ICD-10-CM | POA: Diagnosis not present

## 2015-03-04 HISTORY — DX: Heart failure, unspecified: I50.9

## 2015-03-04 MED ORDER — EPINEPHRINE HCL 0.1 MG/ML IJ SOSY
PREFILLED_SYRINGE | INTRAMUSCULAR | Status: AC | PRN
  Administered 2015-03-04: 1 mg via INTRAVENOUS

## 2015-03-05 MED FILL — Medication: Qty: 1 | Status: AC

## 2015-03-05 NOTE — Progress Notes (Signed)
Pt arrive DOA. Pt's caregiver was informed by Dr of the deceased . Chaplain assisted   discussion with caregiver and escorted caregiver to view body and to spend time with deceased. Chaplain gave care give card for follow up.    03/05/15 0500  Clinical Encounter Type  Visited With Family  Visit Type Spiritual support  Referral From Care management  Spiritual Encounters  Spiritual Needs Grief support

## 2015-03-16 NOTE — ED Provider Notes (Signed)
CSN: [id number]     Arrival date & time 2015/04/03  2128 History   First MD Initiated Contact with Patient 04/03/15 2142     Chief Complaint  Patient presents with  . Cardiac Arrest   (Consider location/radiation/quality/duration/timing/severity/associated sxs/prior Treatment) Patient is a 59 y.o. female presenting with general illness. The history is provided by the patient. No language interpreter was used.  Illness Location:  Cardiac arrest Severity:  Severe Onset quality:  Sudden Timing:  Constant Progression:  Worsening Chronicity:  New Relieved by:  Nothing Worsened by:  Nothing Ineffective treatments:  ACLS treatment  Risk factors:  PE, CAD, CHF   Past Medical History  Diagnosis Date  . Arthritis   . Hypertension   . Nerve damage   . Osteoporosis   . Hearing loss   . Abdominal pain   . Stroke     TIA's  . Bradycardia, sinus, persistent, severe   . 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 Bipolar 1 disorder   . Heart murmur 08/13/2012    EF 60-65%,severe LVH,Mild AOV regurg, MR,trivial TR  . Mild carotid artery disease 05/05/2005,08/13/2012    left ICA 0-49% diameter reduction 2006; bilateral fibrous plaque mild 2013  . LVH (left ventricular hypertrophy)   . CHF (congestive heart failure)    Past Surgical History  Procedure Laterality Date  . Cholecystectomy  1980  . Appendectomy  1983  . Total knee arthroplasty  2003    left  . Permanent pacemaker insertion  01/01/2005    Medtronic EnRhythm  . Permanent pacemaker generator change  06/08/2011    Medtronic Adapta  . Tee without cardioversion  01/06/2005    no cardiac source of embolus   No family history on file. History  Substance Use Topics  . Smoking status: Current Every Day Smoker -- 0.50 packs/day    Types: Cigarettes  . Smokeless tobacco: Not on file  . Alcohol Use: No   OB History    No data available     Review of Systems  Unable  to perform ROS: Acuity of condition      Allergies  Floxin; Plavix; and Epinephrine  Home Medications   Prior to Admission medications   Medication Sig Start Date End Date Taking? Authorizing Provider  acetaminophen (TYLENOL) 500 MG tablet Take 500 mg by mouth every 6 (six) hours as needed. For pain 01/02/15   Historical Provider, MD  aspirin EC 81 MG tablet Take 1 tablet (81 mg total) by mouth daily. 01/29/15   Osvaldo Shipper, MD  divalproex (DEPAKOTE ER) 500 MG 24 hr tablet Take 1 tablet (500 mg total) by mouth 2 (two) times daily. 01/29/15   Osvaldo Shipper, MD  feeding supplement, ENSURE ENLIVE, (ENSURE ENLIVE) LIQD Take 237 mLs by mouth 3 (three) times daily between meals. 01/29/15   Osvaldo Shipper, MD  lidocaine (XYLOCAINE) 2 % solution Take 10 mLs by mouth every 3 (three) hours as needed for mouth pain.  01/02/15   Historical Provider, MD  Multiple Vitamins-Minerals (MULTIVITAMIN & MINERAL PO) Take 1 tablet by mouth every morning.    Historical Provider, MD  OLANZapine (ZYPREXA) 5 MG tablet Take 5 mg by mouth at bedtime. 01/02/15   Historical Provider, MD  omeprazole (PRILOSEC) 20 MG capsule Take 20 mg by mouth every morning.    Historical Provider, MD  saccharomyces boulardii (FLORASTOR) 250 MG capsule Take 250 mg by mouth 2 (two) times daily.    Historical Provider,  MD   Wt 193 lb (87.544 kg) Physical Exam  Constitutional: She appears distressed.  HENT:  Head: Normocephalic and atraumatic.  Nose: Nose normal.  Mouth/Throat: Oropharynx is clear and moist. No oropharyngeal exudate.  King airway in place  Eyes: EOM are normal. Pupils are equal, round, and reactive to light.  Neck: Normal range of motion. Neck supple.  Cardiovascular: Normal rate, regular rhythm, normal heart sounds and intact distal pulses.   No murmur heard. Pulmonary/Chest: No respiratory distress. She has no wheezes. She exhibits no tenderness.  No spontaneous respirations   Abdominal: Soft. There is no rebound  and no guarding.  Musculoskeletal: Normal range of motion. She exhibits edema (LLE with 3+ pitting edema, RLE with 1+ pitting edema).  Skin: Skin is warm and dry. She is not diaphoretic.  Diffuse bruising over abdomen, likely c/w lovenox administration   Nursing note and vitals reviewed.   ED Course  Procedures (including critical care time)    EMERGENCY DEPARTMENT US CARDIAC EXAM "Study: Limited Ultrasound of the heart and pericardium"  INDICATIONS:Cardiac arrest Multiple views of the heart and pericardium are obtained with a multi-frequency probe.  PERFORMED ZO:XWRUEABY:Myself  IMAGES ARCHIVED?: Yes  FINDINGS: No cardiac activity  LIMITATIONS:  Body habitus and Emergent procedure  VIEWS USED: Subcostal 4 chamber, Parasternal short axis and Apical 4 chamber   INTERPRETATION: Cardiac activity absent   COMMENT:  No cardiac activity seen    Labs Review Labs Reviewed - No data to display  Imaging Review No results found.   EKG Interpretation None      MDM   Final diagnoses:  Cardiac arrest   Pt is a 59 yo F with known hx of PE, CAD who presents from a SNF as CPR in progress.   Last seen 10 minutes before, then found unresponsive in bed without pulses.  CPR initiated by SNF then EMS.  Reportedly in PEA throughout entire code, with rate between 40-90.  Initial End tidal CO2 in 30s then dropped down to 15 over the effort.  Received 10 doses of epinephrine, D50, had king airway placed, and IO in right tibia.  EMS efforts were continued due to report of PEA with good heart rate so further evaluation could occur in the ED.  On arrival, the lucas machine was changed to manual CPR due to the machine running out of batteries soon after arrival.  She received another dose of epinephrine.  [facility] and O2 sats were as low as the 60% despite using PEEP valve.   On pulse/rhythm check, patient was found to be in asystole.  POCUS echo showed no cardiac movement.   She had  undergone prolonged ACLS without improvement, had no noted cardiac activity, and no other alternative life saving measures were considered likely to improve her status, so time of death was called at 2132.   Every indication that patient's death was natural, so she was not made a medical examiner case.    Sister in law (POA), Marta AntuLeighann Tuggle was informed of the patient's death by myself and [facility].  All questions were answered.   Already has funeral arrangements set up.     Patient was seen with ED Attending, Dr. Ginette PitmanWalden  Keyle Doby, MD    Lenell AntuJamie Charika Mikelson, MD 03/05/15 [id number]  Elwin MochaBlair Walden, MD 03/05/15 [phone]

## 2015-03-16 NOTE — ED Notes (Addendum)
Per EMS [facility] staff found pt on floor 10 minutes after checking on her (per staff - at that time, she was fine).  CPR started at 2023, pt originally thought to be in PEA the entire time (pt has pacemaker), IO in R leg, King airway placed by EMS.  Pt had vomitus all around her when found - EMS suctioned 20-30cc blood and another 100cc vomitus.  Pt given 11 epi, 25 of D50, 4mg  Narcan.  Capnography originally at 30 - dropped to 15.  CPR in progress. HX CHF, HTN.

## 2015-03-16 NOTE — ED Notes (Signed)
Time of death called at 2132 by Dr. Gwendolyn GrantWalden, EDP.

## 2015-03-16 DEATH — deceased

## 2016-09-14 IMAGING — CR DG CHEST 1V PORT
1 series · 1 of 1 positions shown · non-contrast
Comparison: Same day.

CLINICAL DATA: Central line placement.

EXAM:
PORTABLE CHEST - 1 VIEW

[AP]
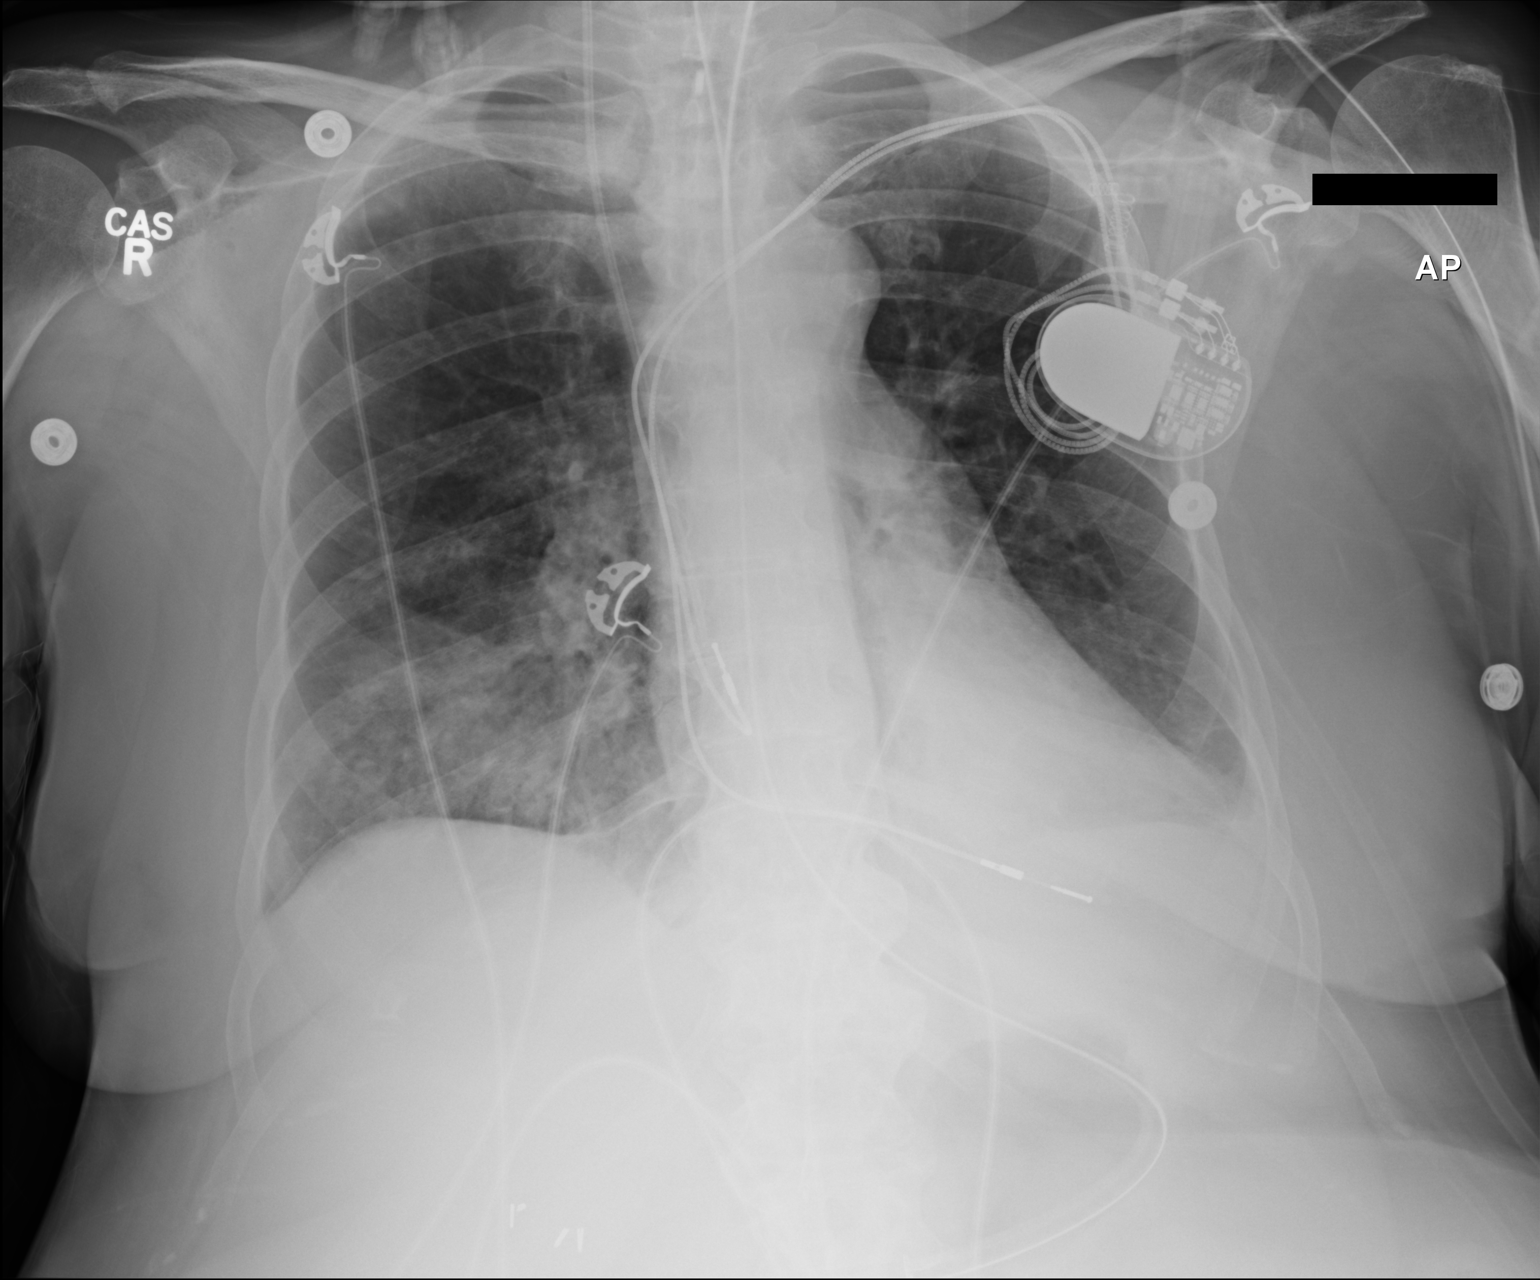

[1 of 1 positions shown; findings below may reference images not displayed]

FINDINGS: Stable cardiomediastinal silhouette. Endotracheal and nasogastric
tubes are in grossly good position. Left-sided pacemaker is
unchanged in position. Interval placement of right internal jugular
catheter line is noted with distal tip in expected position of the
cavoatrial junction. No pneumothorax is noted. Left lung is clear.
Right basilar opacity is again noted concerning for pneumonia or
atelectasis. No significant pleural effusion is noted.
IMPRESSION: Interval placement of right internal jugular catheter is noted with
distal tip in expected position of cavoatrial junction. No
pneumothorax is noted. Stable right basilar opacity is noted
concerning for pneumonia or atelectasis.

## 2016-09-14 IMAGING — CR DG CHEST 1V PORT
1 series · 1 of 1 positions shown · non-contrast
Comparison: 08/04/2014

CLINICAL DATA: Respiratory failure endotracheal tube placement

EXAM:
PORTABLE CHEST - 1 VIEW

[ap]
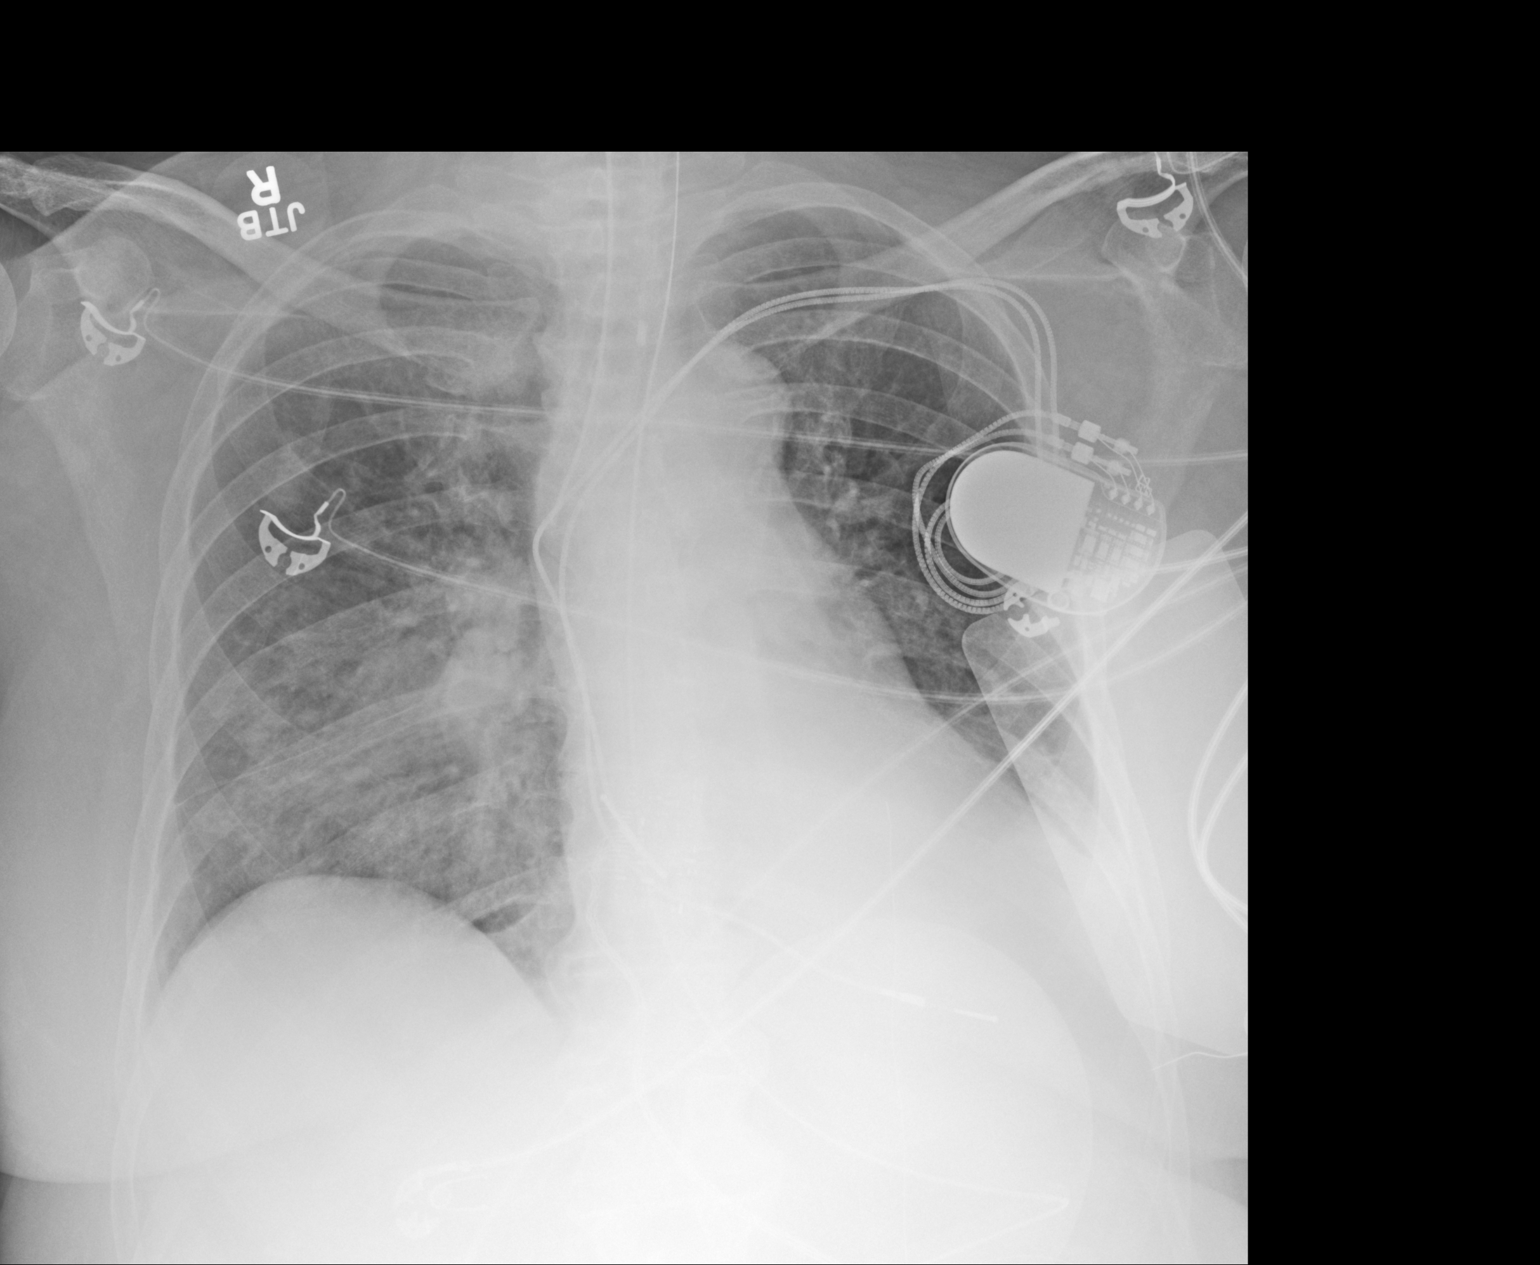

[1 of 1 positions shown; findings below may reference images not displayed]

FINDINGS: Cardiomegaly again noted. Dual lead cardiac pacemaker is unchanged
in position. Hazy right infrahilar atelectasis or infiltrate. There
is NG tube in place. Endotracheal tube with tip at the level of
carina towards right main bronchus. Endotracheal tube should be
retracted about 1.5 cm. There is no pneumothorax.
IMPRESSION: Dual lead cardiac pacemaker is unchanged in position. Hazy right
infrahilar atelectasis or infiltrate. There is NG tube in place.
Endotracheal tube with tip at the level of carina towards right main
bronchus. Endotracheal tube should be retracted about 1.5 cm. There
is no pneumothorax.

These results were called by telephone at the time of interpretation
on 01/07/2015 at [DATE] to Dr. HADASSA APODI , who verbally
acknowledged these results.

## 2016-09-15 IMAGING — CR DG CHEST 1V PORT
1 series · 1 of 1 positions shown · non-contrast
Comparison: Portable chest x-ray January 07, 2015

CLINICAL DATA: Respiratory failure, sepsis, current tobacco use

EXAM:
PORTABLE CHEST - 1 VIEW

[AP]
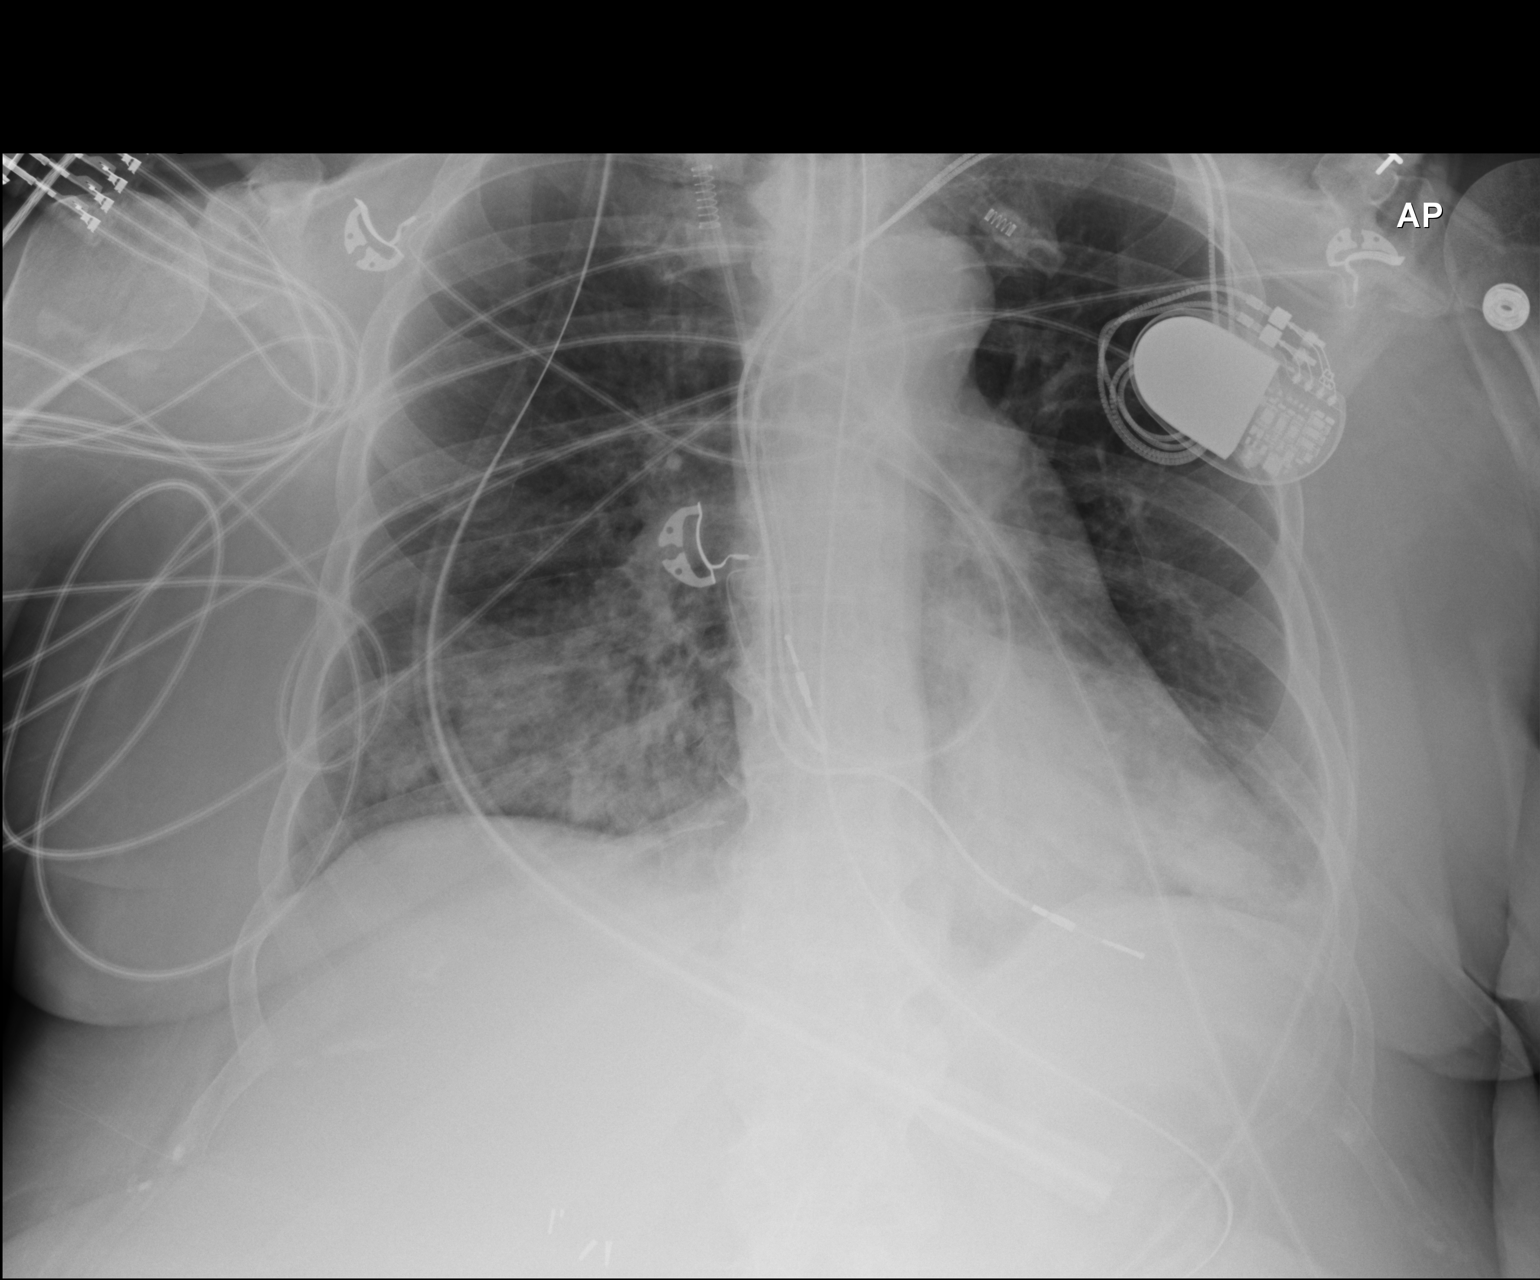

[1 of 1 positions shown; findings below may reference images not displayed]

FINDINGS: The lungs are adequately inflated. There is persistent interstitial
and alveolar opacity in the right lower lobe. Persistent increased
retrocardiac density is demonstrated but slight improved aeration
present here. The cardiac silhouette is normal in size. The
pulmonary vascularity is not engorged. The endotracheal tube tip
lies 2.8 cm above the carina. The esophagogastric tube tip projects
below the inferior margin of the image. The right internal jugular
venous catheter tip projects over the midportion of the SVC. The
permanent pacemaker is in reasonable position. The bony thorax
exhibits no acute abnormalities.
IMPRESSION: Persistent bibasilar atelectasis and/or pneumonia. Slight interval
improvement in the left retrocardiac region has occurred. The
support tubes and lines are in reasonable position.

## 2016-09-16 IMAGING — CR DG CHEST 1V PORT
1 series · 1 of 1 positions shown · non-contrast
Comparison: 01/08/2015.

CLINICAL DATA: Pneumonia.

EXAM:
PORTABLE CHEST - 1 VIEW

[AP]
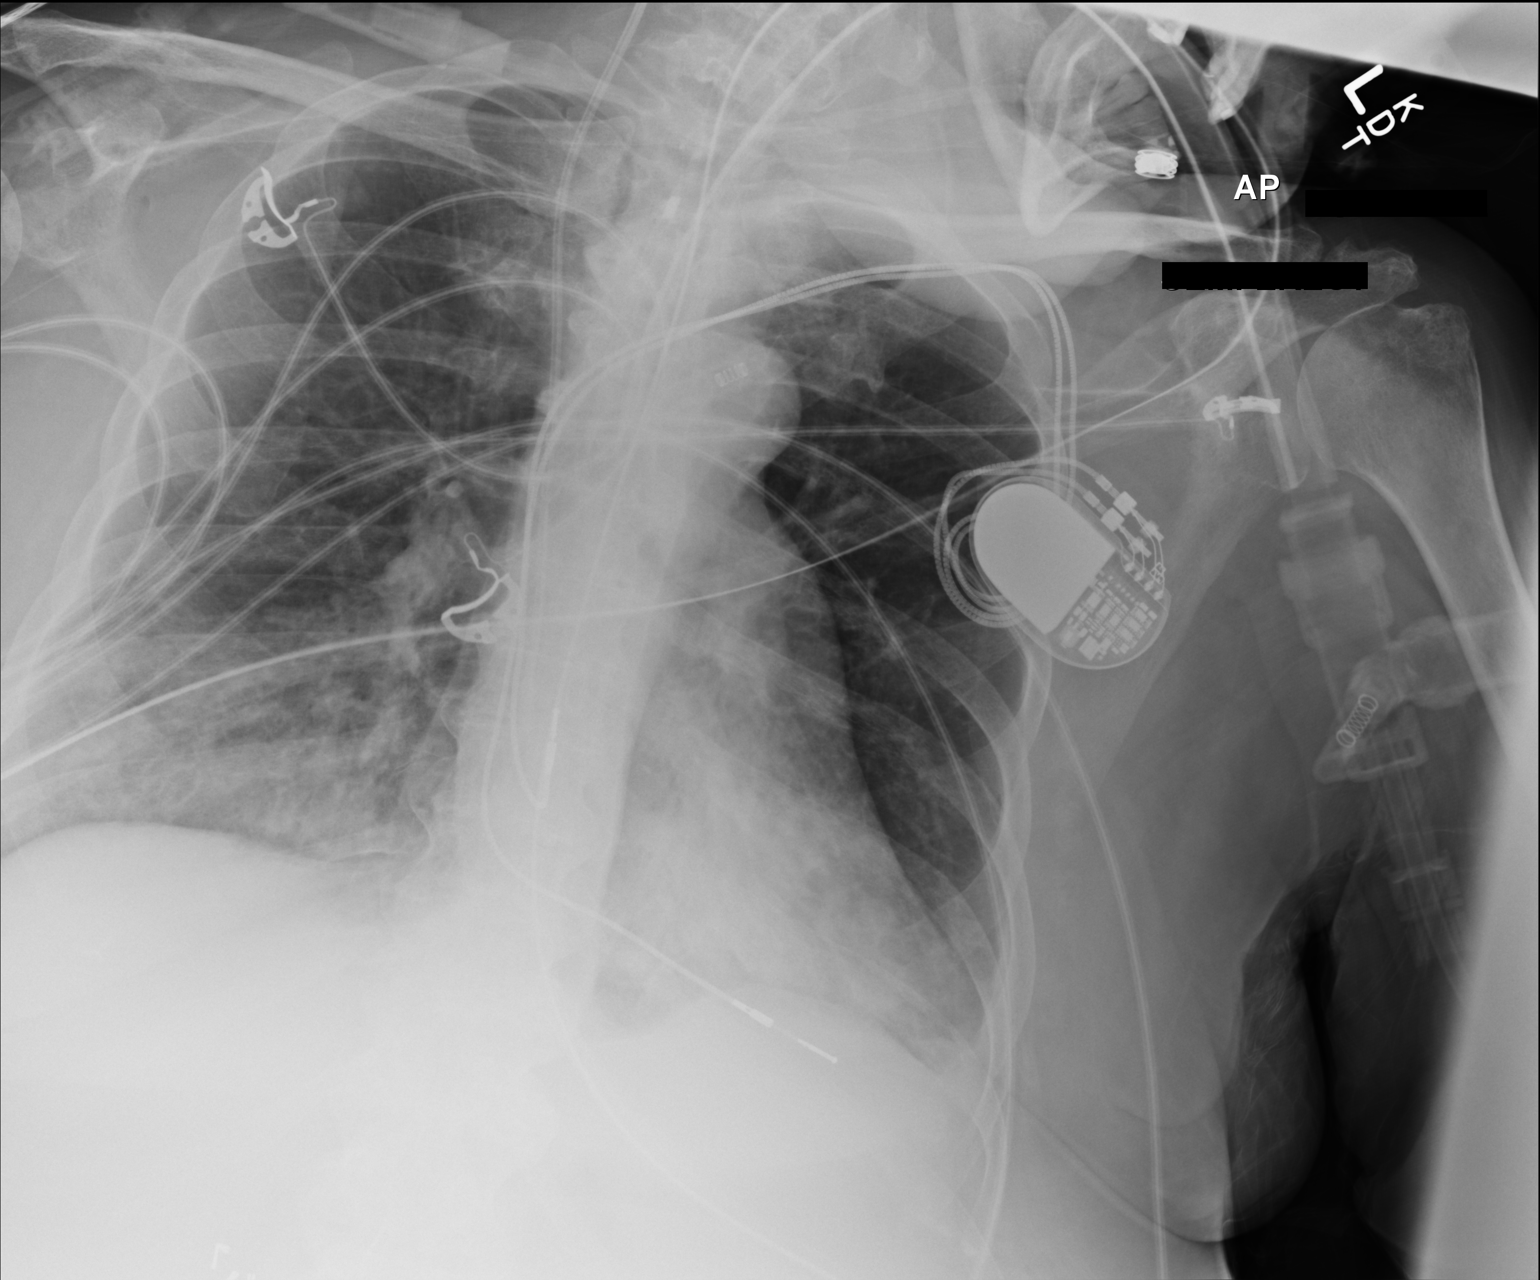

[1 of 1 positions shown; findings below may reference images not displayed]

FINDINGS: Endotracheal tube, NG tube, right IJ line in stable position.
Mediastinum hilar structures are stable. Cardiac pacer lead tips in
right atrium right ventricle. Heart size stable. The persistent
unchanged prominent bibasilar pulmonary infiltrates consistent with
pneumonia. No pleural effusion or pneumothorax.
IMPRESSION: 1.  Lines and tubes in stable position.

2. Persistent prominent bilateral lower lobe pulmonary infiltrates
consistent with pneumonia.

## 2016-09-18 IMAGING — CR DG CHEST 1V PORT
1 series · 1 of 1 positions shown · non-contrast
Comparison: 01/10/2015

CLINICAL DATA: Acute respiratory failure

EXAM:
PORTABLE CHEST - 1 VIEW

[AP]
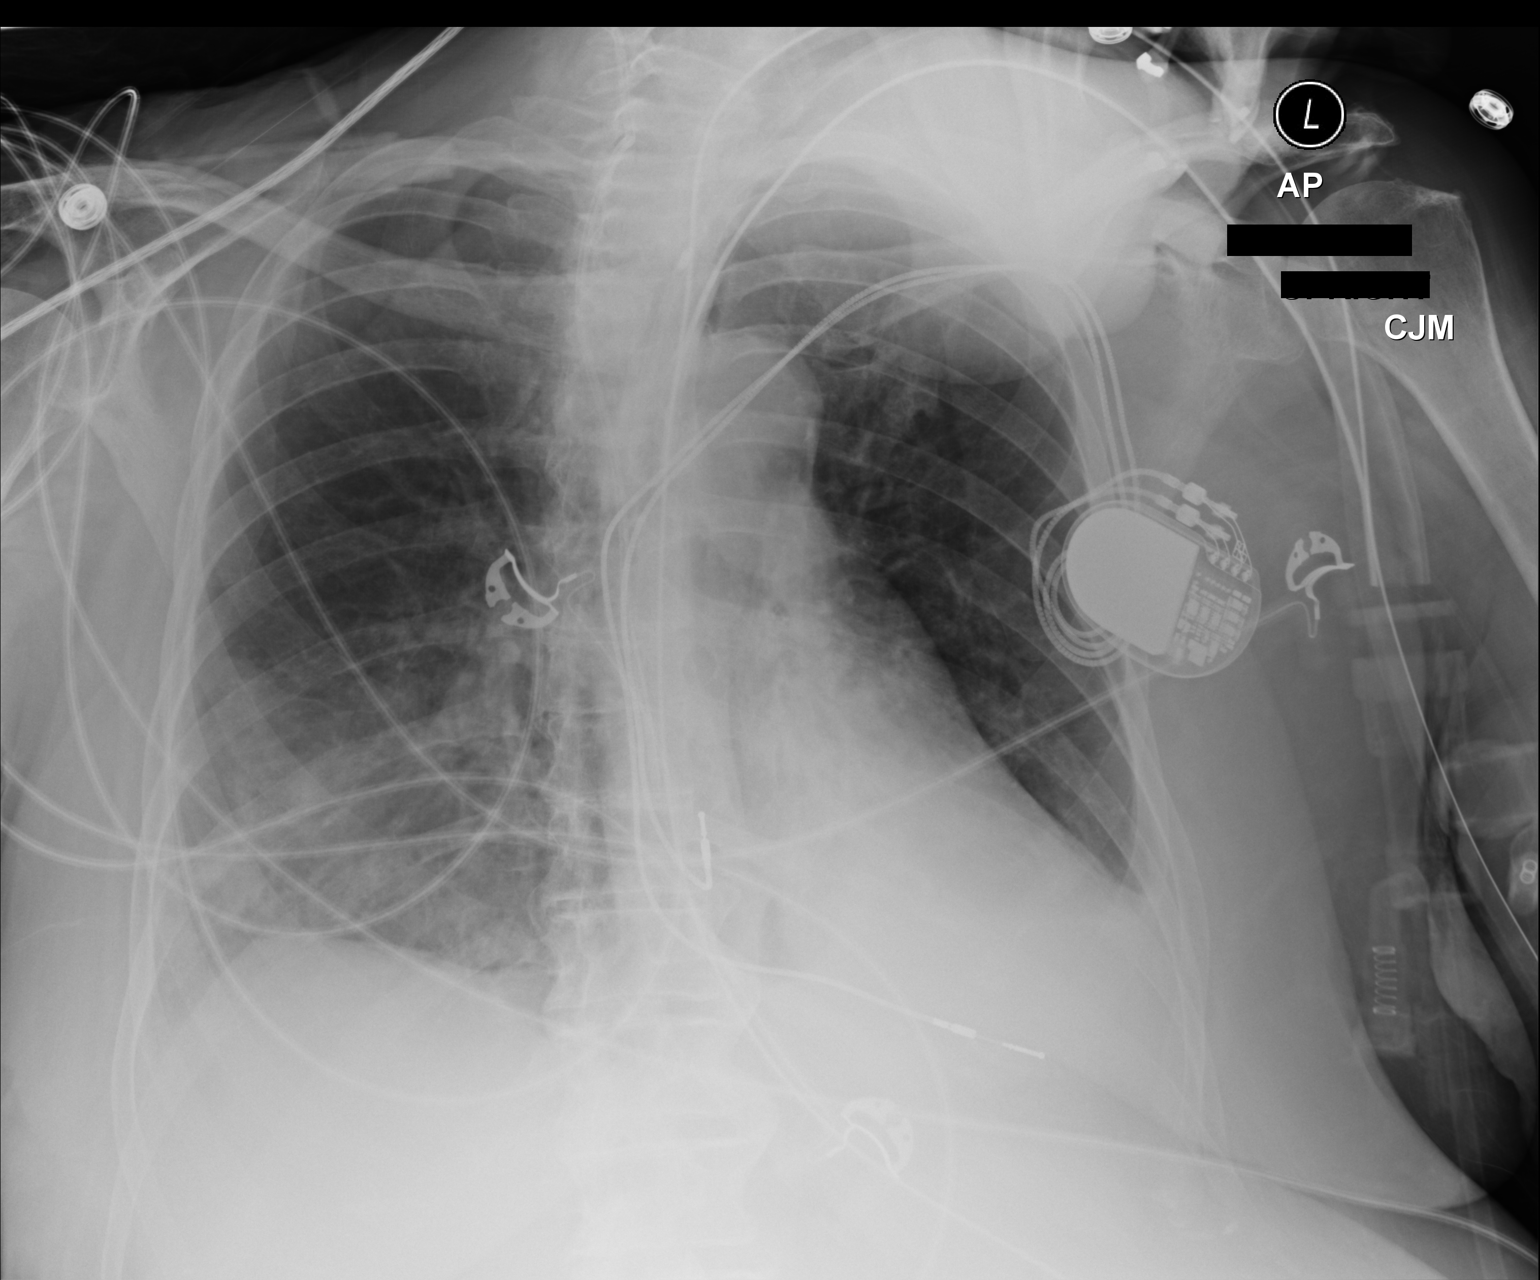

[1 of 1 positions shown; findings below may reference images not displayed]

FINDINGS: Bibasilar airspace opacities are stable. There are no new lung
abnormalities.

No pneumothorax.  Probable small effusions.

Endotracheal tube tip is not well defined on this exam, but appears
to project approximate 5 mm above the carina.

Orogastric tube and right internal jugular central venous line are
stable and well positioned.
IMPRESSION: 1. No change from the previous day's study.
2. Persistent basilar airspace opacity which may reflect pneumonia,
atelectasis or a combination. No evidence of pulmonary edema.
3. Support apparatus stable position.

## 2017-05-16 LAB — HM COLONOSCOPY

## 2017-08-28 ENCOUNTER — Inpatient Hospital Stay
Admit: 2017-08-28 | Discharge: 2017-08-28 | Disposition: A | Discharge status: Home / Self Care | Payer: Self-pay | Attending: Emergency Medicine

## 2017-08-28 ENCOUNTER — Emergency Department: Admit: 2017-08-28 | Payer: Self-pay | Primary: Family

## 2017-08-28 DIAGNOSIS — M109 Gout, unspecified: Secondary | ICD-10-CM

## 2017-08-28 MED ORDER — COLCHICINE 0.6 MG TAB
0.6 mg | Freq: Once | ORAL | Status: AC
Start: 2017-08-28 — End: 2017-08-28
  Administered 2017-08-28: 22:00:00 via ORAL

## 2017-08-28 MED ORDER — COLCHICINE 0.6 MG TAB
0.6 mg | ORAL_TABLET | Freq: Every day | ORAL | 0 refills | Status: AC
Start: 2017-08-28 — End: 2017-09-12

## 2017-08-28 MED FILL — COLCRYS 0.6 MG TABLET: 0.6 mg | ORAL | Qty: 2

## 2017-08-28 NOTE — ED Notes (Signed)
Emergency Department Nursing Plan of Care       The Nursing Plan of Care is developed from the Nursing assessment and Emergency Department Attending provider initial evaluation.  The plan of care may be reviewed in the ???ED Provider note???.    The Plan of Care was developed with the following considerations:   Patient / Family readiness to learn indicated TG:GYIRSWNIOE understanding  Persons(s) to be included in education: patient  Barriers to Learning/Limitations:No    Signed     DENIAH SAIA, RN    08/28/2017   5:53 PM

## 2017-08-28 NOTE — ED Provider Notes (Signed)
EMERGENCY DEPARTMENT HISTORY AND PHYSICAL EXAM    Date: 08/28/2017  Patient Name: Suzanne Sherman    History of Presenting Illness     Chief Complaint   Patient presents with   ??? Foot Pain     right great toe pain and redness ( looks like gout)         History Provided By: Patient      HPI: Suzanne Sherman is a 62 y.o. female with a PMH of hypertension and hyperlipidemia who presents with acute moderate aching Rt great toe pain, swelling, redness x 1 day. Pt states she woke up with symptoms this morning. NKI or falls. Denies hx of arthritis or gout. Pain worse with palpation and ambulation. No medications. Denies fever, chills, n/v, wound, numbness/tingling, LROM.    PCP: Rica Records, MD    Current Outpatient Medications   Medication Sig Dispense Refill   ??? mirtazapine (REMERON) 15 mg tablet TAKE ONE TABLET BY MOUTH EVERY DAY AT BEDTIME  5   ??? colchicine 0.6 mg tablet Take 1 Tab by mouth daily for 15 days. 15 Tab 0   ??? hydrochlorothiazide (HYDRODIURIL) 25 mg tablet Take 25 mg by mouth daily.     ??? POTASSIUM CHLORIDE (K-LOR PO) Take  by mouth.     ??? aspirin 81 mg chewable tablet Take 81 mg by mouth daily.     ??? metoprolol (LOPRESSOR) 50 mg tablet Take  by mouth two (2) times a day.     ??? buPROPion SR (WELLBUTRIN SR) 150 mg SR tablet TAKE ONE TABLET BY MOUTH EVERY DAY IN THE MORNING FOR 3 DAYS , THEN TAKE ONE TABLET BY MOUTH EVERY DAY IN THE MORNING AND TAKE ONE TABLET BY  2   ??? busPIRone (BUSPAR) 10 mg tablet TK 1 T PO BID PRF ANXIETY  3   ??? atorvastatin (LIPITOR) 20 mg tablet Take  by mouth daily.         Past History     Past Medical History:  Past Medical History:   Diagnosis Date   ??? HBP (high blood pressure) 10/27/2008   ??? High cholesterol 10/27/2008       Past Surgical History:  History reviewed. No pertinent surgical history.    Family History:  History reviewed. No pertinent family history.    Social History:  Social History     Tobacco Use   ??? Smoking status: Current Every Day Smoker    ??? Smokeless tobacco: Never Used   Substance Use Topics   ??? Alcohol use: No     Frequency: Never   ??? Drug use: No       Allergies:  No Known Allergies      Review of Systems   Review of Systems   Constitutional: Negative for activity change, appetite change, chills, diaphoresis, fatigue and fever.   HENT: Negative.    Eyes: Negative.    Respiratory: Negative.  Negative for cough and shortness of breath.    Cardiovascular: Negative.  Negative for chest pain and leg swelling.   Gastrointestinal: Negative.  Negative for abdominal pain, diarrhea, nausea and vomiting.   Genitourinary: Negative.    Musculoskeletal: Positive for arthralgias and joint swelling. Negative for back pain and neck pain.   Skin: Positive for color change. Negative for pallor and wound.   Neurological: Negative.  Negative for numbness.   Psychiatric/Behavioral: Negative.        Physical Exam     Vitals:    08/28/17 0932  BP: 158/61   Pulse: (!) 107   Resp: 16   Temp: 98 ??F (36.7 ??C)   SpO2: 100%   Weight: 63.5 kg (140 lb)   Height: 5\' 4"  (1.626 m)     Physical Exam   Constitutional: She is oriented to person, place, and time. She appears well-developed and well-nourished. No distress.   HENT:   Head: Normocephalic and atraumatic.   Right Ear: Hearing and external ear normal.   Left Ear: Hearing and external ear normal.   Nose: Nose normal.   Eyes: Conjunctivae and EOM are normal. Pupils are equal, round, and reactive to light.   Neck: Normal range of motion.   Cardiovascular:   Pulses:       Dorsalis pedis pulses are 2+ on the right side, and 2+ on the left side.   Pulmonary/Chest: Effort normal. No respiratory distress.   Musculoskeletal: Normal range of motion.        Left ankle: Normal.        Right foot: There is tenderness, bony tenderness (Rt 1st MTP. +warmth. No fluctunace, induration, wound, streaking. Cap refill <3 sec. NVI.) and swelling. There is normal range of motion, normal capillary  refill, no crepitus, no deformity and no laceration.        Left foot: Normal.   Neurological: She is alert and oriented to person, place, and time.   Skin: Skin is warm, dry and intact. She is not diaphoretic. There is erythema.   Psychiatric: She has a normal mood and affect. Her behavior is normal. Judgment and thought content normal.   Nursing note and vitals reviewed.        Diagnostic Study Results     Labs -   No results found for this or any previous visit (from the past 12 hour(s)).    Radiologic Studies -   XR FOOT RT MIN 3 V   Final Result   IMPRESSION:        Edema at the first MTP joint. Suggestion of minimal erosive change.    Consider erosive arthropathy such as gout.        CT Results  (Last 48 hours)    None        CXR Results  (Last 48 hours)    None            Medical Decision Making   I am the first provider for this patient.    I reviewed the vital signs, available nursing notes, past medical history, past surgical history, family history and social history.    Vital Signs-Reviewed the patient's vital signs.    Records Reviewed: Nursing Notes, Old Medical Records and Previous Radiology Studies            Disposition:    DISCHARGE NOTE:   5:50 PM      Care plan outlined and precautions discussed.  Patient has no new complaints, changes, or physical findings.  Results of exam, xray were reviewed with the patient. All medications were reviewed with the patient; will d/c home with colchicene. All of pt's questions and concerns were addressed. Patient was instructed and agrees to follow up with PCP/Podiatry, as well as to return to the ED upon further deterioration. Patient is ready to go home.    Follow-up Information     Follow up With Specialties Details Why Contact Info    Rica Records, MD [facility] Schedule an appointment as soon as possible for a visit in 3 days If  symptoms worsen, As needed [address]  [facility]  [place] [address]  [phone]             Current Discharge Medication List      START taking these medications    Details   colchicine 0.6 mg tablet Take 1 Tab by mouth daily for 15 days.  Qty: 15 Tab, Refills: 0         CONTINUE these medications which have NOT CHANGED    Details   mirtazapine (REMERON) 15 mg tablet TAKE ONE TABLET BY MOUTH EVERY DAY AT BEDTIME  Refills: 5      hydrochlorothiazide (HYDRODIURIL) 25 mg tablet Take 25 mg by mouth daily.      POTASSIUM CHLORIDE (K-LOR PO) Take  by mouth.      aspirin 81 mg chewable tablet Take 81 mg by mouth daily.      metoprolol (LOPRESSOR) 50 mg tablet Take  by mouth two (2) times a day.      buPROPion SR (WELLBUTRIN SR) 150 mg SR tablet TAKE ONE TABLET BY MOUTH EVERY DAY IN THE MORNING FOR 3 DAYS , THEN TAKE ONE TABLET BY MOUTH EVERY DAY IN THE MORNING AND TAKE ONE TABLET BY  Refills: 2      busPIRone (BUSPAR) 10 mg tablet TK 1 T PO BID PRF ANXIETY  Refills: 3      atorvastatin (LIPITOR) 20 mg tablet Take  by mouth daily.             Provider Notes (Medical Decision Making):   DDX: gout, arthritis, sprain, strain, bunion, callus, fx, dislocation (unlikely nki)  Pt presents with acute Rt great toe MTP pain and swelling x 1 day. NKI. Suspect gout. Will xray to look for significant arthritis or other joint abnormalities. Tx w/ colchicine and follow upw with Podiatry/ PCP prn.     Procedures:  Procedures        Diagnosis     Clinical Impression:   1. Gouty arthritis of right great toe

## 2017-08-28 NOTE — ED Notes (Signed)
Discharged by provider.

## 2019-08-13 ENCOUNTER — Encounter

## 2019-10-08 ENCOUNTER — Inpatient Hospital Stay: Attending: Internal Medicine | Primary: Family Medicine

## 2019-10-08 ENCOUNTER — Ambulatory Visit: Primary: Family Medicine

## 2019-11-26 ENCOUNTER — Encounter

## 2019-11-27 ENCOUNTER — Ambulatory Visit: Payer: MEDICARE | Primary: Family Medicine

## 2019-11-27 ENCOUNTER — Inpatient Hospital Stay: Payer: MEDICARE | Attending: Internal Medicine | Primary: Family Medicine

## 2020-02-13 ENCOUNTER — Inpatient Hospital Stay
Admit: 2020-02-13 | Discharge: 2020-02-13 | Disposition: A | Discharge status: Home / Self Care | Payer: MEDICARE | Attending: Emergency Medicine

## 2020-02-13 DIAGNOSIS — F411 Generalized anxiety disorder: Secondary | ICD-10-CM

## 2020-02-13 MED ORDER — HYDROXYZINE 50 MG TAB
50 mg | ORAL_TABLET | Freq: Four times a day (QID) | ORAL | 0 refills | Status: AC | PRN
Start: 2020-02-13 — End: 2020-02-23

## 2020-02-13 NOTE — ED Notes (Signed)
Discharge instructions were given to the patient by Holly H West.     The patient left the Emergency Department ambulatory, alert and oriented and in no acute distress with 1 prescription. The patient was encouraged to call or return to the ED for worsening issues or problems and was encouraged to schedule a follow up appointment for continuing care.     The patient verbalized understanding of discharge instructions and prescriptions, all questions were answered. The patient has no further concerns at this time.

## 2020-02-13 NOTE — ED Provider Notes (Signed)
ED Provider Notes by Vernell Morgans, MD at 02/13/20 [phone]                Author: Vernell Morgans, MD  Service: EMERGENCY  Author Type: Physician       Filed: 02/13/20 0921  Date of Service: 02/13/20 0918  Status: Signed          Editor: Vernell Morgans, MD (Physician)               EMERGENCY DEPARTMENT HISTORY AND PHYSICAL EXAM           Date: 02/13/2020   Patient Name: Suzanne Sherman   Patient Age and Sex: 64 y.o.  female         History of Presenting Illness          Chief Complaint       Patient presents with        ?  Tremors             beginning Sunday           History Provided By: Patient      HPI: Suzanne Sherman is a 64 year old  female presenting for tremors of her hands.  Patient states that for the last couple of days, since Sunday has been having tremors of her hands.  Patient states that she thinks it because she is feeling more anxious.  States that she has been taking BuSpar  for the past 2 years but feels right now that it is not enough for her anxiety.  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  Thinks that that  might be causing things to be worse.  Patient denies any trauma to her body, states the tremors are intermittent.  States that she feels very jittery and nervous inside.  Denies any chest pain or shortness of breath.      There are no other complaints, changes, or physical findings at this time.      PCP: Rica Records, MD        No current facility-administered medications on file prior to encounter.          Current Outpatient Medications on File Prior to Encounter          Medication  Sig  Dispense  Refill           ?  buPROPion SR (WELLBUTRIN SR) 150 mg SR tablet  TAKE ONE TABLET BY MOUTH EVERY DAY IN THE MORNING FOR 3 DAYS , THEN TAKE ONE TABLET BY MOUTH EVERY DAY IN THE MORNING AND TAKE ONE TABLET BY    2     ?  busPIRone (BUSPAR) 10 mg tablet  TK 1 T PO BID  PRF ANXIETY    3     ?  atorvastatin (LIPITOR) 20 mg tablet  Take  by mouth daily.         ?  hydrochlorothiazide (HYDRODIURIL) 25 mg tablet  Take 25 mg by mouth daily.         ?  POTASSIUM CHLORIDE (K-LOR PO)  Take  by mouth.         ?  aspirin 81 mg chewable tablet  Take 81 mg by mouth daily.         ?  metoprolol (LOPRESSOR) 50 mg tablet  Take  by mouth two (2) times  a day.               ?  mirtazapine (REMERON) 15 mg tablet  TAKE ONE TABLET BY MOUTH EVERY DAY AT BEDTIME (Patient not taking: Reported on 02/13/2020)    5             Past History        Past Medical History:     Past Medical History:        Diagnosis  Date         ?  HBP (high blood pressure)  10/27/2008         ?  High cholesterol  10/27/2008           Past Surgical History:   History reviewed. No pertinent surgical history.      Family History:   History reviewed. No pertinent family history.      Social History:     Social History          Tobacco Use         ?  Smoking status:  Current Every Day Smoker     ?  Smokeless tobacco:  Never Used       Substance Use Topics         ?  Alcohol use:  No         ?  Drug use:  No           Allergies:   No Known Allergies           Review of Systems     Review of Systems    Constitutional: Negative for chills and fever.    Respiratory: Negative for cough and shortness of breath.     Cardiovascular: Negative for chest pain.    Gastrointestinal: Negative for abdominal pain, constipation, diarrhea, nausea and vomiting.    Genitourinary: Negative for dysuria, frequency and hematuria.    Neurological: Positive for tremors. Negative for weakness and numbness.    Psychiatric/Behavioral: Negative for self-injury, sleep disturbance and suicidal ideas. The patient is nervous/anxious.     All other systems reviewed and are negative.            Physical Exam     Physical Exam   Constitutional :        General: She is not in acute distress.     Appearance: She is well-developed.    HENT:       Head: Normocephalic and  atraumatic.      Nose: Nose normal.      Mouth/Throat:      Mouth: Mucous membranes are moist.    Eyes:       Extraocular Movements: Extraocular movements intact.      Conjunctiva/sclera: Conjunctivae normal.   Cardiovascular:       Comments: Well perfused  Pulmonary:       Effort: Pulmonary effort is normal. No respiratory distress.   Musculoskeletal:          General: Normal range of motion.      Cervical back: Normal range of motion.     Neurological:       General: No focal deficit present.      Mental Status: She is alert and oriented to person, place, and time.      Comments: Patient ambulatory to the room without any difficulty.  When  talking to her, only when we are discussing her anxiety where she is thinking about her hands do her hand start  to tremor.  Otherwise when I am distracting her hands are not tremoring.  Has great grip strength, good dexterity of the fingers.  Has minor  tremors of the right hand more than the left.  When I have her hold out her hands, no asterixis.   Psychiatric:      Comments: Slightly anxious                 Diagnostic Study Results        Labs -    No results found for this or any previous visit (from the past 12 hour(s)).      Radiologic Studies -      No orders to display          CT Results   (Last 48 hours)          None                 CXR Results   (Last 48 hours)          None                       Medical Decision Making     I am the first provider for this patient.      I reviewed the vital signs, available nursing notes, past medical history, past surgical history, family history and social history.      Vital Signs-Reviewed the patient's vital signs.   Patient Vitals for the past 12 hrs:            Temp  Pulse  Resp  BP  SpO2            02/13/20 0906  98 ??F (36.7 ??C)  65  16  124/76  97 %           Records Reviewed: Nursing Notes and Old Medical Records      Provider Notes (Medical Decision Making):    Patient presenting with tremors of the hand.  Most likely all  anxiety related, no signs of electrolyte abnormalities, asterixis or any other acute medical pathology.      ED Course:    Initial assessment performed. The patients presenting problems have been discussed, and they are in agreement with the care plan formulated and outlined with them.  I have encouraged them to ask questions as they arise throughout their visit.          Critical Care Time:    0      Disposition:   Discharge Note:   The patient has been re-evaluated and is ready for discharge. Reviewed available results with patient. Counseled patient on diagnosis and care plan. Patient has expressed understanding, and all questions  have been answered. Patient agrees with plan and agrees to follow up as recommended, or to return to the ED if their symptoms worsen. Discharge instructions have been provided and explained to the patient, along with reasons to return to the ED.        PLAN:     Current Discharge Medication List              START taking these medications          Details        hydrOXYzine HCL (ATARAX) 50 mg tablet  Take 1 Tablet by mouth every six (6) hours as needed for Anxiety for up to 10 days.   Qty: 20 Tablet, Refills:  0   Start date: 02/13/2020,  End date:  02/23/2020                   1.     2.      Follow-up Information               Follow up With  Specialties  Details  Why  Contact Info              Psychiatry in August                     3.  Return to ED if worse         Diagnosis        Clinical Impression:       1.  Tremor of both hands         2.  Anxiety state            Attestations:      Vernell Morgans, M.D.              Please note that this dictation was completed with Dragon, the computer voice recognition software.  Quite often unanticipated grammatical, syntax, homophones, and other interpretive errors are inadvertently transcribed by the computer software.  Please  disregard these errors.  Please excuse any errors that have escaped final proofreading.  Thank you.

## 2020-02-13 NOTE — ED Notes (Signed)
 Pt presents ambulatory to ED complaining of intermittent shaking beginning Sunday.   Pt reports she has been taking Buspar for 2 years, prescribed by [facility]. Pt states he spoke with doctor at [facility] and she was referred to psychiatrist. Pt reports having an appt in August.  Pt states she believes it could be anxiety related. 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  Pt is alert and oriented x 4, RR even and unlabored, skin is warm and dry. Assesment completed and pt updated on plan of care.       Emergency Department Nursing Plan of Care       The Nursing Plan of Care is developed from the Nursing assessment and Emergency Department Attending provider initial evaluation.  The plan of care may be reviewed in the "ED Provider note".    The Plan of Care was developed with the following considerations:   Patient / Family readiness to learn indicated ab:czmajopszi understanding  Persons(s) to be included in education: patient  Barriers to Learning/Limitations:No    Signed     Silvano DEL [facility]    02/13/2020   9:12 AM

## 2020-03-10 ENCOUNTER — Ambulatory Visit: Payer: MEDICARE | Primary: Family Medicine

## 2020-03-10 ENCOUNTER — Inpatient Hospital Stay: Payer: MEDICARE | Attending: Internal Medicine | Primary: Family Medicine

## 2020-07-06 ENCOUNTER — Inpatient Hospital Stay: Payer: MEDICARE | Attending: Internal Medicine | Primary: Family Medicine

## 2020-07-06 ENCOUNTER — Ambulatory Visit: Payer: MEDICARE | Primary: Family Medicine

## 2020-09-24 ENCOUNTER — Encounter

## 2020-11-05 ENCOUNTER — Ambulatory Visit: Payer: MEDICARE | Primary: Family Medicine

## 2020-11-05 ENCOUNTER — Inpatient Hospital Stay: Payer: MEDICARE | Attending: Internal Medicine | Primary: Family Medicine

## 2020-12-23 ENCOUNTER — Encounter

## 2021-01-12 ENCOUNTER — Inpatient Hospital Stay: Admit: 2021-01-12 | Payer: MEDICARE | Attending: Family | Primary: Family

## 2021-01-12 DIAGNOSIS — N184 Chronic kidney disease, stage 4 (severe): Secondary | ICD-10-CM

## 2021-02-11 ENCOUNTER — Inpatient Hospital Stay: Payer: MEDICARE | Attending: Family | Primary: Family

## 2021-02-11 ENCOUNTER — Ambulatory Visit: Payer: MEDICARE | Primary: Family

## 2021-03-03 ENCOUNTER — Inpatient Hospital Stay: Admit: 2021-03-03 | Payer: MEDICARE | Attending: Family | Primary: Family

## 2021-03-03 DIAGNOSIS — M810 Age-related osteoporosis without current pathological fracture: Secondary | ICD-10-CM

## 2021-03-03 DIAGNOSIS — Z1231 Encounter for screening mammogram for malignant neoplasm of breast: Secondary | ICD-10-CM

## 2021-03-09 ENCOUNTER — Encounter

## 2021-04-14 ENCOUNTER — Inpatient Hospital Stay: Payer: MEDICARE | Attending: Family | Primary: Family

## 2021-05-25 ENCOUNTER — Encounter: Payer: MEDICARE | Attending: Family Medicine | Primary: Family

## 2021-07-22 ENCOUNTER — Encounter: Attending: Family Medicine | Primary: Family

## 2021-12-05 ENCOUNTER — Encounter

## 2021-12-06 ENCOUNTER — Encounter

## 2022-06-03 NOTE — Progress Notes (Signed)
Formatting of this note is different from the original.  Images from the original note were not included.    [facility] - Follow Up Visit    Name: Suzanne Sherman Patient ID: [id number]  Age: 66 y.o. Sex: female Date of Birth: October 18, 1955   Date of Service: 06/03/2022   Encounter Provider: Marquita Palms, MD     PCP: Elgie Collard, NP   Referring Provider: No ref. provider found     Reason for Visit / CC: follow up of CAD    ASSESSMENT   # CAD, no chest pain   NYHA Class I symptoms     Euvolemic  # HTN -uncontrolled before meds, better at home  # Dyslipidemia  # CKD 3   # tobacco abuse - quit smoking in July 2022    [place] today  - ECG today normal  - Continue aspirin, Lopressor, Lipitor   -LDL 93 (target <70).  She is already on Lipitor 80 mg.  Add Zetia 10 mg daily  - Continue antihypertensive regimen with amlodipine and lisinopril.  I did not increase his meds today for hypertension as she has not taken any meds prior to coming to the clinic.  Asked the patient to follow-up with the PCP.    FU in 12 month(s)    MEDICAL DECISION MAKING     Data Elements    - Tests/Labs Ordered: see plan / orders.    Management Risk   - Prescription Drugs: Yes; see med list / plan.     Problem List Items Addressed This Visit       Circulatory    HBP (high blood pressure)    Relevant Medications    amLODIPine (Norvasc) 10 MG tablet    Other Relevant Orders    Magnesium (Completed)    Comprehensive Metabolic Panel (Completed)    N-Terminal ProBNP (Completed)   Other Visit Diagnoses     ASHD (arteriosclerotic heart disease)    -  Primary    Relevant Medications    amLODIPine (Norvasc) 10 MG tablet    Other Relevant Orders    ECG 12 lead - Clinic Performed (Completed)    Hyperlipidemia, unspecified hyperlipidemia type        Relevant Medications    atorvastatin (Lipitor) 80 MG tablet    Other Relevant Orders    Lipid Panel (Completed)       _______________________________________________________  Past Medical History   #  CAD s/p MI/stent in 1996 and PTCA in 1997  - echo 03/2022, LVEF 55-60%, mild MR, LVIDD 4.4, RV 3.8, IVSd 0.9  # HTN  # depression/anxiety  # insomnia  # CKD stage 3    HISTORY OF PRESENT ILLNESS   Suzanne Sherman is a 66 y.o. female with CAD.   Followed by Dr. Hilma Favors in the past.     INTERVAL HISTORY   Last visit a year ago.  She did not have any emergency room visits or hospital admissions in the past year.    Denies chest pain no angina.  No dyspnea with activities.  No lightheadedness.  No leg swelling.    She had an echocardiogram in August that showed LVEF 55 to 60% and no significant diastolic abnormality.    Review of Systems   Constitutional: Negative for chills and fever.   Respiratory: Negative for cough.    Gastrointestinal: Negative for nausea and vomiting.   Genitourinary: Negative for dysuria.   All other systems  reviewed and are negative.    OBJECTIVE   Visit Vitals  BP 164/83 (BP Location: Right arm, Patient Position: Sitting, BP Cuff Size: Adult)   Pulse 67   Ht 1.626 m   Wt 59.2 kg   SpO2 100%   BMI 22.42 kg/m   Smoking Status Former   BSA 1.64 m     Physical Exam  Constitutional:       General: She is not in acute distress.  Cardiovascular:      Rate and Rhythm: Normal rate and regular rhythm.      Pulses: Normal pulses.      Heart sounds: No murmur heard.     No gallop.   Pulmonary:      Effort: Pulmonary effort is normal.      Breath sounds: Normal breath sounds.   Musculoskeletal:      Right lower leg: No edema.      Left lower leg: No edema.   Neurological:      General: No focal deficit present.      Mental Status: She is alert.   Psychiatric:         Mood and Affect: Mood normal.         Judgment: Judgment normal.         Allergies:   Patient has no known allergies.    MEDICATIONS   Current Outpatient Medications   Medication Instructions   ? amLODIPine (NORVASC) 10 mg, Oral, Daily   ? aspirin 81 mg, Oral, Daily   ? atorvastatin (Lipitor) 80 MG tablet 1 Tablet 1 Tablet At Bedtime   ?  busPIRone (BUSPAR) 5 mg, Oral, 3 times daily PRN   ? cinacalcet (SENSIPAR) 30 mg, Oral, Daily   ? cyanocobalamin 1000 MCG tablet TAKE 1 TABLET BY MOUTH ONCE A DAY FOR LOW VITAMIN BEFORE-12.   ? lisinopril (PRINIVIL, ZESTRIL) 10 mg, Oral, Daily   ? metoprolol tartrate (LOPRESSOR) 100 mg, Oral, 2 times daily   ? mirtazapine (Remeron) 15 MG tablet 1 tablet, Oral       SOCIAL HISTORY   She reports that she has quit smoking. She has never used smokeless tobacco. She reports that she does not currently use alcohol. She reports that she does not use drugs.    LABS   Lab Results   Component Value Date    WBC 8.4 04/16/2021    HGB 10.7 (L) 04/16/2021    HCT 33.5 (L) 04/16/2021    MCV 91.5 04/16/2021    PLT 205 04/16/2021     Lab Results   Component Value Date    NA 142 06/03/2022    K 4.1 06/03/2022    CO2 23 06/03/2022    CL 114 (H) 06/03/2022    BUN 19 06/03/2022    CREATININE 1.72 (H) 06/03/2022    MG 1.6 (L) 06/03/2022    CALCIUM 9.1 06/03/2022     Lab Results   Component Value Date    TROPONINI <0.03 10/04/2019     Lab Results   Component Value Date    PROBNP 714 (H) 06/03/2022    BNP 19 10/04/2019     Lab Results   Component Value Date    CHOL 157 06/03/2022    LDLCALC 93 06/03/2022    HDL 46 06/03/2022    TRIG 100 06/03/2022     Lab Results   Component Value Date    TSH 0.69 05/31/2017     No results found for: "HGBA1C"  EKG:   Encounter Date: 06/03/22   ECG 12 lead - Clinic Performed   Result Value    Vent Rate 63    Atrial Rate 63    PR Interval 152    QRS Duration 70    QT/QTc 402    QTc Calculation 411    P-Axis 75    R-Axis -10    T-Axis 19    Narrative    Normal sinus rhythm    Confirmed by Corey Skains ([id number]) on 06/04/2022 9:05:24 AM       Electronically signed by Marquita Palms, MD at 06/06/2022  1:12 PM EDT

## 2022-06-06 NOTE — Telephone Encounter (Signed)
Formatting of this note might be different from the original.  ----- Message from Marquita Palms, MD sent at 06/06/2022  1:11 PM EDT -----  Please let the patient know that her LDL is still a bit high at 93 (target <70).  She should continue lipitor 80 mg daily, and I sent her a new Rx for zetia 10 mg daily.  Thanks,  IT  Electronically signed by Orlena Sheldon, RN at 06/06/2022  1:35 PM EDT

## 2022-06-06 NOTE — Telephone Encounter (Signed)
Formatting of this note might be different from the original.  Called patient  No answer  LM for patient to call us back    Electronically signed by Orlena Sheldon, RN at 06/06/2022  1:36 PM EDT

## 2022-06-07 NOTE — Telephone Encounter (Signed)
Formatting of this note might be different from the original.  Called patient  No answer  LM for patient to call us back    Electronically signed by Orlena Sheldon, RN at 06/07/2022 10:34 AM EDT

## 2022-06-07 NOTE — Telephone Encounter (Signed)
Formatting of this note might be different from the original.  Called and talked to patient  Advised of results and recommendations  Patient verbalized understanding  Electronically signed by Orlena Sheldon, RN at 06/07/2022  3:32 PM EDT

## 2022-06-07 NOTE — Telephone Encounter (Signed)
Formatting of this note might be different from the original.  Pt calling to return missed call for lab results shes available for a call back   Electronically signed by Amil Amen at 06/07/2022  3:27 PM EDT

## 2022-08-21 ENCOUNTER — Inpatient Hospital Stay
Admit: 2022-08-21 | Discharge: 2022-08-21 | Disposition: A | Discharge status: Home / Self Care | Payer: MEDICARE | Attending: Emergency Medicine

## 2022-08-21 DIAGNOSIS — M5412 Radiculopathy, cervical region: Secondary | ICD-10-CM

## 2022-08-21 MED ORDER — LIDOCAINE 5 % EX PTCH
5 % | MEDICATED_PATCH | Freq: Every day | CUTANEOUS | 0 refills | Status: DC
Start: 2022-08-21 — End: 2022-08-21

## 2022-08-21 MED ORDER — LIDOCAINE 5 % EX PTCH
5 % | MEDICATED_PATCH | Freq: Every day | CUTANEOUS | 0 refills | Status: AC
Start: 2022-08-21 — End: 2022-09-10

## 2022-08-21 MED ORDER — METHYLPREDNISOLONE 4 MG PO TBPK
4 MG | PACK | ORAL | 0 refills | Status: DC
Start: 2022-08-21 — End: 2022-08-21

## 2022-08-21 MED ORDER — CYCLOBENZAPRINE HCL 5 MG PO TABS
5 MG | ORAL_TABLET | Freq: Three times a day (TID) | ORAL | 0 refills | Status: AC | PRN
Start: 2022-08-21 — End: 2022-08-28

## 2022-08-21 MED ORDER — CYCLOBENZAPRINE HCL 5 MG PO TABS
5 MG | ORAL_TABLET | Freq: Three times a day (TID) | ORAL | 0 refills | Status: DC | PRN
Start: 2022-08-21 — End: 2022-08-21

## 2022-08-21 MED ORDER — METHYLPREDNISOLONE 4 MG PO TBPK
4 MG | PACK | ORAL | 0 refills | Status: AC
Start: 2022-08-21 — End: 2022-08-27

## 2022-08-21 NOTE — ED Triage Notes (Signed)
Patient presents to ED with c/o right shoulder pain for 1 week, denies injury. States that she saw pcp on Friday and was prescribed medication

## 2022-08-21 NOTE — ED Notes (Signed)
Pt given discharge papers. Three E prescriptions for cyclobenzaprine (FLEXERIL) 5 MG tablet;lidocaine (LIDODERM) 5 %;methylPREDNISolone (MEDROL, PAK,) 4 MG tablet  sent to patients pharmacy. Pt educated on discharge papers and prescriptions. Pt instructed to follow up with Ortho. Pt verbalizes understanding and denies any further questions. Pt ambulated to waiting room with steady gait, alert and oriented x4.

## 2022-08-21 NOTE — ED Notes (Addendum)
Pt presents ambulatory to ED complaining of pain in right shoulder ; onset one week ago. Pt denies any injury to shoulder prior to symptoms. On assessment, radial pulse is 2+; cap refill < 3 secs in right arm.\    Pt is alert and oriented x 4, RR even and unlabored, skin is warm and dry. Assesment completed and pt updated on plan of care.       Emergency Department Nursing Plan of Care       The Nursing Plan of Care is developed from the Nursing assessment and Emergency Department Attending provider initial evaluation.  The plan of care may be reviewed in the "ED Provider note".    The Plan of Care was developed with the following considerations:   Patient / Family readiness to learn indicated LG:[id number] understanding  Persons(s) to be included in education: patient  Barriers to Learning/Limitations:None    Signed     Chundra Sauerwein, RN    08/21/2022   10:56 AM

## 2022-08-21 NOTE — ED Provider Notes (Signed)
[facility] EMERGENCY DEPT  EMERGENCY DEPARTMENT ENCOUNTER       Pt Name: Suzanne Sherman  MRN: [id number]  [place] 1956-03-29  Date of evaluation: 08/21/2022  Provider: Barnett Abu, PA   PCP: Daine Gip, APRN - NP  Note Started: 10:40 AM EST 08/21/22     CHIEF COMPLAINT       Chief Complaint   Patient presents with    Shoulder Pain        HISTORY OF PRESENT ILLNESS: 1 or more elements      History From: Patient  HPI Limitations: None     Suzanne Sherman is a 67 y.o. female w with a history of CAD status post stents who presents to the ED with neck and right upper extremity pain.  This began spontaneously 1 week ago with no inciting injury or trauma.  It is worse with movement of the neck.  She feels a radiating pain originating from the right side of her neck down the lateral aspect of her arm.  She has not taken anything for this yet.  She denies any other acute associated symptoms to include dizziness, headache, vision changes, chest pain, shortness of breath, sore throat, diaphoresis, nausea, vomiting, diarrhea, abdominal pain, fevers, rash, swelling.     Nursing Notes were all reviewed and agreed with or any disagreements were addressed in the HPI.     REVIEW OF SYSTEMS      Review of Systems     Positives and Pertinent negatives as per HPI.    PAST HISTORY     Past Medical History:  Past Medical History:   Diagnosis Date    HBP (high blood pressure) 10/27/2008    High cholesterol 10/27/2008       Past Surgical History:  No past surgical history on file.    Family History:  No family history on file.    Social History:  Social History     Tobacco Use    Smoking status: Every Day    Smokeless tobacco: Never   Substance Use Topics    Alcohol use: No    Drug use: No       Allergies:  Not on File    CURRENT MEDICATIONS      Previous Medications    ASPIRIN 81 MG CHEWABLE TABLET    Take by mouth daily    ATORVASTATIN (LIPITOR) 20 MG TABLET    Take by mouth daily    BUPROPION (WELLBUTRIN SR) 150 MG EXTENDED RELEASE TABLET     TAKE ONE TABLET BY MOUTH EVERY DAY IN THE MORNING FOR 3 DAYS , THEN TAKE ONE TABLET BY MOUTH EVERY DAY IN THE MORNING AND TAKE ONE TABLET BY    BUSPIRONE (BUSPAR) 10 MG TABLET    TK 1 T PO BID PRF ANXIETY    HYDROCHLOROTHIAZIDE (HYDRODIURIL) 25 MG TABLET    Take by mouth daily    METOPROLOL TARTRATE (LOPRESSOR) 50 MG TABLET    Take by mouth 2 times daily    MIRTAZAPINE (REMERON) 15 MG TABLET    TAKE ONE TABLET BY MOUTH EVERY DAY AT BEDTIME       SCREENINGS               No data recorded        PHYSICAL EXAM      ED Triage Vitals [08/21/22 0953]   Enc Vitals Group      BP (!) 144/79      Pulse 76  Respirations 16      Temp 98.1 F (36.7 C)      Temp Source Oral      SpO2 96 %      Weight - Scale 59 kg (130 lb)      Height 1.626 m (5\' 4" )      Head Circumference       Peak Flow       Pain Score       Pain Loc       Pain Edu?       Excl. in [place]?         Physical Exam  Vitals and nursing note reviewed.   Constitutional:       General: She is not in acute distress.     Appearance: Normal appearance. She is not ill-appearing, toxic-appearing or diaphoretic.      Comments: Well-appearing 67 year old female resting comfortably in the stretcher she is nontoxic nondiaphoretic   HENT:      Head: Normocephalic.      Right Ear: External ear normal.      Left Ear: External ear normal.      Nose: Nose normal. No congestion or rhinorrhea.      Mouth/Throat:      Mouth: Mucous membranes are moist.   Eyes:      Extraocular Movements: Extraocular movements intact.      Conjunctiva/sclera: Conjunctivae normal.      Pupils: Pupils are equal, round, and reactive to light.   Cardiovascular:      Rate and Rhythm: Normal rate and regular rhythm.      Pulses: Normal pulses.   Pulmonary:      Effort: Pulmonary effort is normal.      Breath sounds: Normal breath sounds.   Abdominal:      General: Abdomen is flat.      Palpations: Abdomen is soft.   Musculoskeletal:         General: Swelling present. Normal range of motion.      Cervical  back: Normal range of motion and neck supple.      Comments: Neck and upper extremities normal to inspection.  She has tenderness to palpation over the lateral cervical spine in the distribution of C5/C6 extending over the posterior shoulder.  Full range of motion at the shoulder elbow wrist and digits.  Sensation intact all dermatomes.  5/5 strength in all major muscle groups.  Less than 2-second capillary refill.  Positive Spurling sign .   Skin:     General: Skin is warm.      Capillary Refill: Capillary refill takes less than 2 seconds.      Findings: No erythema or rash.   Neurological:      General: No focal deficit present.      Mental Status: She is alert and oriented to person, place, and time. Mental status is at baseline.   Psychiatric:         Mood and Affect: Mood normal.         Behavior: Behavior normal.         Thought Content: Thought content normal.         Judgment: Judgment normal.          DIAGNOSTIC RESULTS   LABS:     No results found for this or any previous visit (from the past 24 hour(s)).      EKG: When ordered, EKG's are interpreted by the Emergency Department Physician in the absence of a  cardiologist.  Please see their note for interpretation of EKG.      RADIOLOGY:  Non-plain film images such as CT, Ultrasound and MRI are read by the radiologist. Plain radiographic images are visualized and preliminarily interpreted by the ED Provider with the below findings:          Interpretation per the Radiologist below, if available at the time of this note:     No results found.     PROCEDURES   Unless otherwise noted below, none  Procedures     CRITICAL CARE TIME       EMERGENCY DEPARTMENT COURSE and DIFFERENTIAL DIAGNOSIS/MDM   Vitals:    Vitals:    08/21/22 0953   BP: (!) 144/79   Pulse: 76   Resp: 16   Temp: 98.1 F (36.7 C)   TempSrc: Oral   SpO2: 96%   Weight: 59 kg (130 lb)   Height: 1.626 m (5\' 4" )        Patient was given the following medications:  Medications - No data to  display    CONSULTS: (Who and What was discussed)  None    Chronic Conditions: CAD    Social Determinants affecting Dx or Tx: None    Records Reviewed (source and summary of external records): Nursing Notes    CC/HPI Summary, DDx, ED Course, and Reassessment: Patient evaluated for atraumatic right-sided neck pain radiating to her right arm for 1 week.  She presented to the ED afebrile, nontoxic with no concerning vital sign abnormalities.  Patient's symptoms are most consistent with a likely radiculopathy originating from her cervical spine versus trapezius strain.  She has no features concerning for atypical cardiac issues, gallbladder involvement, and thus we will defer EKG or blood work.  Patient will be treated symptomatically with Medrol, Flexeril and lidocaine patches.  Will avoid NSAIDs given her cardiac history.  She was advised on the need for following up with a spine specialist for further care and evaluation.  She will return to the ED for any concerning or worsening symptoms.  Patient happy with this plan expressed understanding.             FINAL IMPRESSION     1. Cervical radiculopathy          DISPOSITION/PLAN   DISPOSITION Decision To Discharge 08/21/2022 10:39:39 AM    Discharge Note: The patient is stable for discharge home. The signs, symptoms, diagnosis, and discharge instructions have been discussed, understanding conveyed, and agreed upon. The patient is to follow up as recommended or return to ER should their symptoms worsen.      PATIENT [place]  [address]  [place] [address]  [phone]    Schedule an appointment as soon as possible for a visit       [address]  [place] [address]  [phone]    Schedule an appointment as soon as possible for a visit          DISCHARGE MEDICATIONS:     Medication List        START taking these medications      cyclobenzaprine 5 MG tablet  Commonly known as:  FLEXERIL  Take 1 tablet by mouth 3 times daily as needed for Muscle spasms     lidocaine 5 %  Commonly known as: LIDODERM  Place 1 patch onto the skin daily for 20 days 12 hours on, 12 hours  off.     methylPREDNISolone 4 MG tablet  Commonly known as: MEDROL (PAK)  Take by mouth.            ASK your doctor about these medications      aspirin 81 MG chewable tablet     atorvastatin 20 MG tablet  Commonly known as: LIPITOR     buPROPion 150 MG extended release tablet  Commonly known as: WELLBUTRIN SR     busPIRone 10 MG tablet  Commonly known as: BUSPAR     hydroCHLOROthiazide 25 MG tablet  Commonly known as: HYDRODIURIL     metoprolol tartrate 50 MG tablet  Commonly known as: LOPRESSOR     mirtazapine 15 MG tablet  Commonly known as: REMERON               Where to Get Your Medications        These medications were sent to [facility] [place] [phone]  [place], [place] [address]      Phone: [phone]   cyclobenzaprine 5 MG tablet  lidocaine 5 %  methylPREDNISolone 4 MG tablet           DISCONTINUED MEDICATIONS:  Current Discharge Medication List        I have seen and evaluated the patient autonomously. My supervision physician was on site and available for consultation if needed.     I am the Primary Clinician of Record.   Barnett Abu, PA (electronically signed)    (Please note that parts of this dictation were completed with voice recognition software. Quite often unanticipated grammatical, syntax, homophones, and other interpretive errors are inadvertently transcribed by the computer software. Please disregards these errors. Please excuse any errors that have escaped final proofreading.)      Barnett Abu, [place]  08/21/22 1113

## 2022-09-09 ENCOUNTER — Encounter

## 2022-09-23 ENCOUNTER — Ambulatory Visit: Payer: MEDICARE | Primary: Family

## 2022-10-07 ENCOUNTER — Ambulatory Visit: Payer: MEDICARE | Primary: Family

## 2022-11-07 ENCOUNTER — Ambulatory Visit: Payer: MEDICARE | Primary: Family

## 2022-12-13 ENCOUNTER — Inpatient Hospital Stay: Admit: 2022-12-13 | Payer: MEDICARE | Primary: Family

## 2022-12-13 DIAGNOSIS — Z1231 Encounter for screening mammogram for malignant neoplasm of breast: Secondary | ICD-10-CM

## 2023-01-17 ENCOUNTER — Encounter

## 2023-06-26 ENCOUNTER — Encounter

## 2023-07-05 ENCOUNTER — Ambulatory Visit: Payer: MEDICARE | Primary: Family

## 2023-11-14 ENCOUNTER — Ambulatory Visit: Payer: MEDICARE | Primary: Family

## 2023-12-06 ENCOUNTER — Encounter

## 2024-01-09 ENCOUNTER — Ambulatory Visit: Payer: Medicare (Managed Care) | Primary: Family

## 2024-04-02 ENCOUNTER — Encounter

## 2024-05-09 ENCOUNTER — Ambulatory Visit: Payer: Medicare (Managed Care) | Primary: Family

## 2024-07-28 ENCOUNTER — Emergency Department: Admit: 2024-07-28 | Payer: Medicare (Managed Care) | Primary: Family

## 2024-07-28 ENCOUNTER — Inpatient Hospital Stay
Admit: 2024-07-28 | Discharge: 2024-07-28 | Disposition: A | Discharge status: Home / Self Care | Payer: Medicare (Managed Care) | Arrived: AM

## 2024-07-28 DIAGNOSIS — I10 Essential (primary) hypertension: Principal | ICD-10-CM

## 2024-07-28 LAB — CBC WITH AUTO DIFFERENTIAL
Basophils %: 0.8 % (ref 0.0–1.0)
Basophils Absolute: 0.06 K/UL (ref 0.00–0.10)
Eosinophils %: 4 % (ref 0.0–7.0)
Eosinophils Absolute: 0.31 K/UL (ref 0.00–0.40)
Hematocrit: 37.7 % (ref 35.0–47.0)
Hemoglobin: 11.8 g/dL (ref 11.5–16.0)
Immature Granulocytes %: 0.5 % (ref 0.0–0.5)
Immature Granulocytes Absolute: 0.04 K/UL (ref 0.00–0.04)
Lymphocytes %: 25.1 % (ref 12.0–49.0)
Lymphocytes Absolute: 1.95 K/UL (ref 0.80–3.50)
MCH: 28.9 pg (ref 26.0–34.0)
MCHC: 31.3 g/dL (ref 30.0–36.5)
MCV: 92.4 FL (ref 80.0–99.0)
MPV: 10 FL (ref 8.9–12.9)
Monocytes %: 9.4 % (ref 5.0–13.0)
Monocytes Absolute: 0.73 K/UL (ref 0.00–1.00)
Neutrophils %: 60.2 % (ref 32.0–75.0)
Neutrophils Absolute: 4.69 K/UL (ref 1.80–8.00)
Nucleated RBCs: 0 /100{WBCs}
Platelets: 182 K/uL (ref 150–400)
RBC: 4.08 M/uL (ref 3.80–5.20)
RDW: 17 % — ABNORMAL HIGH (ref 11.5–14.5)
WBC: 7.8 K/uL (ref 3.6–11.0)
nRBC: 0 K/uL (ref 0.00–0.01)

## 2024-07-28 LAB — BASIC METABOLIC PANEL
Anion Gap: 12 mmol/L (ref 2–14)
BUN/Creatinine Ratio: 11 — ABNORMAL LOW (ref 12–20)
BUN: 19 mg/dL (ref 8–23)
CO2: 23 mmol/L (ref 20–29)
Calcium: 9.8 mg/dL (ref 8.8–10.2)
Chloride: 107 mmol/L (ref 98–107)
Creatinine: 1.7 mg/dL — ABNORMAL HIGH (ref 0.60–1.00)
Est, Glom Filt Rate: 32 ml/min/1.73m2 — ABNORMAL LOW (ref >59)
Glucose: 109 mg/dL — ABNORMAL HIGH (ref 65–100)
Potassium: 4.1 mmol/L (ref 3.5–5.1)
Sodium: 141 mmol/L (ref 136–145)

## 2024-07-28 LAB — MAGNESIUM: Magnesium: 1.8 mg/dL (ref 1.6–2.4)

## 2024-07-28 LAB — EXTRA TUBES HOLD

## 2024-07-28 LAB — TROPONIN: Troponin T: 11.2 ng/L (ref 0–14)

## 2024-07-28 MED ORDER — LIDOCAINE 4 % EX PTCH
4 | CUTANEOUS | Status: DC
Start: 2024-07-28 — End: 2024-07-28
  Administered 2024-07-28: 16:00:00 1 via TRANSDERMAL

## 2024-07-28 MED ORDER — LIDOCAINE 4 % EX PTCH
4 | MEDICATED_PATCH | Freq: Every day | CUTANEOUS | 0 refills | Status: AC
Start: 2024-07-28 — End: 2024-08-27

## 2024-07-28 MED ORDER — ACETAMINOPHEN 500 MG PO TABS
500 | ORAL | Status: AC
Start: 2024-07-28 — End: 2024-07-28
  Administered 2024-07-28: 16:00:00 1000 mg via ORAL

## 2024-07-28 MED FILL — SALONPAS PAIN RELIEVING 4 % EX PTCH: 4 % | CUTANEOUS | Qty: 1

## 2024-07-28 MED FILL — ACETAMINOPHEN EXTRA STRENGTH 500 MG PO TABS: 500 mg | ORAL | Qty: 2

## 2024-07-28 NOTE — ED Notes (Signed)
"  Pt presents to ED via EMS complaining of CP that radiates down her left shoulder x several days. Pt denies known injury. Pt has hx of HTN and is med compliant on Lisinopril. Per EMS pt was given 4 baby aspirin in route. EMS states when they arrived in the ED parking lot the pt was not responding to questions asked by EMS and per EMS the monitor showed a HR of 200 BPM. Unknown if this even was artifact. MD and primary at bedside. EKG 12-lead ran normal sinus rhythm. IV lines established and blood work sent.  Pt is alert and oriented x 4, RR even and unlabored, skin is warm and dry. Pt appears in NAD at this time. Assessment completed and pt updated on plan of care.  Call bell in reach.    Emergency Department Nursing Plan of Care  The Nursing Plan of Care is developed from the Nursing assessment and Emergency Department Attending provider initial evaluation.  The plan of care may be reviewed in the ED Provider note.      The Plan of Care was developed with the following considerations:  Patient / Family readiness to learn indicated ab:Mzqzm to Medical chart in Epic  Persons(s) to be included in education: Refer to Medical chart in Epic  Barriers to Learning/Limitations:Normal     Signed    Pleasant Cones, RN  07/28/2024 10:27 AM    "

## 2024-07-28 NOTE — ED Notes (Signed)
"  Discharge instructions were given to the patient by Alfonso Lyle Pepper, RN .     The patient left the Emergency Department ambulatory, alert and oriented and in no acute distress with 1 prescriptions. The patient was encouraged to call or return to the ED for worsening issues or problems and was encouraged to schedule a follow up appointment for continuing care. Patient discharged home ambulatory with daughter.    The patient verbalized understanding of discharge instructions and prescriptions, all questions were answered. The patient has no further concerns at this time.     "

## 2024-07-28 NOTE — ED Provider Notes (Signed)
 "    [facility]  EMERGENCY DEPARTMENT ENCOUNTER    Patient Name: Suzanne Sherman  MRN: [id number]  Birth Date: February 24, 1956  PCP: Henry Liming, APRN - NP    History of Presenting Illness     Chief Complaint   Patient presents with    Chest Pain    Shoulder Pain     History Provided by: patient, EMS    HISTORY (Narrative):   Suzanne Sherman is a 68 y.o. female with a history of coronary artery disease, hypertension, dyslipidemia, and chronic kidney disease stage 3 (per chart review) who presents due to concern for left shoulder pain today.  The pain is located throughout the left shoulder joint, and exacerbated by movement.  It does not occur when her arm is at rest. She denies any recent trauma to the shoulder, neck pain, or history of arthritis that she is aware of.     During EMS transport, they report she suddenly developed transient episode of tachycardia on the EMS monitor, but on review of the EMS telemetry strips, appears to be significant artifact, and in lead I, it still shows sinus rhythm.  Patient reports she remembers this whole episode, and did not feel differently.  She denies chest pain, shortness of breath, cough, fever, sore throat, or leg swelling recently.     Current outpatient meds:  amLODIPine (NORVASC) 5 MG tablet, Take 5 mg by mouth 1 (one) time each day, Disp: , Rfl:   atorvastatin (LIPITOR) 80 MG tablet, 1 Tablet 1 Tablet At Bedtime, Disp: 90, Rfl: 0  Cyanocobalamin ([employer] B12 Quick Dissolve) 5000 MCG lozenge, 1 1 Once A Day, Disp: 1, Rfl: 0  ezetimibe (ZETIA) 10 MG tablet, Take 10 mg by mouth 1 (one) time each day, Disp: , Rfl:   metoprolol tartrate (LOPRESSOR) 100 MG tablet, 1 Tablet 1 Tablet Twice A Day, Disp: 60, Rfl: 0  sertraline (ZOLOFT) 50 MG tablet, 1 1 Once A Day, Disp: 1, Rfl: 0  sodium bicarbonate 325 MG tablet, Take 1 tablet (325 mg total) by mouth in the morning and 1 tablet (325 mg total) in the evening., Disp: 60 tablet, Rfl: 6  lisinopril (PRINIVIL)  40 MG tablet, Take 1 tablet (40 mg total) by mouth 1 (one) time eac      Nursing Notes were all reviewed and agreed with or any disagreements were addressed in the HPI.    Review of Systems     Other pertinent positives and negatives listed above in HPI.    Past History     PAST MEDICAL HISTORY:  Past Medical History:   Diagnosis Date    HBP (high blood pressure) 10/27/2008    High cholesterol 10/27/2008       PAST SURGICAL HISTORY:  No past surgical history on file.    FAMILY HISTORY:  No family history on file.    SOCIAL HISTORY:  Social History     Tobacco Use    Smoking status: Every Day    Smokeless tobacco: Never   Substance Use Topics    Alcohol use: No    Drug use: No       MEDICATIONS:  No current facility-administered medications on file prior to encounter.     Current Outpatient Medications on File Prior to Encounter   Medication Sig Dispense Refill    aspirin 81 MG chewable tablet Take by mouth daily      atorvastatin (LIPITOR) 20 MG tablet Take by mouth daily  buPROPion (WELLBUTRIN SR) 150 MG extended release tablet TAKE ONE TABLET BY MOUTH EVERY DAY IN THE MORNING FOR 3 DAYS , THEN TAKE ONE TABLET BY MOUTH EVERY DAY IN THE MORNING AND TAKE ONE TABLET BY      busPIRone (BUSPAR) 10 MG tablet TK 1 T PO BID PRF ANXIETY      hydroCHLOROthiazide (HYDRODIURIL) 25 MG tablet Take by mouth daily      metoprolol tartrate (LOPRESSOR) 50 MG tablet Take by mouth 2 times daily      mirtazapine (REMERON) 15 MG tablet TAKE ONE TABLET BY MOUTH EVERY DAY AT BEDTIME         ALLERGIES:  No Known Allergies    SOCIAL DETERMINANTS OF HEALTH:  Social Drivers of Health     Tobacco Use: Medium Risk (06/17/2024)    Received from [facility]    Patient History     Smoking Tobacco Use: Former     Smokeless Tobacco Use: Never     Passive Exposure: Not on file   Alcohol Use: Not At Risk (08/21/2022)    AUDIT-C     Frequency of Alcohol Consumption: Never     Average Number of Drinks: Patient does not drink     Frequency  of Binge Drinking: Never   Financial Resource Strain: Not on file   Food Insecurity: Not on file   Transportation Needs: Not on file   Physical Activity: Not on file   Stress: Not on file   Social Connections: Not on file   Intimate Partner Violence: Not on file   Depression: Not on file   Housing Stability: Not on file   Interpersonal Safety: Not At Risk (08/21/2022)    Interpersonal Safety Domain Source: IP Abuse Screening     Physical abuse: Denies     Verbal abuse: Denies     Emotional abuse: Denies     Financial abuse: Denies     Sexual abuse: Denies   Utilities: Not on file       Physical Exam   Patient Vitals for the past 24 hrs:   BP Temp Temp src Pulse Resp SpO2 Height   07/28/24 1141 (!) 193/96 -- -- 57 12 100 % --   07/28/24 1032 (!) 220/93 -- -- 61 12 100 % --   07/28/24 1018 (!) 155/124 -- -- -- -- -- --   07/28/24 1016 (!) 155/124 -- -- 69 22 100 % --   07/28/24 1013 -- 97.8 F (36.6 C) Oral -- -- -- --   07/28/24 1011 -- -- -- -- -- -- 1.651 m (5' 5)   07/28/24 1010 -- -- -- 66 23 100 % --       Physical Exam  Vitals and nursing note reviewed.   Constitutional:       Appearance: Normal appearance. She is not ill-appearing or diaphoretic.   HENT:      Nose: No congestion or rhinorrhea.   Eyes:      General: No scleral icterus.     Conjunctiva/sclera: Conjunctivae normal.   Cardiovascular:      Rate and Rhythm: Normal rate and regular rhythm.   Pulmonary:      Effort: Pulmonary effort is normal. No respiratory distress.   Abdominal:      General: There is no distension.      Palpations: Abdomen is soft.   Musculoskeletal:      Left shoulder: Normal. No swelling, deformity, effusion or bony tenderness. Normal strength.  Cervical back: Neck supple. No rigidity.      Right lower leg: No edema.      Left lower leg: No edema.      Comments: Normal left 2+ radial pulse. Normal sensation throughout the LUE   Skin:     General: Skin is warm and dry.   Neurological:      General: No focal deficit present.       Mental Status: She is alert.   Psychiatric:         Mood and Affect: Mood normal.         Behavior: Behavior normal.         Diagnostic Study Results     LABS:  Results for orders placed or performed during the hospital encounter of 07/28/24   Extra Tubes Hold   Result Value Ref Range    Specimen HOld        Add-on orders for these samples will be processed based on acceptable specimen integrity and analyte stability, which may vary by analyte.    Comment:        Add-on orders for these samples will be processed based on acceptable specimen integrity and analyte stability, which may vary by analyte.   BMP   Result Value Ref Range    Sodium 141 136 - 145 mmol/L    Potassium 4.1 3.5 - 5.1 mmol/L    Chloride 107 98 - 107 mmol/L    CO2 23 20 - 29 mmol/L    Anion Gap 12 2 - 14 mmol/L    Glucose 109 (H) 65 - 100 mg/dL    BUN 19 8 - 23 MG/DL    Creatinine 8.29 (H) 0.60 - 1.00 MG/DL    BUN/Creatinine Ratio 11 (L) 12 - 20      Est, Glom Filt Rate 32 (L) >59 ml/min/1.23m2    Calcium 9.8 8.8 - 10.2 MG/DL   CBC with Auto Differential   Result Value Ref Range    WBC 7.8 3.6 - 11.0 K/uL    RBC 4.08 3.80 - 5.20 M/uL    Hemoglobin 11.8 11.5 - 16.0 g/dL    Hematocrit 62.2 64.9 - 47.0 %    MCV 92.4 80.0 - 99.0 FL    MCH 28.9 26.0 - 34.0 PG    MCHC 31.3 30.0 - 36.5 g/dL    RDW 82.9 (H) 88.4 - 14.5 %    Platelets 182 150 - 400 K/uL    MPV 10.0 8.9 - 12.9 FL    Nucleated RBCs 0.0 0 PER 100 WBC    nRBC 0.00 0.00 - 0.01 K/uL    Neutrophils % 60.2 32.0 - 75.0 %    Lymphocytes % 25.1 12.0 - 49.0 %    Monocytes % 9.4 5.0 - 13.0 %    Eosinophils % 4.0 0.0 - 7.0 %    Basophils % 0.8 0.0 - 1.0 %    Immature Granulocytes % 0.5 0.0 - 0.5 %    Neutrophils Absolute 4.69 1.80 - 8.00 K/UL    Lymphocytes Absolute 1.95 0.80 - 3.50 K/UL    Monocytes Absolute 0.73 0.00 - 1.00 K/UL    Eosinophils Absolute 0.31 0.00 - 0.40 K/UL    Basophils Absolute 0.06 0.00 - 0.10 K/UL    Immature Granulocytes Absolute 0.04 0.00 - 0.04 K/UL    Differential Type  AUTOMATED     Magnesium   Result Value Ref Range    Magnesium 1.8 1.6 - 2.4 mg/dL  Troponin   Result Value Ref Range    Troponin T 11.2 0 - 14 ng/L       EKG  EKG independently reviewed by me.   Normal sinus rhythm.  Rate 68 bpm.  Normal axis.  No ST elevations or depressions.  No T wave inversions.  Overall normal EKG.  Gracelyn Donath, MD  All EKGs independently interpreted by me, if performed.    RADIOLOGIC STUDIES:   Non x-ray images such as CT, Ultrasound and MRI are read by the radiologist. X-ray images are visualized and preliminarily interpreted by the ED Provider with the findings as listed in the ED Course section below.     Interpretation per the Radiologist is listed below, if available at the time of this note:    XR SHOULDER LEFT (MIN 2 VIEWS)   Final Result   No acute abnormality.      Electronically signed by DARICE COLON          SCREENINGS                         Differential Diagnosis/MDM     Suzanne Sherman is a 68 y.o. female with hx per HPI, who presents with acute atraumatic left shoulder pain, which is suspected to be of musculoskeletal origin. The pain is primarily elicited with movement and is absent when the shoulder is at rest, making musculoskeletal pathology the most likely diagnosis.  There is no erythema, edema, osseous tenderness, or other obvious abnormality of the joint on exam.  She is neurovascularly intact throughout the left upper extremity.    Given her medical history of coronary artery disease (CAD), chronic kidney disease (CKD), and hyperlipidemia (HLD), considered the possibility of atypical acute coronary syndrome (ACS) despite the fact that she has no chest pain, shortness of breath, exertional symptoms, nor nausea.  Her pain is strictly left arm motion-dependent and not occurring at rest, so suspect ACS is unlikely. The absence of associated symptoms such as chest pain, shortness of breath, or diaphoresis further reduces the likelihood of an acute cardiac event.   Although EMS reported transient significant tachycardia and route, my review of the EMS telemetry strips indicates she was still in normal sinus rhythm during this event, and patient reports feeling asymptomatic during it as well.  On arrival to the ED she is hypertensive at 155/124, but she follows closely with cardiology for this and is compliant with her medications.  She states she can call her primary care doctor and get in within the week. She is without signs of htn emergency including no signs of encephalopathy, stroke, vision changes, ACS, dissection (normal, equal radial pulses), pulmonary edema, or renal failure, or other signs of end organ damage on thorough history, exam. Discussed hypertension with patient and recommended close outpatient follow-up with PCP or cardiologist.     The plan today includes basic labs, including a complete metabolic panel, to rule out electrolyte disturbances or other metabolic issues that could contribute to her symptoms, as well as cardiac markers to ensure there is no ongoing ischemic event. We will also obtain a shoulder X-ray to evaluate for any signs of occult fracture, arthritis, joint space narrowing, or other structural abnormalities in the shoulder joint. There are no signs of systemic infection such as fever, erythema, or edema, which makes septic joint unlikely. Based on the current presentation, a musculoskeletal etiology remains the most probable diagnosis, and further workup will help confirm this.  Will empirically treat with Tylenol  and a lidocaine  patch and reassess after labs and imaging for disposition.    I discussed each of these tests and considerations with the patient and answered questions to the best of my ability.    Records Reviewed with summary (prior medical records and Nursing notes)  Nursing Notes, Old Medical Records, ED Notes from Outside Facility (Separate Group), Clinical Records from a Different Specialty (cardiology), Previous Radiology  Studies, and Previous Laboratory Studies    10:34 AM EST ED Course, and Reassessment:  ED COURSE  ED Course as of 07/28/24 1153   Sun Jul 28, 2024   1036 Hemoglobin Quant: 11.8 [LH]   1036 WBC: 7.8 [LH]   1053 Troponin T: 11.2 [LH]   1053 Magnesium: 1.8 [LH]   1111 XR SHOULDER LEFT (MIN 2 VIEWS)     FINDINGS: Three views of the left shoulder demonstrate no fracture, dislocation  or other acute abnormality.     IMPRESSION:  No acute abnormality. [LH]   1149 Patient reports significant improvement in left shoulder pain, now only mild and much better. All labs are within normal limits, with no evidence of cardiac etiology for symptoms and normal electrolytes. Blood pressure remains elevated at 195/80s. Left shoulder x-ray is unremarkable, showing no fracture or dislocation. Pain is likely due to muscle soreness, pinched nerve, early arthritis, or other MSK etiology. Recommended OTC meds and lidocaine  patches and instricted on use.  The importance of blood pressure follow-up with primary care and cardiology was emphasized, as persistently high blood pressure can worsen kidney function and cause other complications. No emergent intervention is required at this time, and the patient was instructed to return if symptoms worsen. [LH]      ED Course User Index  [LH] Rockie Gracelyn LABOR, MD       Social Determinants affecting Diagnosis/Treatment: Patient has concerns for health literacy. Additional time provided in explanation    Procedures/Critical Care     Procedures    ED FINAL IMPRESSION     1. Nontraumatic pain of left shoulder    2. Essential hypertension    3. History of CAD (coronary artery disease)        DISPOSITION/PLAN     Discharge Note: The patient is stable for discharge home. The signs, symptoms, diagnosis, and discharge instructions have been discussed, understanding conveyed, and agreed upon. The patient is to follow up as recommended or return to ER should their symptoms worsen.    (Please note that parts of  this dictation were completed with voice recognition software. Quite often unanticipated grammatical, syntax, homophones, and other interpretive errors are inadvertently transcribed by the computer software. Please disregards these errors. Please excuse any errors that have escaped final proofreading.)    I am the Primary Clinician of Record.   Gracelyn Rockie, MD  (Electronically signed)           Rockie Gracelyn LABOR, MD  07/28/24 1153    "

## 2024-07-28 NOTE — Discharge Instructions (Addendum)
"  You were seen in the ED for your left shoulder pain.  No emergent causes of your pain were discovered.  You may take Tylenol  1000 mg every 6-8 hours as needed for pain.  You can also try the lidocaine  patches which we have sent to your pharmacy.    Your blood pressure was very high in the ER, 193/96.  We checked your cardiac labs to make sure this was not causing your shoulder pain, and it did not seem to be.    Please call your primary care doctor tomorrow to schedule follow-up within the next few days for reassessment of your shoulder pain and high blood pressure.  "

## 2024-07-28 NOTE — ED Notes (Signed)
"  Discharge instructions were given to the patient by Pleasant, RN.     The patient left the Emergency Department alert and oriented and in no acute distress with prescriptions. The patient was encouraged to call or return to the ED for worsening issues or problems and was encouraged to schedule a follow up appointment for continuing care.     Ambulation assessment completed before discharge.  Pt left Emergency Department ambulating at baseline with no ortho devices  Ortho device education: none    The patient verbalized understanding of discharge instructions and prescriptions, all questions were answered. The patient has no further concerns at this time.      "
